# Patient Record
Sex: Female | Born: 1940
Health system: Southern US, Community
[De-identification: ages and names within clinical notes are randomized; demographics above are authoritative.]

## PROBLEM LIST (undated history)

## (undated) DIAGNOSIS — N189 Chronic kidney disease, unspecified: Secondary | ICD-10-CM

## (undated) DIAGNOSIS — L309 Dermatitis, unspecified: Secondary | ICD-10-CM

## (undated) DIAGNOSIS — K5790 Diverticulosis of intestine, part unspecified, without perforation or abscess without bleeding: Secondary | ICD-10-CM

## (undated) DIAGNOSIS — F988 Other specified behavioral and emotional disorders with onset usually occurring in childhood and adolescence: Secondary | ICD-10-CM

## (undated) DIAGNOSIS — E785 Hyperlipidemia, unspecified: Secondary | ICD-10-CM

## (undated) DIAGNOSIS — R251 Tremor, unspecified: Secondary | ICD-10-CM

## (undated) DIAGNOSIS — N183 Chronic kidney disease, stage 3 unspecified: Secondary | ICD-10-CM

## (undated) DIAGNOSIS — U071 COVID-19: Secondary | ICD-10-CM

## (undated) DIAGNOSIS — G43909 Migraine, unspecified, not intractable, without status migrainosus: Secondary | ICD-10-CM

## (undated) DIAGNOSIS — K219 Gastro-esophageal reflux disease without esophagitis: Secondary | ICD-10-CM

## (undated) DIAGNOSIS — M1990 Unspecified osteoarthritis, unspecified site: Secondary | ICD-10-CM

## (undated) DIAGNOSIS — G459 Transient cerebral ischemic attack, unspecified: Secondary | ICD-10-CM

## (undated) DIAGNOSIS — F419 Anxiety disorder, unspecified: Secondary | ICD-10-CM

## (undated) DIAGNOSIS — M797 Fibromyalgia: Secondary | ICD-10-CM

## (undated) DIAGNOSIS — F32A Depression, unspecified: Secondary | ICD-10-CM

## (undated) DIAGNOSIS — J45909 Unspecified asthma, uncomplicated: Secondary | ICD-10-CM

## (undated) DIAGNOSIS — G250 Essential tremor: Secondary | ICD-10-CM

## (undated) DIAGNOSIS — N2889 Other specified disorders of kidney and ureter: Secondary | ICD-10-CM

## (undated) DIAGNOSIS — I10 Essential (primary) hypertension: Secondary | ICD-10-CM

## (undated) DIAGNOSIS — R413 Other amnesia: Principal | ICD-10-CM

## (undated) DIAGNOSIS — F329 Major depressive disorder, single episode, unspecified: Secondary | ICD-10-CM

## (undated) DIAGNOSIS — J189 Pneumonia, unspecified organism: Secondary | ICD-10-CM

## (undated) DIAGNOSIS — K529 Noninfective gastroenteritis and colitis, unspecified: Secondary | ICD-10-CM

## (undated) HISTORY — DX: Unspecified osteoarthritis, unspecified site: M19.90

## (undated) HISTORY — DX: Transient cerebral ischemic attack, unspecified: G45.9

## (undated) HISTORY — DX: Other specified behavioral and emotional disorders with onset usually occurring in childhood and adolescence: F98.8

## (undated) HISTORY — DX: Dermatitis, unspecified: L30.9

## (undated) HISTORY — DX: COVID-19: U07.1

## (undated) HISTORY — DX: Anxiety disorder, unspecified: F41.9

## (undated) HISTORY — PX: TONSILLECTOMY AND ADENOIDECTOMY: SUR1326

## (undated) HISTORY — DX: Tremor, unspecified: R25.1

## (undated) HISTORY — PX: CATARACT EXTRACTION: SUR2

## (undated) HISTORY — DX: Essential tremor: G25.0

## (undated) HISTORY — DX: Other specified disorders of kidney and ureter: N28.89

## (undated) HISTORY — PX: ABDOMINAL HYSTERECTOMY: SHX81

## (undated) HISTORY — DX: Chronic kidney disease, stage 3 unspecified: N18.30

## (undated) HISTORY — DX: Chronic kidney disease, stage 3 (moderate): N18.3

## (undated) HISTORY — DX: Other amnesia: R41.3

## (undated) HISTORY — DX: Pneumonia, unspecified organism: J18.9

## (undated) HISTORY — PX: APPENDECTOMY: SHX54

---

## 1999-09-02 ENCOUNTER — Encounter: Admission: RE | Admit: 1999-09-02 | Discharge: 1999-09-02 | Payer: Self-pay | Admitting: Internal Medicine

## 1999-09-02 ENCOUNTER — Encounter: Payer: Self-pay | Admitting: Internal Medicine

## 1999-09-28 ENCOUNTER — Encounter: Payer: Self-pay | Admitting: Internal Medicine

## 1999-09-28 ENCOUNTER — Encounter: Admission: RE | Admit: 1999-09-28 | Discharge: 1999-09-28 | Payer: Self-pay | Admitting: Internal Medicine

## 2000-05-29 ENCOUNTER — Encounter: Admission: RE | Admit: 2000-05-29 | Discharge: 2000-05-29 | Payer: Self-pay | Admitting: Internal Medicine

## 2000-05-29 ENCOUNTER — Encounter: Payer: Self-pay | Admitting: Internal Medicine

## 2000-05-29 ENCOUNTER — Encounter (INDEPENDENT_AMBULATORY_CARE_PROVIDER_SITE_OTHER): Payer: Self-pay | Admitting: *Deleted

## 2000-08-10 ENCOUNTER — Ambulatory Visit (HOSPITAL_COMMUNITY): Admission: RE | Admit: 2000-08-10 | Discharge: 2000-08-10 | Payer: Self-pay | Admitting: Obstetrics and Gynecology

## 2000-08-10 ENCOUNTER — Encounter: Payer: Self-pay | Admitting: Obstetrics and Gynecology

## 2000-10-02 ENCOUNTER — Encounter: Payer: Self-pay | Admitting: Internal Medicine

## 2000-10-02 ENCOUNTER — Encounter: Admission: RE | Admit: 2000-10-02 | Discharge: 2000-10-02 | Payer: Self-pay | Admitting: Internal Medicine

## 2000-10-05 ENCOUNTER — Emergency Department (HOSPITAL_COMMUNITY): Admission: EM | Admit: 2000-10-05 | Discharge: 2000-10-05 | Payer: Self-pay | Admitting: Emergency Medicine

## 2000-10-05 ENCOUNTER — Encounter: Payer: Self-pay | Admitting: Emergency Medicine

## 2001-05-22 ENCOUNTER — Encounter (INDEPENDENT_AMBULATORY_CARE_PROVIDER_SITE_OTHER): Payer: Self-pay | Admitting: *Deleted

## 2001-05-22 ENCOUNTER — Ambulatory Visit (HOSPITAL_COMMUNITY): Admission: RE | Admit: 2001-05-22 | Discharge: 2001-05-22 | Payer: Self-pay | Admitting: Gastroenterology

## 2001-10-05 ENCOUNTER — Encounter: Admission: RE | Admit: 2001-10-05 | Discharge: 2001-10-05 | Payer: Self-pay | Admitting: Internal Medicine

## 2001-10-05 ENCOUNTER — Encounter: Payer: Self-pay | Admitting: Internal Medicine

## 2002-11-18 ENCOUNTER — Encounter: Payer: Self-pay | Admitting: Obstetrics and Gynecology

## 2002-11-18 ENCOUNTER — Encounter: Admission: RE | Admit: 2002-11-18 | Discharge: 2002-11-18 | Payer: Self-pay | Admitting: Obstetrics and Gynecology

## 2003-07-03 ENCOUNTER — Encounter (INDEPENDENT_AMBULATORY_CARE_PROVIDER_SITE_OTHER): Payer: Self-pay | Admitting: *Deleted

## 2003-07-03 ENCOUNTER — Encounter: Admission: RE | Admit: 2003-07-03 | Discharge: 2003-07-03 | Payer: Self-pay | Admitting: Internal Medicine

## 2003-08-12 ENCOUNTER — Other Ambulatory Visit: Admission: RE | Admit: 2003-08-12 | Discharge: 2003-08-12 | Payer: Self-pay | Admitting: Obstetrics and Gynecology

## 2003-12-22 ENCOUNTER — Ambulatory Visit (HOSPITAL_COMMUNITY): Admission: RE | Admit: 2003-12-22 | Discharge: 2003-12-22 | Payer: Self-pay | Admitting: Internal Medicine

## 2004-08-19 ENCOUNTER — Other Ambulatory Visit: Admission: RE | Admit: 2004-08-19 | Discharge: 2004-08-19 | Payer: Self-pay | Admitting: Obstetrics and Gynecology

## 2004-12-27 ENCOUNTER — Ambulatory Visit (HOSPITAL_COMMUNITY): Admission: RE | Admit: 2004-12-27 | Discharge: 2004-12-27 | Payer: Self-pay | Admitting: Internal Medicine

## 2005-01-14 ENCOUNTER — Encounter (INDEPENDENT_AMBULATORY_CARE_PROVIDER_SITE_OTHER): Payer: Self-pay | Admitting: *Deleted

## 2005-08-22 ENCOUNTER — Other Ambulatory Visit: Admission: RE | Admit: 2005-08-22 | Discharge: 2005-08-22 | Payer: Self-pay | Admitting: Obstetrics and Gynecology

## 2005-10-26 ENCOUNTER — Encounter: Admission: RE | Admit: 2005-10-26 | Discharge: 2005-10-26 | Payer: Self-pay | Admitting: Orthopedic Surgery

## 2005-12-19 ENCOUNTER — Encounter: Admission: RE | Admit: 2005-12-19 | Discharge: 2005-12-19 | Payer: Self-pay | Admitting: Orthopedic Surgery

## 2005-12-29 ENCOUNTER — Ambulatory Visit (HOSPITAL_COMMUNITY): Admission: RE | Admit: 2005-12-29 | Discharge: 2005-12-29 | Payer: Self-pay | Admitting: Internal Medicine

## 2006-01-18 ENCOUNTER — Encounter: Admission: RE | Admit: 2006-01-18 | Discharge: 2006-01-18 | Payer: Self-pay | Admitting: Orthopedic Surgery

## 2006-11-02 ENCOUNTER — Other Ambulatory Visit: Admission: RE | Admit: 2006-11-02 | Discharge: 2006-11-02 | Payer: Self-pay | Admitting: Obstetrics and Gynecology

## 2007-01-03 ENCOUNTER — Ambulatory Visit (HOSPITAL_COMMUNITY): Admission: RE | Admit: 2007-01-03 | Discharge: 2007-01-03 | Payer: Self-pay | Admitting: Obstetrics and Gynecology

## 2007-02-15 HISTORY — PX: TOTAL HIP ARTHROPLASTY: SHX124

## 2007-08-06 ENCOUNTER — Inpatient Hospital Stay (HOSPITAL_COMMUNITY): Admission: RE | Admit: 2007-08-06 | Discharge: 2007-08-09 | Payer: Self-pay | Admitting: Orthopedic Surgery

## 2007-10-30 ENCOUNTER — Ambulatory Visit (HOSPITAL_COMMUNITY): Admission: RE | Admit: 2007-10-30 | Discharge: 2007-10-30 | Payer: Self-pay | Admitting: Orthopedic Surgery

## 2007-12-18 ENCOUNTER — Inpatient Hospital Stay (HOSPITAL_COMMUNITY): Admission: RE | Admit: 2007-12-18 | Discharge: 2007-12-21 | Payer: Self-pay | Admitting: Orthopedic Surgery

## 2008-01-24 ENCOUNTER — Ambulatory Visit (HOSPITAL_COMMUNITY): Admission: RE | Admit: 2008-01-24 | Discharge: 2008-01-24 | Payer: Self-pay | Admitting: Obstetrics and Gynecology

## 2008-01-29 ENCOUNTER — Other Ambulatory Visit: Admission: RE | Admit: 2008-01-29 | Discharge: 2008-01-29 | Payer: Self-pay | Admitting: Obstetrics and Gynecology

## 2008-11-17 ENCOUNTER — Encounter: Admission: RE | Admit: 2008-11-17 | Discharge: 2008-11-17 | Payer: Self-pay | Admitting: Internal Medicine

## 2009-01-19 ENCOUNTER — Encounter: Admission: RE | Admit: 2009-01-19 | Discharge: 2009-01-19 | Payer: Self-pay | Admitting: Neurology

## 2009-01-26 ENCOUNTER — Ambulatory Visit (HOSPITAL_COMMUNITY): Admission: RE | Admit: 2009-01-26 | Discharge: 2009-01-26 | Payer: Self-pay | Admitting: Obstetrics and Gynecology

## 2009-03-05 ENCOUNTER — Ambulatory Visit: Payer: Self-pay | Admitting: Psychology

## 2009-03-10 ENCOUNTER — Other Ambulatory Visit: Admission: RE | Admit: 2009-03-10 | Discharge: 2009-03-10 | Payer: Self-pay | Admitting: Obstetrics and Gynecology

## 2009-09-21 ENCOUNTER — Encounter (INDEPENDENT_AMBULATORY_CARE_PROVIDER_SITE_OTHER): Payer: Self-pay | Admitting: *Deleted

## 2009-11-30 ENCOUNTER — Telehealth (INDEPENDENT_AMBULATORY_CARE_PROVIDER_SITE_OTHER): Payer: Self-pay | Admitting: *Deleted

## 2009-11-30 ENCOUNTER — Encounter (INDEPENDENT_AMBULATORY_CARE_PROVIDER_SITE_OTHER): Payer: Self-pay | Admitting: *Deleted

## 2009-12-08 ENCOUNTER — Ambulatory Visit: Payer: Self-pay | Admitting: Internal Medicine

## 2009-12-08 DIAGNOSIS — Z860100 Personal history of colon polyps, unspecified: Secondary | ICD-10-CM | POA: Insufficient documentation

## 2009-12-08 DIAGNOSIS — F341 Dysthymic disorder: Secondary | ICD-10-CM | POA: Insufficient documentation

## 2009-12-08 DIAGNOSIS — K219 Gastro-esophageal reflux disease without esophagitis: Secondary | ICD-10-CM | POA: Insufficient documentation

## 2010-01-28 ENCOUNTER — Ambulatory Visit (HOSPITAL_COMMUNITY)
Admission: RE | Admit: 2010-01-28 | Discharge: 2010-01-28 | Payer: Self-pay | Source: Home / Self Care | Attending: Obstetrics and Gynecology | Admitting: Obstetrics and Gynecology

## 2010-03-06 ENCOUNTER — Encounter: Payer: Self-pay | Admitting: Orthopedic Surgery

## 2010-03-11 ENCOUNTER — Other Ambulatory Visit: Payer: Self-pay | Admitting: Obstetrics and Gynecology

## 2010-03-11 ENCOUNTER — Other Ambulatory Visit (HOSPITAL_COMMUNITY)
Admission: RE | Admit: 2010-03-11 | Discharge: 2010-03-11 | Disposition: A | Discharge status: Home / Self Care | Payer: Medicare Other | Source: Ambulatory Visit | Attending: Obstetrics and Gynecology | Admitting: Obstetrics and Gynecology

## 2010-03-11 DIAGNOSIS — Z124 Encounter for screening for malignant neoplasm of cervix: Secondary | ICD-10-CM | POA: Insufficient documentation

## 2010-03-16 NOTE — Letter (Signed)
Summary: New Patient letter  [facility]  [address] [place], [place] [address]   Phone: [phone]  Fax: [phone]       09/21/2009 MRN: [id number]  [facility] [address] [place], [place]  [address]  Dear Ms. Urich,  Welcome to the Gastroenterology Division at [facility].    You are scheduled to see Dr. Juanda Chance on 12/08/2009 at 1:30PM on the 3rd floor at [facility], [address]  We ask that you try to arrive at our office 15 minutes prior to your appointment time to allow for check-in.  We would like you to complete the enclosed self-administered evaluation form prior to your visit and bring it with you on the day of your appointment.  We will review it with you.  Also, please bring a complete list of all your medications or, if you prefer, bring the medication bottles and we will list them.  Please bring your insurance card so that we may make a copy of it.  If your insurance requires a referral to see a specialist, please bring your referral form from your primary care physician.  Co-payments are due at the time of your visit and may be paid by cash, check or credit card.     Your office visit will consist of a consult with your physician (includes a physical exam), any laboratory testing he/she may order, scheduling of any necessary diagnostic testing (e.g. x-ray, ultrasound, CT-scan), and scheduling of a procedure (e.g. Endoscopy, Colonoscopy) if required.  Please allow enough time on your schedule to allow for any/all of these possibilities.    If you cannot keep your appointment, please call [phone] to cancel or reschedule prior to your appointment date.  This allows [place] the opportunity to schedule an appointment for another patient in need of care.  If you do not cancel or reschedule by 5 p.m. the business day prior to your appointment date, you will be charged a $50.00 late cancellation/no-show fee.    Thank you for choosing  [facility] for your medical needs.  We appreciate the opportunity to care for you.  Please visit [place] at our website  to learn more about our practice.                     Sincerely,                                                             The Gastroenterology Division

## 2010-03-16 NOTE — Assessment & Plan Note (Signed)
Summary: ESTABLISH NEW GI...AS.   History of Present Illness Visit Type: new patient  Primary GI MD: Lina Sar MD Primary Provider: Georgann Housekeeper, MD  Requesting Provider: na Chief Complaint: Dysphagia and food gets stuck when patient swallows.  History of Present Illness:   This is a 70 year old white female who is here to discuss having a recall colonoscopy. She has a history of adenomatous polyps of the colon in 2003. Her last colonoscopy in December 2006 did not show any polyps. She has diverticulosis which was confirmed on a CT scan of the abdomen in May 2005. It also showed a slightly distended cecum. Since surgery for bilateral hip replacements in 2009, she has been hoarse and her voice has been  raspy. She is unable to sing in church. An abdominal ultrasound in 2002 showed tiny liver cysts. She has occasional dysphagia. Patient has taken pantoprazole 40 mg daily. She has a history of depression since childhood and fibromyalgia for which she takes Pristique, Cymbalta, Adderall and Lyrica.   GI Review of Systems    Reports acid reflux, dysphagia with solids, and  heartburn.      Denies abdominal pain, belching, bloating, chest pain, dysphagia with liquids, loss of appetite, nausea, vomiting, vomiting blood, weight loss, and  weight gain.      Reports constipation, diverticulosis, hemorrhoids, and  irritable bowel syndrome.     Denies anal fissure, black tarry stools, change in bowel habit, diarrhea, fecal incontinence, heme positive stool, jaundice, light color stool, liver problems, rectal bleeding, and  rectal pain.    Current Medications (verified): 1)  Enalapril Maleate 10 Mg Tabs (Enalapril Maleate) .... One Tablet By Mouth Once Daily 2)  Pristiq 50 Mg Xr24h-Tab (Desvenlafaxine Succinate) .... One Tablet By Mouth Once Daily 3)  Hydrocodone-Acetaminophen 7.5-500 Mg Tabs (Hydrocodone-Acetaminophen) .... One Tablet By Mouth Two Times A Day As Needed For Pain 4)  Celebrex 200 Mg  Caps (Celecoxib) .... One Tablet By Mouth Once Daily 5)  Sumatriptan Succinate 50 Mg Tabs (Sumatriptan Succinate) .... One Tablet By Mouth Two Times A Day As Needed 6)  Cymbalta 30 Mg Cpep (Duloxetine Hcl) .... One Capsule By Mouth Three Times A Day 7)  Pantoprazole Sodium 40 Mg Tbec (Pantoprazole Sodium) .... One Tablet By Mouth Once Daily 8)  Lyrica 75 Mg Caps (Pregabalin) .... One Capsule By Mouth Three Times A Day 9)  Lorazepam 1 Mg Tabs (Lorazepam) .... One Tablet By Mouth At Bedtime 10)  Adderall Xr 10 Mg Xr24h-Cap (Amphetamine-Dextroamphetamine) .... One Tablet By Mouth Once Daily 11)  Alga([phone]) .... Orally Once Daily 12)  Melatonin 3 Mg Tabs (Melatonin) .... One By Mouth At Bedtime As Needed 13)  B Complex  Tabs (B Complex Vitamins) .... One Tablet By Mouth Once Daily 14)  Gnp Cinnamon 500 Mg Caps (Cinnamon) .... One Capsule By Mouth Once Daily 15)  Triamcinolone Acetonide 0.025 % Crea (Triamcinolone Acetonide) .... As Directed 16)  Vitamin D 2000 Unit Tabs (Cholecalciferol) .... One Tablet By Mouth Once Daily 17)  Flonase 50 Mcg/act Susp (Fluticasone Propionate) .... As Needed 18)  Fish Oil 1000 Mg Caps (Omega-3 Fatty Acids) .... Four Capsules By Mouth Once Daily  Allergies (verified): 1)  ! Erythromycin  Past History:  Past Medical History: Anal Fissure Anemia-Iron Def Anxiety Disorder Arthritis Asthma Chronic Headaches Adenomatous Colon Polyps Depression Diverticulosis Esophageal Stricture Fibromyalgia GERD Hyperlipidemia No Visual Memory  Hypertension Irritable Bowel Syndrome Pneumonia Urinary Tract Infection ADD   Past Surgical History: Appendectomy Hip Replacement Bilateral  Hysterectomy Tonsillectomy  Family History: No FH of Colon Cancer: Family History of Breast Cancer:Maternal Aunts x 2  Family History of Prostate Cancer: Father and Paternal Uncles x 2  Family History of Clotting disorder: Mother  Family History of Diabetes: Paternal  Aunt Family History of Heart Disease: PGM, PGF, and MGF Stroke: MGM   Social History: Retired Married Childern Patient has never smoked.  Alcohol Use - no Daily Caffeine Use: 2-3 daily  Illicit Drug Use - no Smoking Status:  never Drug Use:  no  Review of Systems       The patient complains of anxiety-new, arthritis/joint pain, change in vision, confusion, depression-new, fatigue, headaches-new, itching, muscle pains/cramps, skin rash, sleeping problems, urine leakage, and voice change.  The patient denies allergy/sinus, anemia, back pain, blood in urine, breast changes/lumps, cough, coughing up blood, fainting, fever, hearing problems, heart murmur, heart rhythm changes, menstrual pain, night sweats, nosebleeds, pregnancy symptoms, shortness of breath, sore throat, swelling of feet/legs, swollen lymph glands, thirst - excessive, urination - excessive, urination changes/pain, and vision changes.         Pertinent positive and negative review of systems were noted in the above HPI. All other ROS was otherwise negative.   Vital Signs:  Patient profile:   70 year old female Height:      64 inches Weight:      161 pounds BMI:     27.74 BSA:     1.79 Pulse rate:   88 / minute Pulse rhythm:   regular BP sitting:   132 / 76  (left arm) Cuff size:   regular  Vitals Entered By: Ok Anis CMA (December 08, 2009 1:39 PM)  Physical Exam  General:  Well developed, well nourished, no acute distress,wearing a wig. Eyes:  PERRLA, no icterus. Mouth:  No deformity or lesions, dentition normal. Neck:  Supple; no masses or thyromegaly. Lungs:  Clear throughout to auscultation. Heart:  Regular rate and rhythm; no murmurs, rubs,  or bruits. Abdomen:  soft abdomen with normoactive bowel sounds. No tenderness. No distention or tympany. Liver edge at costal margin. Rectal:  soft Hemoccult negative stool. Extremities:  No clubbing, cyanosis, edema or deformities noted. Skin:  Intact without  significant lesions or rashes. Psych:  Alert and cooperative. Normal mood and affect.   Impression & Recommendations:  Problem # 1:  GERD (ICD-530.81)  Patient is on Protonix 40 mg daily. She has had hoarseness since the general anesthesia several years ago. We need to rule out gastroesophageal reflux induced LPR. ? injury to the vocal cords?  Orders: Colon/Endo (Colon/Endo)  Problem # 2:  COLONIC POLYPS, ADENOMATOUS, HX OF (ICD-V12.72)  Patient's last colonoscopy was in 2006. She is due for a recall colonoscopy. We will schedule,  Miralax prep..  Orders: Colon/Endo (Colon/Endo)  Problem # 3:  ANXIETY DEPRESSION (ICD-300.4) Patient is on multiple psychotropic medications. All the medications may accenuate gastroesophageal reflux by decreasing esophageal motility and delaying gastric emptyuing.  Patient Instructions: 1)  You have been scheduled for an endoscopy/colonoscopy on 01/19/10.  2)  Please pick up your Miralax, Reglan and Dulcolax at the pharmacy 3)  Continue Protonix 40 mg daily. 4)  Copy sent to : Georgann Housekeeper, MD  5)  The medication list was reviewed and reconciled.  All changed / newly prescribed medications were explained.  A complete medication list was provided to the patient / caregiver. Prescriptions: DULCOLAX 5 MG  TBEC (BISACODYL) Day before procedure take 2 at 3pm and 2 at  8pm.  #4 x 0   Entered by:   Lamona Curl CMA (AAMA)   Authorized by:   Hart Carwin MD   Signed by:   Lamona Curl CMA (AAMA) on 12/08/2009   Method used:   [facility] to        [facility] [phone] (retail)       [address]       [place], [place]  [address]       Ph: [phone] or [phone]       Fax: [phone]   RxID:   [id number] REGLAN 10 MG  TABS (METOCLOPRAMIDE HCL) As per prep instructions.  #2 x 0   Entered by:   Lamona Curl CMA (AAMA)   Authorized by:   Hart Carwin MD   Signed by:   Lamona Curl  CMA (AAMA) on 12/08/2009   Method used:   Electronically to        [facility] [phone][facility] (retail)       [address]       [place], [place]  [address]       Ph: [phone] or [phone]       Fax: [phone]   RxID:   [id number] MIRALAX   POWD (POLYETHYLENE GLYCOL 3350) As per prep  instructions.  #255gm x 0   Entered by:   Lamona Curl CMA (AAMA)   Authorized by:   Hart Carwin MD   Signed by:   Lamona Curl CMA (AAMA) on 12/08/2009   Method used:   Electronically to        [facility] [phone]* (retail)       [address]       [place], [place]  [address]       Ph: [phone] or [phone]       Fax: [phone]   RxID:   [id number]

## 2010-03-16 NOTE — Letter (Signed)
Summary: Dr Donette Larry Office Note  Dr Donette Larry Office Note   Imported By: Lamona Curl CMA (AAMA) 12/04/2009 17:12:55  _____________________________________________________________________  External Attachment:    Type:   Image     Comment:   External Document

## 2010-03-16 NOTE — Letter (Signed)
Summary: COLON (DR [facility])  COLON (DR [facility])   Imported By: Lamona Curl CMA (AAMA) 12/04/2009 17:11:22  _____________________________________________________________________  External Attachment:    Type:   Image     Comment:   External Document

## 2010-03-16 NOTE — Letter (Signed)
Summary: [facility] Instructions  [facility]  [address] [place], [place] [address]   Phone: [phone]  Fax: [phone]       [name]    1940/05/24    MRN: [id number]       Procedure Day /Date: Tuesday 01/19/10     Arrival Time: 12:30 pm     Procedure Time: 1:30 pm     Location of Procedure:                    _x_  [facility] (4th Floor)  PREPARATION FOR COLONOSCOPY WITH MIRALAX  Starting 5 days prior to your procedure 01/14/10 do not eat nuts, seeds, popcorn, corn, beans, peas,  salads, or any raw vegetables.  Do not take any fiber supplements (e.g. Metamucil, Citrucel, and Benefiber). ____________________________________________________________________________________________________   THE DAY BEFORE YOUR PROCEDURE         DATE: 01/18/10 DAY: Monday  1   Drink clear liquids the entire day-NO SOLID FOOD  2   Do not drink anything colored red or purple.  Avoid juices with pulp.  No orange juice.  3   Drink at least 64 oz. (8 glasses) of fluid/clear liquids during the day to prevent dehydration and help the prep work efficiently.  CLEAR LIQUIDS INCLUDE: Water Jello Ice Popsicles Tea (sugar ok, no milk/cream) Powdered fruit flavored drinks Coffee (sugar ok, no milk/cream) Gatorade Juice: apple, white grape, white cranberry  Lemonade Clear bullion, consomm, broth Carbonated beverages (any kind) Strained chicken noodle soup Hard Candy  4   Mix the entire bottle of Miralax with 64 oz. of Gatorade/Powerade in the morning and put in the refrigerator to chill.  5   At 3:00 pm take 2 Dulcolax/Bisacodyl tablets.  6   At 4:30 pm take one Reglan/Metoclopramide tablet.  7  Starting at 5:00 pm drink one 8 oz glass of the Miralax mixture every 15-20 minutes until you have finished drinking the entire 64 oz.  You should finish drinking prep around 7:30 or 8:00 pm.  8   If you are nauseated, you may take the 2nd Reglan/Metoclopramide tablet  at 6:30 pm.        9    At 8:00 pm take 2 more DULCOLAX/Bisacodyl tablets.         THE DAY OF YOUR PROCEDURE      DATE:  01/19/10 DAY: Tuesday  You may drink clear liquids until 11:30 am  (2 HOURS BEFORE PROCEDURE).   MEDICATION INSTRUCTIONS  Unless otherwise instructed, you should take regular prescription medications with a small sip of water as early as possible the morning of your procedure.        OTHER INSTRUCTIONS  You will need a responsible adult at least 68 years of age to accompany you and drive you home.   This person must remain in the waiting room during your procedure.  Wear loose fitting clothing that is easily removed.  Leave jewelry and other valuables at home.  However, you may wish to bring a book to read or an iPod/MP3 player to listen to music as you wait for your procedure to start.  Remove all body piercing jewelry and leave at home.  Total time from sign-in until discharge is approximately 2-3 hours.  You should go home directly after your procedure and rest.  You can resume normal activities the day after your procedure.  The day of your procedure you should not:   Drive   Make  legal decisions   Operate machinery   Drink alcohol   Return to work  You will receive specific instructions about eating, activities and medications before you leave.   The above instructions have been reviewed and explained to me by   Lamona Curl CMA Duncan Dull)  December 08, 2009 2:43 PM     I fully understand and can verbalize these instructions _____________________________ Date 12/08/09

## 2010-03-16 NOTE — Progress Notes (Signed)
  Phone Note Other Incoming   Request: Send information Summary of Call: Request for records received from Dr. Donette Larry with [facility]. Request forwarded to [employer].

## 2010-05-12 ENCOUNTER — Telehealth: Payer: Self-pay | Admitting: Internal Medicine

## 2010-05-12 NOTE — Telephone Encounter (Signed)
Spoke with patient and she states she has been on a low carb diet and lost 30 lbs. She is now constipated. She took Exlax and stool softners without any success. She took a dose of Miralax and started having multiple stools that were almost diarrhea. She stopped taking anything and now it has been a week since last bowel movement. She took 4 Exlax yesterday and had one small bowel movement. Suggested to patient she try taking Miralax daily in AM if no bowel movement that day repeat a dose of Miralax in PM. Suggested she not take Exlax but try this. Explained it is better to have a routine than to "play catch up." Also, encouraged patient to increase her water intake. She states she will do this. Patient then asked about scheduling a colonoscopy. States she cancelled the one in December because the last colonoscopy/EGD she had caused her to have diarrhea for 3 weeks and she did not want this to happen at Christmas and she states she also changed her insurance and had to wait 3 months to reschedule. Is it okay to r/s her colonoscopyEGD? Please,advise

## 2010-05-12 NOTE — Telephone Encounter (Signed)
OK to reschedule direct EGD/colon

## 2010-05-13 NOTE — Telephone Encounter (Signed)
Spoke with patient and she has decided to wait to schedule this later because her husband has health issues she needs to deal with currently.

## 2010-06-29 NOTE — H&P (Signed)
[name], [name]             ACCOUNT NO.:  [id number]   MEDICAL RECORD NO.:  [id number]         PATIENT TYPE:  LINP   LOCATION:                               FACILITY:  [facility]   PHYSICIAN:  Madlyn Frankel. Charlann Boxer, M.D.  DATE OF BIRTH:  22-Jun-1940   DATE OF ADMISSION:  12/18/2007  DATE OF DISCHARGE:                              HISTORY & PHYSICAL   PRIMARY CARE PHYSICIAN:  Georgann Housekeeper, MD.   PROCEDURE:  Right total hip replacement.   CHIEF COMPLAINT:  Right hip pain.   HISTORY OF PRESENT ILLNESS:  A 70 year old female with a history of  right hip pain secondary to osteoarthritis refractory to all  conservative treatment.  She does have a recent history of left total  hip replacement and has done very well.   PAST MEDICAL HISTORY:  1. Osteoarthritis.  2. Fibromyalgia.  3. Depression.  4. Migraines.  5. Hypertension.  6. Reflux disease.  7. Dyslipidemia.   PAST SURGICAL HISTORY:  1. Left total hip replacement.  2. Appendectomy.  3. Tonsillectomy.  4. Partial hysterectomy.   FAMILY MEDICAL HISTORY:  Heart disease, hypertension, prostate cancer,  stroke, arthritis, blood clots.   SOCIAL HISTORY:  Married.  Primary caregiver will be husband in the  home.   DRUG ALLERGIES:  1. SULFA DRUGS.  2. NICKEL ALLERGIES.   MEDICATIONS:  1. Tizanidine 4 mg p.o. q.h.s.  2. Fentanyl 12 mcg patch, change every 3 days.  3. Lyrica 75 mg 1 p.o. t.i.d.  4. Lidoderm 5% patch q.h.s.  5. Pantoprazole 40 mg p.o. q.a.m.  6. Cymbalta 60 mg 1 p.o. q.a.m. and 1 p.o. mid afternoon.  7. Celebrex 200 mg 1 p.o. daily.  8. Alendronate 70 mg 1 q. weekly.  9. Enalapril 10 mg 1 p.o. q.a.m.  10.Ativan 1 mg 1 p.o. q.h.s. p.r.n.  11.Imitrex 50 mg tablet p.r.n. for migraines.  12.Lunesta 2 mg 1 p.o. q.h.s.   REVIEW OF SYSTEMS:  See HPI.   PHYSICAL EXAMINATION:  VITAL SIGNS:  Pulse 72, respirations 16, blood  pressure 128/84.  GENERAL:  Awake, alert and oriented, well-developed, well-nourished.  NECK:  Supple.  No carotid bruits.  CHEST/LUNGS:  Clear to auscultation bilaterally.  BREASTS:  Deferred.  HEART:  Regular rate and rhythm, S1 and S2 distinct.  ABDOMEN:  Soft, nontender and nondistended.  Bowel sounds are present.  GENITOURINARY:  Deferred.  EXTREMITIES:  Right hip has limited range of motion with increased pain.  SKIN:  No cellulitis.  NEUROLOGIC:  Intact to distal sensibilities.   LABORATORY DATA:  Labs, EKG, chest x-ray are all pending pre-surgical  testing.  Chest x-ray and EKG should have been done in the past 6 months  as she has had previous left hip replacement.   IMPRESSION:  Right hip osteoarthritis.   PLAN OF ACTION:  A right total hip replacement on December 18, 2007 at  [facility] with Dr. Lajoyce Corners.  Risks and complications were  discussed.   Postoperative medication including Lovenox, Robaxin, iron, aspirin,  Colace and MiraLax were provided at time of H&P.  Pain medicines will be  prescribed at time of surgery.     ______________________________  Yetta Glassman [address], [place]      Madlyn Frankel. Charlann Boxer, M.D.  Electronically Signed    BLM/MEDQ  D:  12/12/2007  T:  12/12/2007  Job:  [id number]   cc:   Georgann Housekeeper, MD  Fax: [phone]

## 2010-06-29 NOTE — H&P (Signed)
[name], [name]              ACCOUNT NO.:  [id number]   MEDICAL RECORD NO.:  [id number]         PATIENT TYPE:  INP   LOCATION:                               FACILITY:  [facility]   PHYSICIAN:  Madlyn Frankel. Charlann Boxer, M.D.  DATE OF BIRTH:  05-20-1940   DATE OF ADMISSION:  08/06/2007  DATE OF DISCHARGE:                              HISTORY & PHYSICAL   PROCEDURE PERFORMED:  Procedure will be a left total hip arthroplasty.   CHIEF COMPLAINT:  Left hip pain.   HISTORY OF PRESENT ILLNESS:  A 70 year old female with a history of left  hip pain secondary to osteoarthritis that has been refractory to all  conservative treatment.  She has been presurgically assessed prior to  surgery.   PRIMARY CARE PHYSICIAN:  Dr. Georgann Housekeeper.   PAST MEDICAL HISTORY:  Significant for:  1. Osteoarthritis.  2. Fibromyalgia.  3. Anxiety/depression.  4. Migraine headaches.  5. Hypertension.  6. Reflux disease.  7. Dyslipidemia.   PAST SURGICAL HISTORY:  Appendectomy, tonsillectomy, partial  hysterectomy.   FAMILY MEDICAL HISTORY:  Includes heart disease, hypertension, prostate  cancer, stroke, arthritis.  Mother had blood clots.   SOCIAL HISTORY:  She is married.  Primary caregiver will be her husband  at home after surgery.   DRUG ALLERGIES:  Sulfa drugs and nickel allergies.   MEDICATIONS:  1. Hydrocodone APAP 7.5/750 one p.o. q.a.m. and one p.o. q.p.m.  2. Tizanidine 4 mg p.o. q.h.s.  3. Fentanyl 12 mcg patch, change every 3 days.  4. Lyrica 75 mg one p.o. t.i.d.  5. Lidoderm 5% patch one at bedtime.  6. Pantoprazole 40 mg one p.o. q.a.m.  7. Cymbalta 60 mg p.o. q.a.m. and 60 mg mid afternoon.  8. Celebrex 200 mg 1 p.o. daily.  9. Alendronate 70 mg 1 weekly on Sunday.  10.Enalapril 10 mg 1 p.o. q.a.m.  11.Ativan 1 mg 1 p.o. q.h.s. p.r.n.  12.Imitrex 50 mg tablet p.r.n. for migraines.  13.Lunesta 2 mg 1 p.o. q.h.s.   REVIEW OF SYSTEMS:  See HPI.   PHYSICAL EXAM:  VITAL SIGNS:  Pulse 72,  respirations 18, blood pressure  108/76.  GENERAL:  Awake, alert and oriented, anxious.  NECK: Supple.  No carotid bruits.  CHEST:  Lungs are clear to auscultation bilaterally.  BREASTS:  Deferred.  HEART:  Regular rate and rhythm.  S1 and S2 distinct.  ABDOMEN:  Soft, nontender, nondistended.  Bowel sounds present.  GENITOURINARY:  Deferred.  EXTREMITIES:  Left hip has limited range of motion with increased pain.  SKIN:  No cellulitis.  NEUROLOGIC:  Intact distal sensibilities.   Labs, EKG, and chest x-ray all pending, presurgical testing.   IMPRESSION:  Left hip osteoarthritis.   PLAN:  Left total hip arthroplasty 08/06/2007 by surgeon, Dr. Durene Romans at [facility].  Risks and complications were discussed.   No postoperative medications were given at time of history and physical  history as there is slight potential for need for rehab facility  postoperatively.     ______________________________  Yetta Glassman [address], [place]      Madlyn Frankel.  Charlann Boxer, M.D.  Electronically Signed    BLM/MEDQ  D:  08/02/2007  T:  08/02/2007  Job:  [id number]   cc:   Georgann Housekeeper, MD  Fax: [phone]

## 2010-06-29 NOTE — Op Note (Signed)
[name], [name]             ACCOUNT NO.:  [id number]   MEDICAL RECORD NO.:  [id number]          PATIENT TYPE:  INP   LOCATION:  1602                         FACILITY:  [facility]   PHYSICIAN:  Madlyn Frankel. Charlann Boxer, M.D.  DATE OF BIRTH:  09/21/1940   DATE OF PROCEDURE:  12/18/2007  DATE OF DISCHARGE:                               OPERATIVE REPORT   PREOPERATIVE DIAGNOSIS:  Right hip osteoarthritis, history of left total  hip replacement.   POSTOPERATIVE DIAGNOSIS:  Right hip osteoarthritis, history of left  total hip replacement.   PROCEDURE:  Right total hip replacement.   COMPONENTS USED:  DePuy 52 Pinnacle cup, a 3 standard Tri-Lock stem, a  36 +4 AltrX liner, a 36 +4 and 36 +1.5 Delta ceramic ball.   SURGEON:  Madlyn Frankel. Charlann Boxer, M.D.   ASSISTANT:  Yetta Glassman. Marcellus Scott.   ANESTHESIA:  Spinal.   DRAINS:  One medium Hemovac.   BLOOD LOSS:  Approximately 350 mL.   COMPLICATIONS:  None.   SPECIMEN:  None.   INDICATIONS FOR PROCEDURE:  [name] is a 70 year old female known to  me with a history of total hip replacement on the left.  She has right  hip osteoarthritis, and at this point, she wishes to proceed with hip  replacement surgery.  We reviewed the risks and benefits of the  procedure including infection, dislocation, DVT, component failure, need  for revision, as well as bearing surfaces and her allergies.  I  discussed the hospital stay expectations.  Consent was obtained.   PROCEDURE IN DETAIL:  The patient was brought to the operative theater.  Once adequate anesthesia and preoperative antibiotics, Ancef,  administered, the patient was positioned in the left lateral decubitus  position with the right side up.  Once positioned with a peg-border  position, the right hip was pre-scrubbed and prepped and draped in  sterile fashion.  Lateral incision was made to the iliotibial band and  gluteal fascia.  These were incised posteriorly.  The short external  rotators  were identified and taken down to the posterior capsule.  An L  capsulotomy was made preserving the posterior capsule for later anatomic  repair as well as protect the sciatic nerve.  Prior to dislocation, we  identified the extremity lengths, trying to match them up from the  contralateral hip.   The hip was dislocated, and using her anatomic landmarks, neck osteotomy  was made based off the trial neck and head.   Attention was first directed to the femur.  I used a box osteotome to  debride lateral neck, then a starting drill, and hand reamed once.  I  then irrigated the canal to prevent fat emboli, began broaching, setting  anteversion native to her own neck which was at 20-25 degrees.   At this point, I broached up to a size 3 with good canal fit, matching  the contralateral hip.   The broach was removed and sponge placed back off the femur.  Attention  was now directed to the acetabulum.  Acetabular retractor were placed,  the labrum debrided.  I began reaming  with 44 reamer, reamed up to  46/47/49 and then 50 and 51 with good bony bed preparation.  Based on  this, we matched the contralateral hip, impacted a 52 Pinnacle cup,  setting it at 35-40 degrees of abduction, and flexed within the  acetabulum at 15-20 degrees.   I placed two cancellous screws to support this initial fixation.   A trial 36+ liner was placed and a trial reduction carried out.  The 3  standard neck was placed with a 36/1.5 ball.  Hip stability felt very  good with regards to the shuck with a millimeter of shuck in extension.  Stability was very good, tolerated internal rotation to 80 degrees at  neutral abduction and internal rotation.  No evidence impingement  otherwise.   At this point, all trial components were removed.  The hole eliminator  was placed in the acetabular shell.  A 36+ AltrX liner was impacted.   The final 3 Tri-Lock stem was then impacted to the level of the neck  cut, and after  retrial and to confirm the ball size, the 36/1.5 Delta  ceramic ball was impacted into position.   The hip was reduced, irrigated as it had been done throughout the case.  I reapproximated the posterior capsule with superior leaflet using #1  Vicryl.  A medium Hemovac drain was placed deep.  The iliotibial band  was reapproximated with #1 Vicryl.  The gluteal fascia was run using #1  Vicryl.  The remaining wound was closed with 2-0 Vicryl and running 4-0  Monocryl.  The the hip was cleaned, dried and dressed sterilely with a  Mepilex dressing.  She was brought to the recovery room in stable  condition, tolerating the procedure well.      Madlyn Frankel Charlann Boxer, M.D.  Electronically Signed     MDO/MEDQ  D:  12/18/2007  T:  12/18/2007  Job:  [id number]

## 2010-06-29 NOTE — Op Note (Signed)
[name], [name]             ACCOUNT NO.:  [id number]   MEDICAL RECORD NO.:  [id number]          PATIENT TYPE:  INP   LOCATION:  NA                           FACILITY:  [facility]   PHYSICIAN:  Madlyn Frankel. Charlann Boxer, M.D.  DATE OF BIRTH:  [birth year]   DATE OF PROCEDURE:  08/06/2007  DATE OF DISCHARGE:                               OPERATIVE REPORT   PREOPERATIVE DIAGNOSIS:  Left hip osteoarthritis.   POSTOPERATIVE DIAGNOSIS:  Left hip osteoarthritis.   PROCEDURE:  Left total hip replacement utilizing DePuy Pinnacle cup size  52, 36 +4 neutral Altrex liner with a 3 standard trial lock stem with a  36, 1.5 ceramic ball.   SURGEON:  Madlyn Frankel. Charlann Boxer, M.D.   ASSISTANT:  Dwyane Luo, PA-C   ANESTHESIA:  General.   BLOOD LOSS:  600 mL.   DRAINS:  x1.   COMPLICATIONS:  None.   INDICATIONS FOR PROCEDURE:  Ms. [name] is a 70 year old female who  presented to office for evaluation of hip pain.  Radiographs revealed  end-stage bilateral hip osteoarthritis with a dysplastic and mild Crowe  type 1 dysplastic appearance versus and severe arthritis.  Risks and  benefits were discussed, preop nonoperative versus operative  intervention.  She at this point wished to proceed with hip replacement.  We discussed risks of infection, DVT, component failure, dislocation,  consent was obtained.   PROCEDURE IN DETAIL:  The patient was brought to operative theater.  Once adequate anesthesia, preoperative antibiotics and Ancef  administered, the patient was positioned in the right lateral decubitus  position with the left side up.  The left lower extremity pre scrubbed  and prepped and draped in sterile fashion.  The lateral based incision  was made for posterior approach to the hip.  Sharp dissection was  carried down to the iliotibial band, gluteus fascia was incised  posteriorly.  The short external rotators were identified and taken down  separate from the posterior capsule.  An L capsulotomy was  made  preserving the posterior leaflet for an anatomic repair postoperatively  as well as protection of the sciatic nerve from retractors.  Hip was  dislocated and neck osteotomy made based off anatomic landmarks into the  trochanteric fossa.   Attention was first directed to the femur.  Femoral canal was opened and  hand reamed and then irrigated to prevent fat emboli.  I then began  broaching with size 1 broach and carried up to a size 3 broach.  The  size 3 broach was impacted.  A calcar planer was then used to mill down  the proximal bone.  Once I had the trial in position, I packed off the  femur and worked on the acetabulum.  Acetabular exposure was obtained  and I removed the labrum, began reaming with a 44 reamer and reamed up  to a 51 reamer with good bony bed preparation.  The final 52 cup was  impacted.  The acetabulum was noted to be a little bit shallow and I did  ream down to the medial wall and there was a bit of exposed Pinnacle  shell posterior laterally as expected.  I placed trial liner and went  through trial reduction.  This 3 stem was placed back on the standard  neck, 36 +4 liner.  Initially when I trialed it, the hip was very stable  in flexion and rotational but I was concerned about some impingement  posteriorly with extension and external rotation.  Trials were removed  and the acetabular shell was repositioned.  Once the shell was in the  new position, I checked with the hip guide and felt that there was about  35 to 40 degrees of abduction and probably about 15 degrees forward  flexion.  A single cancellous screw was placed.  Trial liner was again  placed.  Trial reduction was then carried out and the hip remained very  stable without evidence of impingement externally with external rotation  and extension.  At this point the final components were opened.  The  hole eliminator placed in the acetabular shell and the cup irrigated.  The final 36 + 4 Altrex  tracts liner was then placed.  The final 3  standard stem was impacted at the level where the neck cut had been  prepared and thus was placed on trials.  A 36, 1.5 ceramic ball was  impacted onto clean and dry trunnion.  Hip was reduced and irrigated  throughout the case.  I reapproximated posterior capsule with superior  leaflet using #1 Ethibond.  The medium Hemovac drain was placed deep  using #2 Quill suture to reapproximate the gluteal fascia and the  iliotibial band, then used 0 Quill in the subcu fat layer.  A couple of  2-0 Vicryls were placed to reapproximate some of the incision markers  and 4-0 Monocryl was then used in the subcu layer.  The hip was cleaned,  dried, and dressed sterilely with Steri-Strips and sterile Mepilex  dressing.  The patient was then brought to recovery room in stable  condition tolerating the procedure well.      Madlyn Frankel Charlann Boxer, M.D.  Electronically Signed     MDO/MEDQ  D:  08/06/2007  T:  08/06/2007  Job:  [id number]

## 2010-07-02 NOTE — Discharge Summary (Signed)
[name], [name]             ACCOUNT NO.:  [id number]   MEDICAL RECORD NO.:  [id number]          PATIENT TYPE:  INP   LOCATION:                               [facility]ACILITY:  [facility]   PHYSICIAN:  Madlyn Frankel. Charlann Boxer, M.D.  DATE OF BIRTH:  October 19, 1940   DATE OF ADMISSION:  12/18/2007  DATE OF DISCHARGE:  12/21/2007                               DISCHARGE SUMMARY   ADMITTING DIAGNOSES:  1. Osteoarthritis.  2. Fibromyalgia.  3. Depression.  4. Migraines.  5. Hypertension.  6. Reflux disease.  7. Dyslipidemia.   DISCHARGE DIAGNOSES:  1. Osteoarthritis.  2. Fibromyalgia.  3. Depression.  4. Migraines.  5. Hypertension.  6. Reflux disease.  7. Dyslipidemia.  8. Acute blood loss anemia.   HISTORY OF PRESENT ILLNESS:  A 70 year old female with a history of  right hip pain secondary to osteoarthritis, refractory to all  conservative treatment.   CONSULTATION:  None.   PROCEDURE:  Was right total hip replacement.   LABORATORY DATA:  CBC final reading hemoglobin 0.3, hematocrit 33.2,  platelets 121,000.  White cell differential within normal limits.  Her  coags were normal.  Metabolic panel final reading sodium 140, potassium  3.6, glucose 107, creatinine 0.82.  Calcium 8.7.  UA negative for  nitrites.   RADIOLOGY:  Portable pelvis showed right total hip placement with no  complicating features.  No chest x-ray found.   HOSPITAL COURSE:  The patient underwent a right total hip replacement  and tolerated procedure well, admitted to orthopedic floor.  She did  have acute blood loss anemia was transfused 2 units and this resolved  before discharge.  She had a little bit of headache from the spinal  anesthesia and otherwise did well with physical therapy.  Her dressing  was changed.  No significant drainage from wound.  She was weightbearing  as tolerated.  Was given some cortisone due to some  eczema breakout.  Otherwise she did well during the course of stay.  By  day three she was  ready to go, afebrile, dressing was changed.  Wound  looked great, neurovascular intact in stable condition.   DISCHARGE DISPOSITION:  Discharged home with home health care PT stable  improved condition.   DISCHARGE DIET:  Regular.   DISCHARGE WOUND CARE:  Keep dry.   DISCHARGE PHYSICAL THERAPY:  Weightbearing as tolerated with use rolling  walker.   DISCHARGE MEDICATIONS:  1. Lovenox 40 mg subcu q.24 times 11 days.  2. Robaxin 500 mg p.o. q.6.  3. Iron 325 mg p.o. t.i.d.  4. Aspirin 325 mg p.o. daily after Lovenox.  5. Colace mg p.o. b.i.d.  6. MiraLax 17 grams p.o. daily.  7. Vicodin 7.5/325 one to two p.o. q.4 to 6.  8. Clonazepam 1 mg p.o. nightly.  9. Fentanyl patch 12 mcg q.72 hours.  10.Lyrica 75 mg t.i.d.  11.Protonix 40 mg daily.  12.Nexium 0.60 mg b.i.d.  13.Celebrex daily.  14.Fosamax 70 mg q. weekly on Sunday.  15.Enalapril 10 mg daily.  16.Imitrex 50 mg p.r.n.  17.Vitamin D 3 daily.  18.Calcium b.i.d.   Discharge followup  with Dr. Charlann Boxer. At phone number [phone] for wound  check.     ______________________________  Yetta Glassman. [address], [place]      Madlyn Frankel. Charlann Boxer, M.D.  Electronically Signed    BLM/MEDQ  D:  02/05/2008  T:  02/05/2008  Job:  [id number]   cc:   Georgann Housekeeper, MD  Fax: [phone]

## 2010-07-02 NOTE — H&P (Signed)
NAMESKYELAR, HALLIDAY             ACCOUNT NO.:  [id number]   MEDICAL RECORD NO.:  [id number]          PATIENT TYPE:  OUT   LOCATION:                               FACILITY:  [facility]   PHYSICIAN:  Madlyn Frankel. Charlann Boxer, M.D.  DATE OF BIRTH:  [birth year]   DATE OF ADMISSION:  11/05/2007  DATE OF DISCHARGE:                              HISTORY & PHYSICAL   PROCEDURE:  Right total hip replacement.   CHIEF COMPLAINT:  Right hip pain.   HISTORY OF PRESENT ILLNESS:  A 70 year old female with a history of  right hip pain secondary to osteoarthritis.  She also has a recent  history of a left total hip replacement.  She is doing very well with  this.  Her right hip has been refractory to all conservative treatment.  She has previously been pre surgically assessed by Dr. Georgann Housekeeper and  Dr. Artist Pais.   PAST MEDICAL HISTORY:  Significant for:  1. Osteoarthritis.  2. Fibromyalgia.  3. Anxiety/depression.  4. Migraine headaches.  5. Hypertension.  6. Reflux disease.  7. Dyslipidemia.   PAST SURGICAL HISTORY:  Appendectomy, tonsillectomy, hysterectomy, and  left total hip replacement in June of 2009.   FAMILY HISTORY:  Cancer, heart disease, hypertension and arthritis.   DRUG ALLERGIES:  SULFA and NICKEL.   FOOD ALLERGIES:  SHELLFISH.   CURRENT MEDICATIONS:  1. Enalapril 10 mg p.o. daily.  2. Cymbalta 120 mg daily.  3. Lyrica 75 mg t.i.d.  4. Celebrex 200 mg p.o. daily.  5. Pantoprazole 40 mg p.o. daily.  6. Hydrocodone 7.5/750 one p.o. q.a.m. and one p.o. q.p.m.  7. Nu-Iron 150 mg 2 times a day.  8. Milk of Magnesia 2 tbs nightly.   REVIEW OF SYSTEMS:  None other than the HPI.   PHYSICAL EXAMINATION:  VITAL SIGNS:  Pulse 72, respirations 16, blood  pressure 128/84.  GENERAL:  Awake, alert, and oriented.  Well developed, well nourished.  NECK:  Supple, no carotid bruits.  CHEST:  Lungs are clear to auscultation bilaterally.  BREASTS:  Deferred.  HEART:  Regular rate and  rhythm, S1, S2 distinct.  ABDOMEN:  Soft, nontender, nondistended, bowel sounds present.  GENITOURINARY:  Deferred.  EXTREMITIES:  Right hip has limited range of motion with increased pain.  Left hip has normal range of motion with no groin pain.  SKIN:  No cellulitis.  Left hip wound well healed.  NEUROLOGIC:  Intact distal sensibilities.   LABORATORY DATA:  Labs are pending.   EKG and chest x-ray previously been done before other total hip  replacement.   IMPRESSION:  Right hip osteoarthritis.   PLAN OF ACTION:  Right total hip arthroplasty at [facility] by  surgeon Dr. Durene Romans on November 05, 2007.  Risks and complications  were discussed as well as rehab process.   Postoperative medications including Lovenox, Robaxin, iron, aspirin,  Colace, MiraLax provided at the time of the history and physical.  Pain  medicine to be provided at the time of surgery.     ______________________________  Yetta Glassman [address], [place]  Madlyn Frankel Charlann Boxer, M.D.  Electronically Signed    BLM/MEDQ  D:  10/31/2007  T:  11/01/2007  Job:  [id number]   cc:   Georgann Housekeeper, MD  Fax: [phone]   Artist Pais, M.D.  Fax: [phone]

## 2010-07-02 NOTE — Discharge Summary (Signed)
[name], [name]             ACCOUNT NO.:  [id number]   MEDICAL RECORD NO.:  [id number]          PATIENT TYPE:  INP   LOCATION:  1608                         FACILITY:  [facility]   PHYSICIAN:  Madlyn Frankel. Charlann Boxer, M.D.  DATE OF BIRTH:  [birth year]   DATE OF ADMISSION:  08/06/2007  DATE OF DISCHARGE:  08/09/2007                               DISCHARGE SUMMARY   ADMITTING DIAGNOSES:  1. Osteoarthritis.  2. Fibromyalgia.  3. Anxiety, depression.  4. Migraines.  5. Hypertension.  6. Reflux.  7. Dyslipidemia.   DISCHARGE DIAGNOSIS:  1. Osteoarthritis.  2. Fibromyalgia.  3. Anxiety, depression.  4. Migraines.  5. Hypertension.  6. Reflux disease.  7. Dyslipidemia.   1. Acute blood loss anemia.   HISTORY OF PRESENT ILLNESS:  A 70 year old female with a history of left  hip pain secondary to osteoarthritis that was refractory to all  conservative treatment.   PROCEDURE:  Left total hip arthroplasty by surgeon Dr. Durene Romans.  Assistant, Dwyane Luo, PA-C.   CONSULTATION:  None.   LABS:  CBC upon admission hemoglobin 0.9, hematocrit 33.9, platelets  195,000.  At time of discharge after replenishment, hemoglobin 9.2,  hematocrit 26.5 and platelets 109,000.  She was transfused 2 units of  packed red blood cells.  White cell differential within normal limits.  Coagulation, her PTT was 39.  The rest were all within normal limits.  Routine chemistry on admission sodium 141, potassium 4.1, glucose 71,  creatinine 0.92.  At time of discharge sodium 140, potassium 4, glucose  116, creatinine 0.93.  Routine chemistry and kidney function showed a  GFR to be greater than 60, calcium 8.9.  UA was negative.   Chest CT with contrast showed no acute process in the chest, no lung  nodule.  Plain film abnormality was likely artificial due to summation  shadow.  She had numerous right shoulder lucent bodies.   Chest two-view showed right suprahilar nodular density which was why the  CT of the  chest was performed.  There were no other acute  cardiopulmonary abnormalities.  She did have some calcific densities  within the biceps tendon sheaths.   CARDIOLOGY:  EKG normal sinus rhythm.   HOSPITAL COURSE:  The patient admitted to the hospital and underwent  left total hip replacement.  She did have acute blood loss anemia and  was transfused 2 units on her first day.  Dressing was dry.  Hemovac was  DC'd.  She was neurovascularly intact to the left lower extremity and  remained so throughout the course of her stay.  She was weightbearing as  tolerated with use of rolling walker.  She did very well with her  physical therapy and was able to ambulate over 20 feet prior to her  discharge.  On day 2, CT was normal.  No further changes.  She made  excellent progress when seen on the third day.  She was afebrile.  All  lab values within normal limits.  Her dressing was changed, wound looked  great.  Neurovascularly intact to the left lower extremity,  weightbearing as tolerated, and was  ready to go home with home care PT.   DISPOSITION:  Discharged home in stable and improved condition with home  health care PT.   DISCHARGE DIET:  Regular.   DISCHARGE WOUND CARE:  Keep dry.   DISCHARGE PHYSICAL THERAPY:  Weightbearing as tolerated.  She will use  rolling walker.   DISCHARGE MEDICATIONS:  1. Lovenox 40 mg subcu 24 times 11 days.  2. Robaxin 5 mg p.o. q.6.  3. Iron 325 mg t.i.d. x2 weeks.  4. Aspirin 325 mg p.o. daily x4 weeks after Lovenox completed.  5. Colace 100 mg p.o. b.i.d.  6. MiraLax 17 grams p.o. daily.  7. Vicodin 5/325 one to two p.o. q. 4 to 6 p.r.n. pain.  8. Tizanidine 4 mg p.o. nightly.  9. Fentanyl patch 12 mcg q.3 days.  10.Lyrica 75 mg t.i.d.  11.Lidoderm 5% patch bedtime daily.  12.Pantoprazole 40 mg p.o. q. a.m.  13.Cymbalta 60 mg p.o. b.i.d.  14.Celebrex p.o. daily.  15.Alendronate 70 mg p.o. q. Sunday.  16.Enalapril 10 mg p.o. q. a.m.  17.Ativan 1  mg p.o. nightly.  18.Imitrex 50 mg p.o. p.r.n.  19.Lunesta 2 mg p.o. nightly.   DISCHARGE FOLLOWUP:  Follow with Dr. Charlann Boxer at phone number [phone] in 2  weeks for wound check.     ______________________________  Yetta Glassman. [address], [place]      Madlyn Frankel. Charlann Boxer, M.D.  Electronically Signed    BLM/MEDQ  D:  09/03/2007  T:  09/03/2007  Job:  [id number]   cc:   Georgann Housekeeper, MD  Fax: [phone]

## 2010-07-13 ENCOUNTER — Other Ambulatory Visit: Payer: Self-pay | Admitting: Gastroenterology

## 2010-07-19 ENCOUNTER — Ambulatory Visit
Admission: RE | Admit: 2010-07-19 | Discharge: 2010-07-19 | Disposition: A | Discharge status: Home / Self Care | Payer: Medicare Other | Source: Ambulatory Visit | Attending: Gastroenterology | Admitting: Gastroenterology

## 2010-11-11 LAB — URINALYSIS, ROUTINE W REFLEX MICROSCOPIC
Bilirubin Urine: NEGATIVE
Glucose, UA: NEGATIVE
Hgb urine dipstick: NEGATIVE
Ketones, ur: NEGATIVE
Nitrite: NEGATIVE
Protein, ur: NEGATIVE
Specific Gravity, Urine: 1.019
Urobilinogen, UA: 0.2
pH: 5.5

## 2010-11-11 LAB — PROTIME-INR
INR: 1
Prothrombin Time: 13.1

## 2010-11-11 LAB — CBC
HCT: 20.3 — ABNORMAL LOW
HCT: 26.5 — ABNORMAL LOW
HCT: 33.9 — ABNORMAL LOW
Hemoglobin: 11.9 — ABNORMAL LOW
Hemoglobin: 7.1 — CL
Hemoglobin: 9.2 — ABNORMAL LOW
MCHC: 34.7
MCHC: 34.8
MCHC: 35.1
MCV: 90.3
MCV: 92.4
MCV: 94.3
Platelets: 109 — ABNORMAL LOW
Platelets: 134 — ABNORMAL LOW
Platelets: 195
RBC: 2.16 — ABNORMAL LOW
RBC: 2.93 — ABNORMAL LOW
RBC: 3.67 — ABNORMAL LOW
RDW: 12.3
RDW: 12.6
RDW: 14.2
WBC: 5.2
WBC: 6.4
WBC: 6.8

## 2010-11-11 LAB — DIFFERENTIAL
Basophils Absolute: 0
Basophils Relative: 1
Eosinophils Absolute: 0.2
Eosinophils Relative: 4
Lymphocytes Relative: 17
Lymphs Abs: 0.9
Monocytes Absolute: 0.3
Monocytes Relative: 5
Neutro Abs: 3.8
Neutrophils Relative %: 73

## 2010-11-11 LAB — BASIC METABOLIC PANEL
BUN: 12
BUN: 15
BUN: 18
CO2: 27
CO2: 28
CO2: 28
Calcium: 8.7
Calcium: 8.9
Calcium: 9.3
Chloride: 106
Chloride: 108
Chloride: 110
Creatinine, Ser: 0.79
Creatinine, Ser: 0.92
Creatinine, Ser: 0.93
GFR calc Af Amer: >60
GFR calc Af Amer: >60
GFR calc Af Amer: >60
GFR calc non Af Amer: >60
GFR calc non Af Amer: >60
GFR calc non Af Amer: >60
Glucose, Bld: 116 — ABNORMAL HIGH
Glucose, Bld: 133 — ABNORMAL HIGH
Glucose, Bld: 71
Potassium: 4
Potassium: 4.1
Potassium: 4.4
Sodium: 140
Sodium: 141
Sodium: 141

## 2010-11-11 LAB — TYPE AND SCREEN
ABO/RH(D): A POS
Antibody Screen: NEGATIVE

## 2010-11-11 LAB — APTT: aPTT: 39 — ABNORMAL HIGH

## 2010-11-11 LAB — ABO/RH: ABO/RH(D): A POS

## 2010-11-15 LAB — DIFFERENTIAL
Basophils Absolute: 0
Basophils Absolute: 0
Basophils Relative: 1
Basophils Relative: 1
Eosinophils Absolute: 0.2
Eosinophils Absolute: 0.2
Eosinophils Relative: 4
Eosinophils Relative: 4
Lymphocytes Relative: 23
Lymphocytes Relative: 18
Lymphs Abs: 1
Lymphs Abs: 0.8
Monocytes Absolute: 0.3
Monocytes Absolute: 0.3
Monocytes Relative: 6
Monocytes Relative: 7
Neutro Abs: 2.9
Neutro Abs: 3.2
Neutrophils Relative %: 66
Neutrophils Relative %: 71

## 2010-11-15 LAB — CBC
HCT: 37
HCT: 37.4
Hemoglobin: 12.6
Hemoglobin: 12.8
MCHC: 34.1
MCHC: 34.3
MCV: 93.7
MCV: 94
Platelets: 178
Platelets: 195
RBC: 3.95
RBC: 3.98
RDW: 13.4
RDW: 13.3
WBC: 4.5
WBC: 4.5

## 2010-11-15 LAB — BASIC METABOLIC PANEL
BUN: 18
BUN: 21
CO2: 29
CO2: 29
Calcium: 9.4
Calcium: 9.4
Chloride: 107
Chloride: 105
Creatinine, Ser: 0.94
Creatinine, Ser: 0.88
GFR calc Af Amer: >60
GFR calc Af Amer: >60
GFR calc non Af Amer: 60 — ABNORMAL LOW
GFR calc non Af Amer: >60
Glucose, Bld: 91
Glucose, Bld: 83
Potassium: 4.1
Potassium: 5.1
Sodium: 141
Sodium: 139

## 2010-11-15 LAB — URINALYSIS, ROUTINE W REFLEX MICROSCOPIC
Bilirubin Urine: NEGATIVE
Bilirubin Urine: NEGATIVE
Glucose, UA: NEGATIVE
Glucose, UA: NEGATIVE
Hgb urine dipstick: NEGATIVE
Hgb urine dipstick: NEGATIVE
Ketones, ur: NEGATIVE
Ketones, ur: NEGATIVE
Nitrite: NEGATIVE
Nitrite: NEGATIVE
Protein, ur: NEGATIVE
Protein, ur: NEGATIVE
Specific Gravity, Urine: 1.017
Specific Gravity, Urine: 1.019
Urobilinogen, UA: 0.2
Urobilinogen, UA: 0.2
pH: 7
pH: 6.5

## 2010-11-15 LAB — APTT
aPTT: 39 — ABNORMAL HIGH
aPTT: 36

## 2010-11-15 LAB — URINE MICROSCOPIC-ADD ON

## 2010-11-15 LAB — TYPE AND SCREEN
ABO/RH(D): A POS
Antibody Screen: NEGATIVE

## 2010-11-15 LAB — PROTIME-INR
INR: 0.9
INR: 1
Prothrombin Time: 12.7
Prothrombin Time: 12.8

## 2010-11-16 LAB — BASIC METABOLIC PANEL
BUN: 12
BUN: 9
CO2: 28
CO2: 32
Calcium: 8.3 — ABNORMAL LOW
Calcium: 8.7
Chloride: 105
Chloride: 106
Creatinine, Ser: 0.74
Creatinine, Ser: 0.82
GFR calc Af Amer: >60
GFR calc Af Amer: >60
GFR calc non Af Amer: >60
GFR calc non Af Amer: >60
Glucose, Bld: 107 — ABNORMAL HIGH
Glucose, Bld: 132 — ABNORMAL HIGH
Potassium: 3.6
Potassium: 4.1
Sodium: 137
Sodium: 140

## 2010-11-16 LAB — CBC
HCT: 25.7 — ABNORMAL LOW
HCT: 33.2 — ABNORMAL LOW
Hemoglobin: 11.3 — ABNORMAL LOW
Hemoglobin: 9.1 — ABNORMAL LOW
MCHC: 34.1
MCHC: 35.3
MCV: 93.2
MCV: 94
Platelets: 121 — ABNORMAL LOW
Platelets: 125 — ABNORMAL LOW
RBC: 2.74 — ABNORMAL LOW
RBC: 3.57 — ABNORMAL LOW
RDW: 12.9
RDW: 14.3
WBC: 5.6
WBC: 6.2

## 2010-11-16 LAB — PREPARE RBC (CROSSMATCH)

## 2010-12-28 ENCOUNTER — Other Ambulatory Visit (HOSPITAL_COMMUNITY): Payer: Self-pay | Admitting: Obstetrics and Gynecology

## 2010-12-28 DIAGNOSIS — Z1231 Encounter for screening mammogram for malignant neoplasm of breast: Secondary | ICD-10-CM

## 2011-01-28 ENCOUNTER — Other Ambulatory Visit: Payer: Self-pay | Admitting: Internal Medicine

## 2011-01-28 ENCOUNTER — Ambulatory Visit
Admission: RE | Admit: 2011-01-28 | Discharge: 2011-01-28 | Disposition: A | Discharge status: Home / Self Care | Payer: Medicare Other | Source: Ambulatory Visit | Attending: Internal Medicine | Admitting: Internal Medicine

## 2011-01-28 DIAGNOSIS — S0990XA Unspecified injury of head, initial encounter: Secondary | ICD-10-CM

## 2011-01-31 ENCOUNTER — Ambulatory Visit (HOSPITAL_COMMUNITY): Payer: Medicare Other

## 2011-02-16 DIAGNOSIS — R413 Other amnesia: Secondary | ICD-10-CM | POA: Diagnosis not present

## 2011-02-24 DIAGNOSIS — R259 Unspecified abnormal involuntary movements: Secondary | ICD-10-CM | POA: Diagnosis not present

## 2011-02-24 DIAGNOSIS — F329 Major depressive disorder, single episode, unspecified: Secondary | ICD-10-CM | POA: Diagnosis not present

## 2011-02-25 DIAGNOSIS — S61209A Unspecified open wound of unspecified finger without damage to nail, initial encounter: Secondary | ICD-10-CM | POA: Diagnosis not present

## 2011-03-04 ENCOUNTER — Ambulatory Visit (HOSPITAL_COMMUNITY): Payer: Medicare Other

## 2011-03-14 DIAGNOSIS — S61209A Unspecified open wound of unspecified finger without damage to nail, initial encounter: Secondary | ICD-10-CM | POA: Diagnosis not present

## 2011-03-15 DIAGNOSIS — M899 Disorder of bone, unspecified: Secondary | ICD-10-CM | POA: Diagnosis not present

## 2011-03-15 DIAGNOSIS — M949 Disorder of cartilage, unspecified: Secondary | ICD-10-CM | POA: Diagnosis not present

## 2011-03-15 DIAGNOSIS — Z01419 Encounter for gynecological examination (general) (routine) without abnormal findings: Secondary | ICD-10-CM | POA: Diagnosis not present

## 2011-03-29 DIAGNOSIS — H43399 Other vitreous opacities, unspecified eye: Secondary | ICD-10-CM | POA: Diagnosis not present

## 2011-04-01 ENCOUNTER — Ambulatory Visit (HOSPITAL_COMMUNITY)
Admission: RE | Admit: 2011-04-01 | Discharge: 2011-04-01 | Disposition: A | Discharge status: Home / Self Care | Payer: Medicare Other | Source: Ambulatory Visit | Attending: Obstetrics and Gynecology | Admitting: Obstetrics and Gynecology

## 2011-04-01 DIAGNOSIS — Z1231 Encounter for screening mammogram for malignant neoplasm of breast: Secondary | ICD-10-CM | POA: Diagnosis not present

## 2011-04-13 DIAGNOSIS — Z79899 Other long term (current) drug therapy: Secondary | ICD-10-CM | POA: Diagnosis not present

## 2011-04-13 DIAGNOSIS — K5901 Slow transit constipation: Secondary | ICD-10-CM | POA: Diagnosis not present

## 2011-04-13 DIAGNOSIS — R82998 Other abnormal findings in urine: Secondary | ICD-10-CM | POA: Diagnosis not present

## 2011-04-13 DIAGNOSIS — K219 Gastro-esophageal reflux disease without esophagitis: Secondary | ICD-10-CM | POA: Diagnosis not present

## 2011-05-02 DIAGNOSIS — M2420 Disorder of ligament, unspecified site: Secondary | ICD-10-CM | POA: Diagnosis not present

## 2011-05-02 DIAGNOSIS — H5340 Unspecified visual field defects: Secondary | ICD-10-CM | POA: Diagnosis not present

## 2011-05-02 DIAGNOSIS — L918 Other hypertrophic disorders of the skin: Secondary | ICD-10-CM | POA: Diagnosis not present

## 2011-05-02 DIAGNOSIS — H02839 Dermatochalasis of unspecified eye, unspecified eyelid: Secondary | ICD-10-CM | POA: Diagnosis not present

## 2011-05-02 DIAGNOSIS — H02429 Myogenic ptosis of unspecified eyelid: Secondary | ICD-10-CM | POA: Diagnosis not present

## 2011-05-02 DIAGNOSIS — H04129 Dry eye syndrome of unspecified lacrimal gland: Secondary | ICD-10-CM | POA: Diagnosis not present

## 2011-05-02 DIAGNOSIS — L908 Other atrophic disorders of skin: Secondary | ICD-10-CM | POA: Diagnosis not present

## 2011-05-10 DIAGNOSIS — I10 Essential (primary) hypertension: Secondary | ICD-10-CM | POA: Diagnosis not present

## 2011-05-10 DIAGNOSIS — F329 Major depressive disorder, single episode, unspecified: Secondary | ICD-10-CM | POA: Diagnosis not present

## 2011-05-10 DIAGNOSIS — K219 Gastro-esophageal reflux disease without esophagitis: Secondary | ICD-10-CM | POA: Diagnosis not present

## 2011-05-10 DIAGNOSIS — Z1331 Encounter for screening for depression: Secondary | ICD-10-CM | POA: Diagnosis not present

## 2011-05-10 DIAGNOSIS — IMO0001 Reserved for inherently not codable concepts without codable children: Secondary | ICD-10-CM | POA: Diagnosis not present

## 2011-05-10 DIAGNOSIS — E782 Mixed hyperlipidemia: Secondary | ICD-10-CM | POA: Diagnosis not present

## 2011-05-23 DIAGNOSIS — Z961 Presence of intraocular lens: Secondary | ICD-10-CM | POA: Diagnosis not present

## 2011-06-27 DIAGNOSIS — E782 Mixed hyperlipidemia: Secondary | ICD-10-CM | POA: Diagnosis not present

## 2011-08-01 DIAGNOSIS — E782 Mixed hyperlipidemia: Secondary | ICD-10-CM | POA: Diagnosis not present

## 2011-09-05 DIAGNOSIS — E782 Mixed hyperlipidemia: Secondary | ICD-10-CM | POA: Diagnosis not present

## 2011-09-12 DIAGNOSIS — M899 Disorder of bone, unspecified: Secondary | ICD-10-CM | POA: Diagnosis not present

## 2011-09-12 DIAGNOSIS — E782 Mixed hyperlipidemia: Secondary | ICD-10-CM | POA: Diagnosis not present

## 2011-09-12 DIAGNOSIS — M949 Disorder of cartilage, unspecified: Secondary | ICD-10-CM | POA: Diagnosis not present

## 2011-09-12 DIAGNOSIS — N182 Chronic kidney disease, stage 2 (mild): Secondary | ICD-10-CM | POA: Diagnosis not present

## 2011-09-12 DIAGNOSIS — K219 Gastro-esophageal reflux disease without esophagitis: Secondary | ICD-10-CM | POA: Diagnosis not present

## 2011-09-12 DIAGNOSIS — Z1331 Encounter for screening for depression: Secondary | ICD-10-CM | POA: Diagnosis not present

## 2011-09-12 DIAGNOSIS — I10 Essential (primary) hypertension: Secondary | ICD-10-CM | POA: Diagnosis not present

## 2011-09-12 DIAGNOSIS — F329 Major depressive disorder, single episode, unspecified: Secondary | ICD-10-CM | POA: Diagnosis not present

## 2011-09-16 DIAGNOSIS — R609 Edema, unspecified: Secondary | ICD-10-CM | POA: Diagnosis not present

## 2011-09-16 DIAGNOSIS — R071 Chest pain on breathing: Secondary | ICD-10-CM | POA: Diagnosis not present

## 2011-09-16 DIAGNOSIS — IMO0001 Reserved for inherently not codable concepts without codable children: Secondary | ICD-10-CM | POA: Diagnosis not present

## 2011-09-16 DIAGNOSIS — T887XXA Unspecified adverse effect of drug or medicament, initial encounter: Secondary | ICD-10-CM | POA: Diagnosis not present

## 2011-09-22 DIAGNOSIS — I10 Essential (primary) hypertension: Secondary | ICD-10-CM | POA: Diagnosis not present

## 2011-09-22 DIAGNOSIS — N183 Chronic kidney disease, stage 3 unspecified: Secondary | ICD-10-CM | POA: Diagnosis not present

## 2011-09-22 DIAGNOSIS — E782 Mixed hyperlipidemia: Secondary | ICD-10-CM | POA: Diagnosis not present

## 2011-10-04 DIAGNOSIS — E782 Mixed hyperlipidemia: Secondary | ICD-10-CM | POA: Diagnosis not present

## 2011-10-28 DIAGNOSIS — N390 Urinary tract infection, site not specified: Secondary | ICD-10-CM | POA: Diagnosis not present

## 2011-10-31 DIAGNOSIS — M5430 Sciatica, unspecified side: Secondary | ICD-10-CM | POA: Diagnosis not present

## 2011-10-31 DIAGNOSIS — M169 Osteoarthritis of hip, unspecified: Secondary | ICD-10-CM | POA: Diagnosis not present

## 2011-10-31 DIAGNOSIS — M899 Disorder of bone, unspecified: Secondary | ICD-10-CM | POA: Diagnosis not present

## 2011-10-31 DIAGNOSIS — M949 Disorder of cartilage, unspecified: Secondary | ICD-10-CM | POA: Diagnosis not present

## 2011-12-12 ENCOUNTER — Encounter (HOSPITAL_COMMUNITY): Payer: Self-pay | Admitting: *Deleted

## 2011-12-12 ENCOUNTER — Emergency Department (HOSPITAL_COMMUNITY)
Admission: EM | Admit: 2011-12-12 | Discharge: 2011-12-12 | Disposition: A | Discharge status: Home / Self Care | Payer: Medicare Other | Attending: Emergency Medicine | Admitting: Emergency Medicine

## 2011-12-12 ENCOUNTER — Emergency Department (HOSPITAL_COMMUNITY): Payer: Medicare Other

## 2011-12-12 DIAGNOSIS — Z043 Encounter for examination and observation following other accident: Secondary | ICD-10-CM | POA: Diagnosis not present

## 2011-12-12 DIAGNOSIS — S0990XA Unspecified injury of head, initial encounter: Secondary | ICD-10-CM | POA: Diagnosis not present

## 2011-12-12 DIAGNOSIS — I10 Essential (primary) hypertension: Secondary | ICD-10-CM | POA: Insufficient documentation

## 2011-12-12 DIAGNOSIS — S060X9A Concussion with loss of consciousness of unspecified duration, initial encounter: Secondary | ICD-10-CM | POA: Insufficient documentation

## 2011-12-12 DIAGNOSIS — R51 Headache: Secondary | ICD-10-CM | POA: Insufficient documentation

## 2011-12-12 DIAGNOSIS — W010XXA Fall on same level from slipping, tripping and stumbling without subsequent striking against object, initial encounter: Secondary | ICD-10-CM | POA: Insufficient documentation

## 2011-12-12 DIAGNOSIS — Y929 Unspecified place or not applicable: Secondary | ICD-10-CM | POA: Insufficient documentation

## 2011-12-12 DIAGNOSIS — Z79899 Other long term (current) drug therapy: Secondary | ICD-10-CM | POA: Diagnosis not present

## 2011-12-12 DIAGNOSIS — Z8669 Personal history of other diseases of the nervous system and sense organs: Secondary | ICD-10-CM | POA: Insufficient documentation

## 2011-12-12 DIAGNOSIS — IMO0001 Reserved for inherently not codable concepts without codable children: Secondary | ICD-10-CM | POA: Diagnosis not present

## 2011-12-12 DIAGNOSIS — Y939 Activity, unspecified: Secondary | ICD-10-CM | POA: Insufficient documentation

## 2011-12-12 HISTORY — DX: Fibromyalgia: M79.7

## 2011-12-12 HISTORY — DX: Migraine, unspecified, not intractable, without status migrainosus: G43.909

## 2011-12-12 HISTORY — DX: Essential (primary) hypertension: I10

## 2011-12-12 MED ORDER — HYDROCODONE-ACETAMINOPHEN 5-500 MG PO TABS: 1.0000 | ORAL_TABLET | Freq: Four times a day (QID) | ORAL | Status: DC | PRN

## 2011-12-12 MED ORDER — OXYCODONE-ACETAMINOPHEN 5-325 MG PO TABS
1.0000 | ORAL_TABLET | Freq: Once | ORAL | Status: AC
  Administered 2011-12-12: 1 via ORAL
  Filled 2011-12-12: qty 1

## 2011-12-12 NOTE — ED Provider Notes (Signed)
History     CSN: [id number]  Arrival date & time 12/12/11  0119   First MD Initiated Contact with Patient 12/12/11 [phone]      Chief Complaint  Patient presents with  . Fall  . Facial Pain    (Consider location/radiation/quality/duration/timing/severity/associated sxs/prior treatment) HPI HX per PT. Got up in the middle of the night and fell striking R temple on corner of the night stand. ? Brief LOC. No neck pain or other injury. No weakness or numbness. No bleeding, pain is sharp and not radiating. MOD in severity  Past Medical History  Diagnosis Date  . Fibromyalgia   . Migraines   . Hypertension     Past Surgical History  Procedure Date  . Total hip arthroplasty     bil  . Appendectomy   . Tonsillectomy   . Abdominal hysterectomy   . Adenoidectomy     No family history on file.  History  Substance Use Topics  . Smoking status: Never Smoker   . Smokeless tobacco: Not on file  . Alcohol Use: No    OB History    Grav Para Term Preterm Abortions TAB SAB Ect Mult Living                  Review of Systems  Constitutional: Negative for fever and chills.  HENT: Negative for neck pain and neck stiffness.   Eyes: Negative for pain.  Respiratory: Negative for shortness of breath.   Cardiovascular: Negative for chest pain.  Gastrointestinal: Negative for abdominal pain.  Genitourinary: Negative for dysuria.  Musculoskeletal: Negative for back pain.  Skin: Positive for wound. Negative for rash.  Neurological: Positive for headaches.  All other systems reviewed and are negative.    Allergies  Ciprofloxacin; Erythromycin; and Sulfa antibiotics  Home Medications   Current Outpatient Rx  Name Route Sig Dispense Refill  . CALTRATE 600+D PO Oral Take 1 tablet by mouth daily.    . COQ10 PO Oral Take 1 tablet by mouth daily.    . DULOXETINE HCL 60 MG PO CPEP Oral Take 60 mg by mouth daily.    . ENALAPRIL MALEATE 20 MG PO TABS Oral Take 20 mg by mouth daily.     Marland Kitchen EZETIMIBE 10 MG PO TABS Oral Take 10 mg by mouth daily.    . FENOFIBRATE 48 MG PO TABS Oral Take 48 mg by mouth daily.    Marland Kitchen FLUOXETINE HCL 40 MG PO CAPS Oral Take 40 mg by mouth daily.    Marland Kitchen HYDROCODONE-ACETAMINOPHEN 7.5-500 MG PO TABS Oral Take 1 tablet by mouth daily.    Marland Kitchen LORAZEPAM 0.5 MG PO TABS Oral Take 0.5 mg by mouth at bedtime.    . ADULT MULTIVITAMIN W/MINERALS CH Oral Take 1 tablet by mouth daily.    . ICAPS PO Oral Take 1 tablet by mouth daily.    Marland Kitchen OMEPRAZOLE 40 MG PO CPDR Oral Take 40 mg by mouth 2 (two) times daily.    Marland Kitchen PREGABALIN 75 MG PO CAPS Oral Take 75 mg by mouth daily.    Marland Kitchen RESVERATROL PO Oral Take 1 tablet by mouth daily.    . SUMATRIPTAN SUCCINATE 50 MG PO TABS Oral Take 50 mg by mouth every 2 (two) hours as needed. For migraine      BP 163/84  Pulse 77  Temp 97.6 F (36.4 C) (Oral)  Resp 18  SpO2 98%  Physical Exam  Constitutional: She is oriented to person, place, and time. She  appears well-developed and well-nourished.  HENT:  Head: Normocephalic.  Nose: Nose normal.       TTP over R temporal area with swelling, no lac or abrasion. No tenderness over TMJ, TMs clear. No mastoid tenderness, swelling or ecchymosis.   Eyes: Conjunctivae normal and EOM are normal. Pupils are equal, round, and reactive to light.  Neck: Full passive range of motion without pain. Neck supple.       No midline tenderness or defomrity  Cardiovascular: Normal rate, regular rhythm, S1 normal, S2 normal and intact distal pulses.   Pulmonary/Chest: Effort normal and breath sounds normal.  Abdominal: Soft. Bowel sounds are normal. There is no tenderness. There is no CVA tenderness.  Musculoskeletal: Normal range of motion.  Neurological: She is alert and oriented to person, place, and time. She has normal strength and normal reflexes. No cranial nerve deficit or sensory deficit. She displays a negative Romberg sign. GCS eye subscore is 4. GCS verbal subscore is 5. GCS motor subscore  is 6.       Normal Gait  Skin: Skin is warm and dry. No rash noted. No cyanosis. Nails show no clubbing.  Psychiatric: She has a normal mood and affect. Her speech is normal and behavior is normal.    ED Course  Procedures (including critical care time)  Labs Reviewed - No data to display Ct Head Wo Contrast  12/12/2011  *RADIOLOGY REPORT*  Clinical Data:  Status post fall with a blow to the head.  CT HEAD WITHOUT CONTRAST CT CERVICAL SPINE WITHOUT CONTRAST  Technique:  Multidetector CT imaging of the head and cervical spine was performed following the standard protocol without intravenous contrast.  Multiplanar CT image reconstructions of the cervical spine were also generated.  Comparison:  Head CT scan 01/28/2011.  CT HEAD  Findings: There is no evidence of acute intracranial abnormality including infarct, hemorrhage, mass lesion, mass effect, midline shift or abnormal extra-axial fluid collection.  No hydrocephalus or pneumocephalus.  The calvarium is intact.  IMPRESSION: Negative exam.  CT CERVICAL SPINE  Findings: Vertebral body height and alignment are unremarkable. There is loss of disc space height at C5-6.  Facet degenerative disease appears worst on the left at C4-5.  Lung apices demonstrate some scarring.  IMPRESSION: No acute finding.  Degenerative disease as described above.   Original Report Authenticated By: Bernadene Bell. Maricela Curet, M.D.    Ct Cervical Spine Wo Contrast  12/12/2011  *RADIOLOGY REPORT*  Clinical Data:  Status post fall with a blow to the head.  CT HEAD WITHOUT CONTRAST CT CERVICAL SPINE WITHOUT CONTRAST  Technique:  Multidetector CT imaging of the head and cervical spine was performed following the standard protocol without intravenous contrast.  Multiplanar CT image reconstructions of the cervical spine were also generated.  Comparison:  Head CT scan 01/28/2011.  CT HEAD  Findings: There is no evidence of acute intracranial abnormality including infarct, hemorrhage, mass  lesion, mass effect, midline shift or abnormal extra-axial fluid collection.  No hydrocephalus or pneumocephalus.  The calvarium is intact.  IMPRESSION: Negative exam.  CT CERVICAL SPINE  Findings: Vertebral body height and alignment are unremarkable. There is loss of disc space height at C5-6.  Facet degenerative disease appears worst on the left at C4-5.  Lung apices demonstrate some scarring.  IMPRESSION: No acute finding.  Degenerative disease as described above.   Original Report Authenticated By: Bernadene Bell. D'ALESSIO, M.D.     PO percocet. Imaging obtained/ reviewed as above  No change normal  neuro exam.   MDM    Head injury with brief LOC and CT scans as above - no acute intracranial trauma. Pain improved. VS and nursing notes reviewed. Plan d/c home pain medications and f/u PCP as needed. Head injury precautions verbalized as understood.         Sunnie Nielsen, MD 12/12/11 [phone]

## 2011-12-12 NOTE — ED Notes (Addendum)
PT to ED c/o falling after tripping over a pair of shoes, hitting her R temple on a rounded night-stand.  Pt feels things "became black temporarily", but she did not lose consciousness.  Presently, pt states pain 5/10 to R temple and increased pain when she opens her mouth and when she swallows.  Pt is NOT on any blood thinners.  AO x 4.

## 2011-12-12 NOTE — ED Notes (Signed)
Pt states understanding of discharge instructions 

## 2011-12-19 DIAGNOSIS — M545 Low back pain, unspecified: Secondary | ICD-10-CM | POA: Diagnosis not present

## 2011-12-19 DIAGNOSIS — N390 Urinary tract infection, site not specified: Secondary | ICD-10-CM | POA: Diagnosis not present

## 2011-12-19 DIAGNOSIS — IMO0001 Reserved for inherently not codable concepts without codable children: Secondary | ICD-10-CM | POA: Diagnosis not present

## 2012-01-12 DIAGNOSIS — Z23 Encounter for immunization: Secondary | ICD-10-CM | POA: Diagnosis not present

## 2012-01-26 DIAGNOSIS — N390 Urinary tract infection, site not specified: Secondary | ICD-10-CM | POA: Diagnosis not present

## 2012-01-26 DIAGNOSIS — I10 Essential (primary) hypertension: Secondary | ICD-10-CM | POA: Diagnosis not present

## 2012-01-26 DIAGNOSIS — N183 Chronic kidney disease, stage 3 unspecified: Secondary | ICD-10-CM | POA: Diagnosis not present

## 2012-01-26 DIAGNOSIS — E782 Mixed hyperlipidemia: Secondary | ICD-10-CM | POA: Diagnosis not present

## 2012-01-26 DIAGNOSIS — F329 Major depressive disorder, single episode, unspecified: Secondary | ICD-10-CM | POA: Diagnosis not present

## 2012-01-26 DIAGNOSIS — IMO0001 Reserved for inherently not codable concepts without codable children: Secondary | ICD-10-CM | POA: Diagnosis not present

## 2012-01-31 DIAGNOSIS — I10 Essential (primary) hypertension: Secondary | ICD-10-CM | POA: Diagnosis not present

## 2012-01-31 DIAGNOSIS — N390 Urinary tract infection, site not specified: Secondary | ICD-10-CM | POA: Diagnosis not present

## 2012-01-31 DIAGNOSIS — K5901 Slow transit constipation: Secondary | ICD-10-CM | POA: Diagnosis not present

## 2012-02-01 DIAGNOSIS — G250 Essential tremor: Secondary | ICD-10-CM | POA: Diagnosis not present

## 2012-02-24 ENCOUNTER — Other Ambulatory Visit (HOSPITAL_COMMUNITY): Payer: Self-pay | Admitting: Obstetrics and Gynecology

## 2012-02-24 DIAGNOSIS — Z1231 Encounter for screening mammogram for malignant neoplasm of breast: Secondary | ICD-10-CM

## 2012-03-03 ENCOUNTER — Observation Stay (HOSPITAL_COMMUNITY)
Admission: EM | Admit: 2012-03-03 | Discharge: 2012-03-05 | Disposition: A | Discharge status: Home / Self Care | Payer: Medicare Other | Attending: Internal Medicine | Admitting: Internal Medicine

## 2012-03-03 ENCOUNTER — Emergency Department (HOSPITAL_COMMUNITY): Payer: Medicare Other

## 2012-03-03 ENCOUNTER — Encounter (HOSPITAL_COMMUNITY): Payer: Self-pay | Admitting: *Deleted

## 2012-03-03 DIAGNOSIS — N183 Chronic kidney disease, stage 3 unspecified: Secondary | ICD-10-CM | POA: Diagnosis not present

## 2012-03-03 DIAGNOSIS — K5790 Diverticulosis of intestine, part unspecified, without perforation or abscess without bleeding: Secondary | ICD-10-CM | POA: Diagnosis present

## 2012-03-03 DIAGNOSIS — R0602 Shortness of breath: Secondary | ICD-10-CM | POA: Insufficient documentation

## 2012-03-03 DIAGNOSIS — F329 Major depressive disorder, single episode, unspecified: Secondary | ICD-10-CM

## 2012-03-03 DIAGNOSIS — K589 Irritable bowel syndrome without diarrhea: Secondary | ICD-10-CM | POA: Diagnosis present

## 2012-03-03 DIAGNOSIS — R079 Chest pain, unspecified: Principal | ICD-10-CM | POA: Insufficient documentation

## 2012-03-03 DIAGNOSIS — IMO0001 Reserved for inherently not codable concepts without codable children: Secondary | ICD-10-CM | POA: Insufficient documentation

## 2012-03-03 DIAGNOSIS — K219 Gastro-esophageal reflux disease without esophagitis: Secondary | ICD-10-CM | POA: Diagnosis present

## 2012-03-03 DIAGNOSIS — I129 Hypertensive chronic kidney disease with stage 1 through stage 4 chronic kidney disease, or unspecified chronic kidney disease: Secondary | ICD-10-CM | POA: Diagnosis not present

## 2012-03-03 DIAGNOSIS — I10 Essential (primary) hypertension: Secondary | ICD-10-CM | POA: Diagnosis not present

## 2012-03-03 DIAGNOSIS — F3289 Other specified depressive episodes: Secondary | ICD-10-CM | POA: Insufficient documentation

## 2012-03-03 DIAGNOSIS — J45909 Unspecified asthma, uncomplicated: Secondary | ICD-10-CM | POA: Diagnosis present

## 2012-03-03 DIAGNOSIS — R091 Pleurisy: Secondary | ICD-10-CM | POA: Diagnosis not present

## 2012-03-03 DIAGNOSIS — G43909 Migraine, unspecified, not intractable, without status migrainosus: Secondary | ICD-10-CM | POA: Diagnosis present

## 2012-03-03 DIAGNOSIS — Z860100 Personal history of colon polyps, unspecified: Secondary | ICD-10-CM

## 2012-03-03 DIAGNOSIS — E785 Hyperlipidemia, unspecified: Secondary | ICD-10-CM | POA: Diagnosis present

## 2012-03-03 DIAGNOSIS — K5730 Diverticulosis of large intestine without perforation or abscess without bleeding: Secondary | ICD-10-CM | POA: Diagnosis not present

## 2012-03-03 DIAGNOSIS — M797 Fibromyalgia: Secondary | ICD-10-CM | POA: Diagnosis present

## 2012-03-03 DIAGNOSIS — F32A Depression, unspecified: Secondary | ICD-10-CM | POA: Diagnosis present

## 2012-03-03 DIAGNOSIS — F341 Dysthymic disorder: Secondary | ICD-10-CM | POA: Diagnosis present

## 2012-03-03 HISTORY — DX: Diverticulosis of intestine, part unspecified, without perforation or abscess without bleeding: K57.90

## 2012-03-03 HISTORY — DX: Chronic kidney disease, unspecified: N18.9

## 2012-03-03 HISTORY — DX: Hyperlipidemia, unspecified: E78.5

## 2012-03-03 HISTORY — DX: Depression, unspecified: F32.A

## 2012-03-03 HISTORY — DX: Unspecified asthma, uncomplicated: J45.909

## 2012-03-03 HISTORY — DX: Gastro-esophageal reflux disease without esophagitis: K21.9

## 2012-03-03 HISTORY — DX: Major depressive disorder, single episode, unspecified: F32.9

## 2012-03-03 HISTORY — DX: Tremor, unspecified: R25.1

## 2012-03-03 LAB — TROPONIN I
Troponin I: <0.3 ng/mL (ref <0.30)
Troponin I: <0.3 ng/mL (ref <0.30)

## 2012-03-03 LAB — BASIC METABOLIC PANEL
BUN: 14 mg/dL (ref 6–23)
CO2: 24 mEq/L (ref 19–32)
Calcium: 9.9 mg/dL (ref 8.4–10.5)
Chloride: 101 mEq/L (ref 96–112)
Creatinine, Ser: 1 mg/dL (ref 0.50–1.10)
GFR calc Af Amer: 65 mL/min — ABNORMAL LOW (ref >90)
GFR calc non Af Amer: 56 mL/min — ABNORMAL LOW (ref >90)
Glucose, Bld: 75 mg/dL (ref 70–99)
Potassium: 3.5 mEq/L (ref 3.5–5.1)
Sodium: 137 mEq/L (ref 135–145)

## 2012-03-03 LAB — CBC
HCT: 40.2 % (ref 36.0–46.0)
Hemoglobin: 14.1 g/dL (ref 12.0–15.0)
MCH: 32.6 pg (ref 26.0–34.0)
MCHC: 35.1 g/dL (ref 30.0–36.0)
MCV: 92.8 fL (ref 78.0–100.0)
Platelets: 255 10*3/uL (ref 150–400)
RBC: 4.33 MIL/uL (ref 3.87–5.11)
RDW: 12.7 % (ref 11.5–15.5)
WBC: 7.2 10*3/uL (ref 4.0–10.5)

## 2012-03-03 LAB — URINALYSIS, ROUTINE W REFLEX MICROSCOPIC
Bilirubin Urine: NEGATIVE
Glucose, UA: NEGATIVE mg/dL
Hgb urine dipstick: NEGATIVE
Ketones, ur: NEGATIVE mg/dL
Nitrite: NEGATIVE
Protein, ur: NEGATIVE mg/dL
Specific Gravity, Urine: 1.013 (ref 1.005–1.030)
Urobilinogen, UA: 1 mg/dL (ref 0.0–1.0)
pH: 8.5 — ABNORMAL HIGH (ref 5.0–8.0)

## 2012-03-03 LAB — URINE MICROSCOPIC-ADD ON

## 2012-03-03 MED ORDER — DULOXETINE HCL 60 MG PO CPEP
60.0000 mg | ORAL_CAPSULE | Freq: Every day | ORAL | Status: DC
  Administered 2012-03-04 – 2012-03-05 (×2): 60 mg via ORAL
  Filled 2012-03-03 (×2): qty 1

## 2012-03-03 MED ORDER — ONDANSETRON HCL 4 MG PO TABS
4.0000 mg | ORAL_TABLET | Freq: Four times a day (QID) | ORAL | Status: DC | PRN
  Administered 2012-03-05: 4 mg via ORAL
  Filled 2012-03-03: qty 1

## 2012-03-03 MED ORDER — LORAZEPAM 0.5 MG PO TABS
0.5000 mg | ORAL_TABLET | Freq: Every day | ORAL | Status: DC
  Administered 2012-03-03 – 2012-03-04 (×2): 0.5 mg via ORAL
  Filled 2012-03-03 (×3): qty 1

## 2012-03-03 MED ORDER — ALBUTEROL SULFATE HFA 108 (90 BASE) MCG/ACT IN AERS
2.0000 | INHALATION_SPRAY | RESPIRATORY_TRACT | Status: DC | PRN
  Filled 2012-03-03: qty 6.7

## 2012-03-03 MED ORDER — METHAZOLAMIDE 25 MG PO TABS
25.0000 mg | ORAL_TABLET | Freq: Every day | ORAL | Status: DC
  Administered 2012-03-04 – 2012-03-05 (×2): 25 mg via ORAL
  Filled 2012-03-03 (×3): qty 1

## 2012-03-03 MED ORDER — PREGABALIN 50 MG PO CAPS
50.0000 mg | ORAL_CAPSULE | Freq: Two times a day (BID) | ORAL | Status: DC
  Administered 2012-03-03 – 2012-03-05 (×4): 50 mg via ORAL
  Filled 2012-03-03 (×4): qty 1

## 2012-03-03 MED ORDER — ACETAMINOPHEN 325 MG PO TABS: 650.0000 mg | ORAL_TABLET | Freq: Four times a day (QID) | ORAL | Status: DC | PRN

## 2012-03-03 MED ORDER — LOSARTAN POTASSIUM 50 MG PO TABS
100.0000 mg | ORAL_TABLET | Freq: Every day | ORAL | Status: DC
  Administered 2012-03-04 – 2012-03-05 (×2): 100 mg via ORAL
  Filled 2012-03-03 (×2): qty 2

## 2012-03-03 MED ORDER — ALUM & MAG HYDROXIDE-SIMETH 200-200-20 MG/5ML PO SUSP: 30.0000 mL | Freq: Four times a day (QID) | ORAL | Status: DC | PRN

## 2012-03-03 MED ORDER — ACETAMINOPHEN 650 MG RE SUPP: 650.0000 mg | Freq: Four times a day (QID) | RECTAL | Status: DC | PRN

## 2012-03-03 MED ORDER — ENOXAPARIN SODIUM 40 MG/0.4ML ~~LOC~~ SOLN
40.0000 mg | Freq: Every day | SUBCUTANEOUS | Status: DC
  Administered 2012-03-03 – 2012-03-04 (×2): 40 mg via SUBCUTANEOUS
  Filled 2012-03-03 (×3): qty 0.4

## 2012-03-03 MED ORDER — ASPIRIN EC 81 MG PO TBEC
81.0000 mg | DELAYED_RELEASE_TABLET | Freq: Every day | ORAL | Status: DC
  Administered 2012-03-04 – 2012-03-05 (×2): 81 mg via ORAL
  Filled 2012-03-03 (×3): qty 1

## 2012-03-03 MED ORDER — HYDROCODONE-ACETAMINOPHEN 7.5-325 MG PO TABS
1.0000 | ORAL_TABLET | Freq: Four times a day (QID) | ORAL | Status: DC | PRN
  Administered 2012-03-03 – 2012-03-05 (×6): 1 via ORAL
  Filled 2012-03-03 (×6): qty 1

## 2012-03-03 MED ORDER — ONDANSETRON HCL 4 MG/2ML IJ SOLN
4.0000 mg | Freq: Four times a day (QID) | INTRAMUSCULAR | Status: DC | PRN
  Administered 2012-03-04 (×2): 4 mg via INTRAVENOUS
  Filled 2012-03-03 (×2): qty 2

## 2012-03-03 MED ORDER — FLUOXETINE HCL 40 MG PO CAPS: 40.0000 mg | ORAL_CAPSULE | Freq: Every day | ORAL | Status: DC

## 2012-03-03 MED ORDER — FLUOXETINE HCL 20 MG PO CAPS
40.0000 mg | ORAL_CAPSULE | Freq: Every day | ORAL | Status: DC
  Administered 2012-03-04 – 2012-03-05 (×2): 40 mg via ORAL
  Filled 2012-03-03 (×2): qty 2

## 2012-03-03 MED ORDER — NITROGLYCERIN 0.4 MG SL SUBL: 0.4000 mg | SUBLINGUAL_TABLET | SUBLINGUAL | Status: DC | PRN

## 2012-03-03 MED ORDER — SODIUM CHLORIDE 0.9 % IV SOLN
INTRAVENOUS | Status: AC
  Administered 2012-03-03: 21:00:00 via INTRAVENOUS

## 2012-03-03 MED ORDER — PANTOPRAZOLE SODIUM 40 MG PO TBEC
40.0000 mg | DELAYED_RELEASE_TABLET | Freq: Every day | ORAL | Status: DC
  Administered 2012-03-04 – 2012-03-05 (×2): 40 mg via ORAL
  Filled 2012-03-03 (×2): qty 1

## 2012-03-03 MED ORDER — SODIUM CHLORIDE 0.9 % IJ SOLN
3.0000 mL | Freq: Two times a day (BID) | INTRAMUSCULAR | Status: DC
  Administered 2012-03-03 – 2012-03-05 (×3): 3 mL via INTRAVENOUS

## 2012-03-03 NOTE — ED Provider Notes (Signed)
History     CSN: [id number]  Arrival date & time 03/03/12  1426   First MD Initiated Contact with Patient 03/03/12 1558      Chief Complaint  Patient presents with  . Chest Pain  . Shortness of Breath    (Consider location/radiation/quality/duration/timing/severity/associated sxs/prior treatment) Patient is a 72 y.o. female presenting with chest pain and shortness of breath. The history is provided by the patient. No language interpreter was used.  Chest Pain Episode onset: Patient has had chest pain on and off for some time, but it has become worse today. She describes cooking breakfast and trying to empty the dishwasher and having very severe chest pain or shortness of breath. She rates the chest pain at a 5.  Duration of episode(s) is 15 minutes. Episode frequency: He had 2 episodes of chest pain this morning. The chest pain is unchanged. At its most intense, the pain is at 5/10. The quality of the pain is described as pressure-like. The pain does not radiate. Exacerbated by: Activity. Primary symptoms include shortness of breath. Pertinent negatives for primary symptoms include no fever.  The shortness of breath began more than 2 days ago (She's had shortness of breath on and off for some time.). The shortness of breath developed gradually. The shortness of breath is severe.  Associated symptoms include weakness. She tried nothing for the symptoms. Risk factors include lack of exercise, being elderly and sedentary lifestyle.  Her past medical history is significant for hypertension. Past medical history comments: Hypertension, fibromyalgia.    Shortness of Breath  Associated symptoms include chest pain and shortness of breath. Pertinent negatives include no fever. Past medical history comments: Hypertension, fibromyalgia..    Past Medical History  Diagnosis Date  . Fibromyalgia   . Migraines   . Hypertension   . Renal disorder     stage 3    Past Surgical History  Procedure  Date  . Total hip arthroplasty     bil  . Appendectomy   . Tonsillectomy   . Abdominal hysterectomy   . Adenoidectomy     No family history on file.  History  Substance Use Topics  . Smoking status: Never Smoker   . Smokeless tobacco: Not on file  . Alcohol Use: No    OB History    Grav Para Term Preterm Abortions TAB SAB Ect Mult Living                  Review of Systems  Constitutional: Negative for fever and chills.  HENT: Negative.   Eyes: Negative.   Respiratory: Positive for shortness of breath.   Cardiovascular: Positive for chest pain.  Gastrointestinal: Negative.   Genitourinary: Negative.   Musculoskeletal: Negative.   Skin: Negative.   Neurological: Positive for weakness.  Psychiatric/Behavioral: Negative.     Allergies  Ciprofloxacin; Erythromycin; Sulfa antibiotics; Latex; and Shellfish allergy  Home Medications   Current Outpatient Rx  Name  Route  Sig  Dispense  Refill  . CALTRATE 600+D PO   Oral   Take 1 tablet by mouth daily.         Marland Kitchen CINNAMON PO   Oral   Take 1 tablet by mouth daily.         . DULOXETINE HCL 60 MG PO CPEP   Oral   Take 60 mg by mouth daily.         Marland Kitchen FLUOXETINE HCL 40 MG PO CAPS   Oral   Take 40 mg  by mouth daily.         Marland Kitchen HYDROCODONE-ACETAMINOPHEN 7.5-325 MG PO TABS   Oral   Take 1 tablet by mouth every 6 (six) hours as needed. For pain         . LORAZEPAM 0.5 MG PO TABS   Oral   Take 0.5 mg by mouth at bedtime.         Marland Kitchen LOSARTAN POTASSIUM 100 MG PO TABS   Oral   Take 100 mg by mouth daily.         Marland Kitchen METHAZOLAMIDE 25 MG PO TABS   Oral   Take 25 mg by mouth daily.         Marland Kitchen OMEPRAZOLE 40 MG PO CPDR   Oral   Take 40 mg by mouth 2 (two) times daily.         Marland Kitchen PREGABALIN 50 MG PO CAPS   Oral   Take 50 mg by mouth 2 (two) times daily.         . SUMATRIPTAN SUCCINATE 50 MG PO TABS   Oral   Take 50 mg by mouth every 2 (two) hours as needed. For migraine         . ICAPS PO    Oral   Take 1 tablet by mouth daily.           BP 131/81  Pulse 89  Temp 97.8 F (36.6 C) (Oral)  Resp 22  SpO2 99%  Physical Exam  Nursing note and vitals reviewed. Constitutional: She is oriented to person, place, and time. She appears well-developed and well-nourished. No distress.  HENT:  Head: Normocephalic and atraumatic.  Right Ear: External ear normal.  Left Ear: External ear normal.  Mouth/Throat: Oropharynx is clear and moist.  Eyes: Conjunctivae normal and EOM are normal. Pupils are equal, round, and reactive to light.  Neck: Normal range of motion. Neck supple.  Cardiovascular: Normal rate, regular rhythm and normal heart sounds.   Pulmonary/Chest: Effort normal and breath sounds normal.  Abdominal: Soft. Bowel sounds are normal.  Musculoskeletal: Normal range of motion.  Neurological: She is alert and oriented to person, place, and time.       No sensory or motor deficit.  Skin: Skin is warm and dry.  Psychiatric: She has a normal mood and affect. Her behavior is normal.    ED Course  Procedures (including critical care time)  4:39 PM Results for orders placed during the hospital encounter of 03/03/12  CBC      Component Value Range   WBC 7.2  4.0 - 10.5 K/uL   RBC 4.33  3.87 - 5.11 MIL/uL   Hemoglobin 14.1  12.0 - 15.0 g/dL   HCT 81.1  91.4 - 78.2 %   MCV 92.8  78.0 - 100.0 fL   MCH 32.6  26.0 - 34.0 pg   MCHC 35.1  30.0 - 36.0 g/dL   RDW 95.6  21.3 - 08.6 %   Platelets 255  150 - 400 K/uL  BASIC METABOLIC PANEL      Component Value Range   Sodium 137  135 - 145 mEq/L   Potassium 3.5  3.5 - 5.1 mEq/L   Chloride 101  96 - 112 mEq/L   CO2 24  19 - 32 mEq/L   Glucose, Bld 75  70 - 99 mg/dL   BUN 14  6 - 23 mg/dL   Creatinine, Ser 5.78  0.50 - 1.10 mg/dL   Calcium 9.9  8.4 -  10.5 mg/dL   GFR calc non Af Amer 56 (*) >90 mL/min   GFR calc Af Amer 65 (*) >90 mL/min  TROPONIN I      Component Value Range   Troponin I <0.30  <0.30 ng/mL   Dg  Chest 2 View  03/03/2012  *RADIOLOGY REPORT*  Clinical Data: Chest pain shortness of breath for 2 months. Hypertension.  CHEST - 2 VIEW  Comparison: 08/01/2007  Findings: Mild hyperinflation. Retrosternal densities on the lateral view are likely due to rib ends and are unchanged.  Intra-articular loose bodies and/or heterotopic ossification about the glenohumeral joints bilaterally. Midline trachea.  Normal heart size and mediastinal contours.  Biapical pleural thickening.  Lower lobe predominant interstitial thickening is mild. No lobar consolidation.  IMPRESSION: No acute cardiopulmonary disease.  Hyperinflation with mild chronic interstitial thickening.  Question chronic bronchitis or asthma; the clinical history describes the patient as a nonsmoker.   Original Report Authenticated By: Jeronimo Greaves, M.D.     vi Dg Chest 2 View  03/03/2012  *RADIOLOGY REPORT*  Clinical Data: Chest pain shortness of breath for 2 months. Hypertension.  CHEST - 2 VIEW  Comparison: 08/01/2007  Findings: Mild hyperinflation. Retrosternal densities on the lateral view are likely due to rib ends and are unchanged.  Intra-articular loose bodies and/or heterotopic ossification about the glenohumeral joints bilaterally. Midline trachea.  Normal heart size and mediastinal contours.  Biapical pleural thickening.  Lower lobe predominant interstitial thickening is mild. No lobar consolidation.  IMPRESSION: No acute cardiopulmonary disease.  Hyperinflation with mild chronic interstitial thickening.  Question chronic bronchitis or asthma; the clinical history describes the patient as a nonsmoker.   Original Report Authenticated By: Jeronimo Greaves, M.D.    4:38 PM  Date: 03/03/2012  Rate:89  Rhythm: normal sinus rhythm  QRS Axis: right  Intervals: normal  ST/T Wave abnormalities: normal  Conduction Disutrbances:none  Narrative Interpretation: Borderline EKG  Old EKG Reviewed: none available  Results for orders placed during the  hospital encounter of 03/03/12  CBC      Component Value Range   WBC 7.2  4.0 - 10.5 K/uL   RBC 4.33  3.87 - 5.11 MIL/uL   Hemoglobin 14.1  12.0 - 15.0 g/dL   HCT 84.6  96.2 - 95.2 %   MCV 92.8  78.0 - 100.0 fL   MCH 32.6  26.0 - 34.0 pg   MCHC 35.1  30.0 - 36.0 g/dL   RDW 84.1  32.4 - 40.1 %   Platelets 255  150 - 400 K/uL  BASIC METABOLIC PANEL      Component Value Range   Sodium 137  135 - 145 mEq/L   Potassium 3.5  3.5 - 5.1 mEq/L   Chloride 101  96 - 112 mEq/L   CO2 24  19 - 32 mEq/L   Glucose, Bld 75  70 - 99 mg/dL   BUN 14  6 - 23 mg/dL   Creatinine, Ser 0.27  0.50 - 1.10 mg/dL   Calcium 9.9  8.4 - 25.3 mg/dL   GFR calc non Af Amer 56 (*) >90 mL/min   GFR calc Af Amer 65 (*) >90 mL/min  TROPONIN I      Component Value Range   Troponin I <0.30  <0.30 ng/mL   Dg Chest 2 View  03/03/2012  *RADIOLOGY REPORT*  Clinical Data: Chest pain shortness of breath for 2 months. Hypertension.  CHEST - 2 VIEW  Comparison: 08/01/2007  Findings: Mild hyperinflation. Retrosternal densities on  the lateral view are likely due to rib ends and are unchanged.  Intra-articular loose bodies and/or heterotopic ossification about the glenohumeral joints bilaterally. Midline trachea.  Normal heart size and mediastinal contours.  Biapical pleural thickening.  Lower lobe predominant interstitial thickening is mild. No lobar consolidation.  IMPRESSION: No acute cardiopulmonary disease.  Hyperinflation with mild chronic interstitial thickening.  Question chronic bronchitis or asthma; the clinical history describes the patient as a nonsmoker.   Original Report Authenticated By: Jeronimo Greaves, M.D.     Lab workup negative so far.  Dr. Earl Gala thought she might have unstable angina when he talked to her, and she has not had a chest pain workup.  Will contact [facility] to admit her for chest pain observation.  1. Chest pain    5:21 PM Case discussed with Dr. Lavera Guise --> admit to Dr. Donette Larry to a telemetry  bed.        Carleene Cooper III, MD 03/03/12 [phone]

## 2012-03-03 NOTE — ED Notes (Signed)
Pt eating dinner at this time

## 2012-03-03 NOTE — ED Notes (Signed)
Pt with acute onset sob while emptying dishwasher.  Pt has had intermittant sternal chest pain for several months.  Pt appears sob, though able to speak in complete sentences and spo2 wnl.

## 2012-03-03 NOTE — H&P (Signed)
[facility] History and Physical  BLAINE GUIFFRE ZOX:[id number] DOB: [birth year] DOA: 03/03/2012  Referring physician: ED PCP: Georgann Housekeeper, MD  Specialists: none  Chief Complaint:  Chief Complaint  Patient presents with  . Chest Pain  . Shortness of Breath     HPI: DEMONI PARMAR is a 72 y.o. female with past medical history of gastroesophageal reflux disease, hypertension, hyperlipidemia (intolerant to statins and fibrates), childhood asthma who came to the emergency room today because she has been noticing shortness of breath and chest pain with exertion. The episode that concern her to days when she got a really tight in her chest after just getting the dishes out of the washer today. By now she feels fine and she is without any chest pain. She has never smoked cigarettes and she does not have a personal history of coronary disease. She says that her asthma never required treatment as an adult. She denies any acute intercurrent illnesses. There is no reported recent travel.   Review of Systems:  Patient reports a sour stomach feel in the morning. The patient reports having more bowel movements than she would like to.  The patient reports that she has very bad depression The patient reports migraine headaches The patient denies anorexia, fever, weight loss,, vision loss, decreased hearing, hoarseness, syncope, peripheral edema, balance deficits, hemoptysis, abdominal pain, melena, hematochezia, hematuria, incontinence, genital sores, muscle weakness, suspicious skin lesions, transient blindness, difficulty walking, unusual weight change, abnormal bleeding, enlarged lymph nodes, angioedema, and breast masses.    Past Medical History  Diagnosis Date  . Fibromyalgia   . Migraines   . Hypertension   . Hyperlipidemia   . GERD (gastroesophageal reflux disease)   . Asthma   . Depression   . Diverticulosis   . Occasional tremors    Past Surgical History  Procedure Date    . Total hip arthroplasty     bil  . Appendectomy   . Tonsillectomy   . Abdominal hysterectomy   . Adenoidectomy    Social History:  reports that she has never smoked. She does not have any smokeless tobacco history on file. She reports that she does not drink alcohol or use illicit drugs. The patient used to work in a [employer] The patient lives at home with her husband  Allergies  Allergen Reactions  . Ciprofloxacin Nausea Only  . Erythromycin Other (See Comments)    Stomach burning  . Sulfa Antibiotics Nausea Only  . Latex Rash  . Shellfish Allergy Rash    Family History  Problem Relation Age of Onset  . Stroke Maternal Grandmother   . Heart failure Maternal Grandfather      Prior to Admission medications   Medication Sig Start Date End Date Taking? Authorizing Provider  Calcium Carbonate-Vitamin D (CALTRATE 600+D PO) Take 1 tablet by mouth daily.   Yes Historical Provider, MD  CINNAMON PO Take 1 tablet by mouth daily.   Yes Historical Provider, MD  DULoxetine (CYMBALTA) 60 MG capsule Take 60 mg by mouth daily.   Yes Historical Provider, MD  FLUoxetine (PROZAC) 40 MG capsule Take 40 mg by mouth daily.   Yes Historical Provider, MD  HYDROcodone-acetaminophen (NORCO) 7.5-325 MG per tablet Take 1 tablet by mouth every 6 (six) hours as needed. For pain   Yes Historical Provider, MD  LORazepam (ATIVAN) 0.5 MG tablet Take 0.5 mg by mouth at bedtime.   Yes Historical Provider, MD  losartan (COZAAR) 100 MG tablet Take 100 mg by mouth  daily.   Yes Historical Provider, MD  methazolamide (NEPTAZANE) 25 MG tablet Take 25 mg by mouth daily.   Yes Historical Provider, MD  omeprazole (PRILOSEC) 40 MG capsule Take 40 mg by mouth 2 (two) times daily.   Yes Historical Provider, MD  pregabalin (LYRICA) 50 MG capsule Take 50 mg by mouth 2 (two) times daily.   Yes Historical Provider, MD  SUMAtriptan (IMITREX) 50 MG tablet Take 50 mg by mouth every 2 (two) hours as needed. For migraine    Yes Historical Provider, MD  Multiple Vitamins-Minerals (ICAPS PO) Take 1 tablet by mouth daily.    Historical Provider, MD   Physical Exam: Filed Vitals:   03/03/12 1434  BP: 131/81  Pulse: 89  Temp: 97.8 F (36.6 C)  TempSrc: Oral  Resp: 22  SpO2: 99%     General:  Alert and oriented x3  Eyes: Pupil equal round react to light accommodation, extraocular movement intact  ENT: Clear pharynx without exudates  Neck: No JVD no thyromegaly  Cardiovascular: Regular rate and rhythm without murmurs rubs or gallops  Respiratory: Clear to auscultation bilaterally without wheeze rhonchi crackles  Abdomen: Soft nontender nondistended bowel sounds present  Skin: Pale dry without rashes  Musculoskeletal: Intact muscle bulk and tone  Psychiatric: Euthymic  Neurologic: Cranial nerves 2-12 intact, strength 5 out of 5 in all 4, sensation intact  Labs on Admission:  Basic Metabolic Panel:  Lab 03/03/12 9604  NA 137  K 3.5  CL 101  CO2 24  GLUCOSE 75  BUN 14  CREATININE 1.00  CALCIUM 9.9  MG --  PHOS --   Liver Function Tests: No results found for this basename: AST:5,ALT:5,ALKPHOS:5,BILITOT:5,PROT:5,ALBUMIN:5 in the last 168 hours No results found for this basename: LIPASE:5,AMYLASE:5 in the last 168 hours No results found for this basename: AMMONIA:5 in the last 168 hours CBC:  Lab 03/03/12 1431  WBC 7.2  NEUTROABS --  HGB 14.1  HCT 40.2  MCV 92.8  PLT 255   Cardiac Enzymes:  Lab 03/03/12 1432  CKTOTAL --  CKMB --  CKMBINDEX --  TROPONINI <0.30    BNP (last 3 results) No results found for this basename: PROBNP:3 in the last 8760 hours CBG: No results found for this basename: GLUCAP:5 in the last 168 hours  Radiological Exams on Admission: Dg Chest 2 View  03/03/2012  *RADIOLOGY REPORT*  Clinical Data: Chest pain shortness of breath for 2 months. Hypertension.  CHEST - 2 VIEW  Comparison: 08/01/2007  Findings: Mild hyperinflation. Retrosternal densities  on the lateral view are likely due to rib ends and are unchanged.  Intra-articular loose bodies and/or heterotopic ossification about the glenohumeral joints bilaterally. Midline trachea.  Normal heart size and mediastinal contours.  Biapical pleural thickening.  Lower lobe predominant interstitial thickening is mild. No lobar consolidation.  IMPRESSION: No acute cardiopulmonary disease.  Hyperinflation with mild chronic interstitial thickening.  Question chronic bronchitis or asthma; the clinical history describes the patient as a nonsmoker.   Original Report Authenticated By: Jeronimo Greaves, M.D.     EKG: Independently reviewed. Normal sinus rhythm, no ST depression, no ST elevation, no negative T waves, minor right bundle branch block  Assessment/Plan Principal Problem:  *Chest pain Active Problems:  ANXIETY DEPRESSION  GERD  COLONIC POLYPS, ADENOMATOUS, HX OF  Fibromyalgia  Hypertension  Hyperlipidemia  GERD (gastroesophageal reflux disease)  Asthma  Depression  Diverticulosis   1. Chest pain and dyspnea on exertion-concerning for possible unstable angina. Alternative the patient could  have exercise-induced asthma. Doubt that her symptoms are only from gastroesophageal reflux disease. Also the symptoms do not  Seem to be characteristic for fibromyalgia induced chest pain. Plan to observe the patient on telemetry, cycle cardiac enzymes and refer her for an outpatient stress test. If the patient continues to have symptoms in the hospital we will consult cardiology. We will prescribe aspirin and nitroglycerin as needed 2. Severe depression-continue Prozac and Cymbalta without changes 3. Gastro esophageal reflux disease-continue proton pump inhibitor without changes 4. Fibromyalgia-continue Lyrica without changes   Code Status: Full code Family Communication: Husband at bedside Disposition Plan: home   Time spent: 35 minutes  Kataleyah Carducci [facility] Pager [phone]  If 7PM-7AM,  please contact night-coverage [email or web address] Password [facility] 03/03/2012, 5:46 PM

## 2012-03-04 ENCOUNTER — Encounter (HOSPITAL_COMMUNITY): Payer: Self-pay | Admitting: Physician Assistant

## 2012-03-04 DIAGNOSIS — F341 Dysthymic disorder: Secondary | ICD-10-CM | POA: Diagnosis not present

## 2012-03-04 DIAGNOSIS — K219 Gastro-esophageal reflux disease without esophagitis: Secondary | ICD-10-CM | POA: Diagnosis not present

## 2012-03-04 DIAGNOSIS — R079 Chest pain, unspecified: Secondary | ICD-10-CM

## 2012-03-04 DIAGNOSIS — G43909 Migraine, unspecified, not intractable, without status migrainosus: Secondary | ICD-10-CM | POA: Diagnosis present

## 2012-03-04 LAB — LIPID PANEL
Cholesterol: 254 mg/dL — ABNORMAL HIGH (ref 0–200)
HDL: 30 mg/dL — ABNORMAL LOW (ref >39)
LDL Cholesterol: 158 mg/dL — ABNORMAL HIGH (ref 0–99)
Total CHOL/HDL Ratio: 8.5 RATIO
Triglycerides: 328 mg/dL — ABNORMAL HIGH (ref <150)
VLDL: 66 mg/dL — ABNORMAL HIGH (ref 0–40)

## 2012-03-04 LAB — TROPONIN I: Troponin I: <0.3 ng/mL (ref <0.30)

## 2012-03-04 MED ORDER — SUMATRIPTAN SUCCINATE 50 MG PO TABS
50.0000 mg | ORAL_TABLET | Freq: Once | ORAL | Status: DC
  Filled 2012-03-04: qty 1

## 2012-03-04 MED ORDER — REGADENOSON 0.4 MG/5ML IV SOLN
0.4000 mg | Freq: Once | INTRAVENOUS | Status: AC
  Administered 2012-03-05: 0.4 mg via INTRAVENOUS
  Filled 2012-03-04: qty 5

## 2012-03-04 MED ORDER — KETOROLAC TROMETHAMINE 15 MG/ML IJ SOLN
15.0000 mg | Freq: Four times a day (QID) | INTRAMUSCULAR | Status: DC | PRN
  Administered 2012-03-04: 15 mg via INTRAVENOUS
  Filled 2012-03-04: qty 1

## 2012-03-04 NOTE — Progress Notes (Signed)
Pt developed left sided chest pressure, radiated to her left upper back. Pt did state pain was worse when she took a deep breath. No SOB, diaphoresis, nausea associated with pain. She was laying in bed when pressure started. EKG showed NSR. BP 121/63, HR 81, O2 97%. Pt was offered Nitroglycerin, but refused due to possible headache.   Pressure subsided on its own ~15 minutes later. Will continue to monitor.

## 2012-03-04 NOTE — Progress Notes (Signed)
Assessment/Plan: Principal Problem:  *Chest pain - this is highly suspicious for unstable angina. No evidence of myocardial damage so far. Discussed with cardiol who will risk stratify. She is on obs status and may need conversion to adm status depending on their recommendations.  Active Problems:  ANXIETY DEPRESSION  GERD  COLONIC POLYPS, ADENOMATOUS, HX OF  Fibromyalgia  Hypertension  Hyperlipidemia  GERD (gastroesophageal reflux disease)  Asthma  Depression  Diverticulosis  Migraine headache - I told her that the could not take triptan during time in which we think she has cardiac ischemia. She hardly believed me. I told her that even if she goes home for further outpatient workup she could not take triptan until dx was made in regard to chest pain.    Subjective: Overnight has continued to have some episodes of chest tightness. Refused NTG due to concerns about headache. Also, c/o migraine and is frustrated that we will not give her sumatriptan which she has taken intermittently for years.   Notes that she is breathing better since admit.   Objective:  Vital Signs: Filed Vitals:   03/03/12 1434 03/03/12 1730 03/03/12 2100 03/04/12 0538  BP: 131/81 129/79 111/70 127/66  Pulse: 89 87 90 86  Temp: 97.8 F (36.6 C)  98.4 F (36.9 C) 97.7 F (36.5 C)  TempSrc: Oral  Oral Oral  Resp: 22 21 20 18   Weight:    73 kg (160 lb 15 oz)  SpO2: 99% 100% 96% 98%     EXAM: Appears comfortable.   No intake or output data in the 24 hours ending 03/04/12 0950  Lab Results:  Basename 03/03/12 1431  NA 137  K 3.5  CL 101  CO2 24  GLUCOSE 75  BUN 14  CREATININE 1.00  CALCIUM 9.9  MG --  PHOS --   No results found for this basename: AST:2,ALT:2,ALKPHOS:2,BILITOT:2,PROT:2,ALBUMIN:2 in the last 72 hours No results found for this basename: LIPASE:2,AMYLASE:2 in the last 72 hours  Basename 03/03/12 1431  WBC 7.2  NEUTROABS --  HGB 14.1  HCT 40.2  MCV 92.8  PLT 255     Basename 03/04/12 0304 03/03/12 2014 03/03/12 1432  CKTOTAL -- -- --  CKMB -- -- --  CKMBINDEX -- -- --  TROPONINI <0.30 <0.30 <0.30   No components found with this basename: POCBNP:3 No results found for this basename: DDIMER:2 in the last 72 hours No results found for this basename: HGBA1C:2 in the last 72 hours No results found for this basename: CHOL:2,HDL:2,LDLCALC:2,TRIG:2,CHOLHDL:2,LDLDIRECT:2 in the last 72 hours No results found for this basename: TSH,T4TOTAL,FREET3,T3FREE,THYROIDAB in the last 72 hours No results found for this basename: VITAMINB12:2,FOLATE:2,FERRITIN:2,TIBC:2,IRON:2,RETICCTPCT:2 in the last 72 hours  Studies/Results: Dg Chest 2 View  03/03/2012  *RADIOLOGY REPORT*  Clinical Data: Chest pain shortness of breath for 2 months. Hypertension.  CHEST - 2 VIEW  Comparison: 08/01/2007  Findings: Mild hyperinflation. Retrosternal densities on the lateral view are likely due to rib ends and are unchanged.  Intra-articular loose bodies and/or heterotopic ossification about the glenohumeral joints bilaterally. Midline trachea.  Normal heart size and mediastinal contours.  Biapical pleural thickening.  Lower lobe predominant interstitial thickening is mild. No lobar consolidation.  IMPRESSION: No acute cardiopulmonary disease.  Hyperinflation with mild chronic interstitial thickening.  Question chronic bronchitis or asthma; the clinical history describes the patient as a nonsmoker.   Original Report Authenticated By: Jeronimo Greaves, M.D.    Medications: Medications administered in the last 24 hours reviewed.  Current Medication List reviewed.  LOS: 1 day   [facility] @ [facility] [phone]) 03/04/2012, 9:50 AM

## 2012-03-04 NOTE — Progress Notes (Signed)
Utilization review completed.  

## 2012-03-04 NOTE — Consult Note (Signed)
CARDIOLOGY CONSULT NOTE   Patient ID: [name] MRN: [id number] DOB/AGE: [birth year] 72 y.o.  Admit date: 03/03/2012  Primary Physician   Georgann Housekeeper, MD Primary Cardiologist   New Reason for Consultation   Chest pain  NWG:NFAOZHY CIA GARRETSON is a 72 y.o. female with no history of CAD. She was admitted with chest pain and cardiology was asked to evaluate her.  [name] has been having episodes of chest pain for over a year. They occur with and without exertion. She feels they occur regularly with her daily exertion. She woke around the dry weight twice and has significant dyspnea by the time she is finished. After she stops and sestamibi, she will get sub-xiphoid pain that she describes as a dull pain. It reaches a 7/10. It is worse with deep inspiration. It lasts for about an hour. Her shortness of breath will improve and resolve as the pain resolves. There is never nausea or diaphoresis associated with the pain. She does not know how many episodes of this pain she has had. She has never been evaluated for it.  Yesterday, she was loading the dishwasher and developed shortness of breath, followed by her usual chest pain. She felt the shortness of breath was worse than usual and she checked her blood pressure which was significantly elevated while she was short of breath. After her shortness of breath resolved, her blood pressure returned to normal. She feels the pain lasted about an hour. She was concerned about her symptoms and called her family physician who requested she come to the emergency room. By the time she arrived to the emergency room, she was pain-free. She had one episode of pain last p.m. that started at rest and radiated towards the left side of her chest which was new. The nursing notes document this pain and stated lasted about 15 minutes with no associated symptoms. The patient refused nitroglycerin and did not require any other medical therapy. Currently she has no  chest pain but complains of a migraine.   Past Medical History  Diagnosis Date  . Fibromyalgia   . Migraines   . Hypertension   . Hyperlipidemia   . GERD (gastroesophageal reflux disease)   . Asthma   . Depression   . Diverticulosis   . Occasional tremors   . Chronic kidney disease     CKD Stage 3     Past Surgical History  Procedure Date  . Total hip arthroplasty     bil  . Appendectomy   . Tonsillectomy   . Abdominal hysterectomy   . Adenoidectomy     Allergies  Allergen Reactions  . Ciprofloxacin Nausea Only  . Erythromycin Other (See Comments)    Stomach burning  . Sulfa Antibiotics Nausea Only  . Latex Rash  . Shellfish Allergy Rash    I have reviewed the patient's current medications    . aspirin EC  81 mg Oral Daily  . DULoxetine  60 mg Oral Daily  . enoxaparin (LOVENOX) injection  40 mg Subcutaneous QHS  . FLUoxetine  40 mg Oral Daily  . LORazepam  0.5 mg Oral QHS  . losartan  100 mg Oral Daily  . methazolamide  25 mg Oral Daily  . pantoprazole  40 mg Oral Daily  . pregabalin  50 mg Oral BID  . sodium chloride  3 mL Intravenous Q12H  . SUMAtriptan  50 mg Oral Once     acetaminophen, acetaminophen, albuterol, alum & mag hydroxide-simeth, HYDROcodone-acetaminophen, ketorolac, nitroGLYCERIN,  ondansetron (ZOFRAN) IV, ondansetron  Medication Sig  Calcium Carbonate-Vitamin D (CALTRATE 600+D PO) Take 1 tablet by mouth daily.  CINNAMON PO Take 1 tablet by mouth daily.  DULoxetine (CYMBALTA) 60 MG capsule Take 60 mg by mouth daily.  FLUoxetine (PROZAC) 40 MG capsule Take 40 mg by mouth daily.  HYDROcodone-acetaminophen (NORCO) 7.5-325 MG per tablet Take 1 tablet by mouth every 6 (six) hours as needed. For pain  LORazepam (ATIVAN) 0.5 MG tablet Take 0.5 mg by mouth at bedtime.  losartan (COZAAR) 100 MG tablet Take 100 mg by mouth daily.  methazolamide (NEPTAZANE) 25 MG tablet Take 25 mg by mouth daily.  omeprazole (PRILOSEC) 40 MG capsule Take 40 mg by  mouth 2 (two) times daily.  pregabalin (LYRICA) 50 MG capsule Take 50 mg by mouth 2 (two) times daily.  SUMAtriptan (IMITREX) 50 MG tablet Take 50 mg by mouth every 2 hours as needed. For migraine  Multiple Vitamins-Minerals (ICAPS PO) Take 1 tablet by mouth daily.     History   Social History  . Marital Status: Married    Spouse Name: [name]    Number of Children: [name]  . Years of Education: [name]   Occupational History  . Retired    Social History Main Topics  . Smoking status: Never Smoker   . Smokeless tobacco: Never Used  . Alcohol Use: No  . Drug Use: No  . Sexually Active: No   Other Topics Concern  . Not on file   Social History Narrative  . Lives with husband, walks daily around her driveway.     Family History  Problem Relation Age of Onset  . Stroke Maternal Grandmother   . Heart failure Maternal Grandfather   . Coronary artery disease Paternal Grandmother   . Coronary artery disease Paternal Grandfather      ROS: No melena, hematuria. No recent illnesses, fevers or chills. Chronic musculoskeletal pain issues, at baseline. She has had a stress test or other ischemic evaluation. Full 14 point review of systems complete and found to be negative unless listed above.  Physical Exam: Blood pressure 127/66, pulse 86, temperature 97.7 F (36.5 C), temperature source Oral, resp. rate 18, weight 160 lb 15 oz (73 kg), SpO2 98.00%.  General: Well developed, well nourished, female in no acute distress Head: Eyes PERRLA, No xanthomas.   Normocephalic and atraumatic, oropharynx without edema or exudate. Dentition: poor Lungs: Clear bilaterally, chest wall not tender Heart: HRRR S1 S2, no rub/gallop, no murmur. pulses are 2+ all 4 extrem.   Neck: No carotid bruits. No lymphadenopathy.  JVD not elevated. Abdomen: Bowel sounds present, abdomen soft and non-tender without masses or hernias noted. Msk:  No spine or cva tenderness. No weakness, no joint deformities or  effusions. Extremities: No clubbing or cyanosis. No edema.  Neuro: Alert and oriented X 3. No focal deficits noted. She has a tremor previously evaluated by neurology that is not present at rest, she was told it was an essential tremor. Psych:  Good affect, responds appropriately but seems a little anxious Skin: No rashes or lesions noted.  Labs:   Lab Results  Component Value Date   WBC 7.2 03/03/2012   HGB 14.1 03/03/2012   HCT 40.2 03/03/2012   MCV 92.8 03/03/2012   PLT 255 03/03/2012   No results found for this basename: INR in the last 72 hours   Lab 03/03/12 1431  NA 137  K 3.5  CL 101  CO2 24  BUN 14  CREATININE 1.00  CALCIUM 9.9  PROT --  BILITOT --  ALKPHOS --  ALT --  AST --  GLUCOSE 75   No results found for this basename: mg    Basename 03/04/12 0304 03/03/12 2014 03/03/12 1432  CKTOTAL -- -- --  CKMB -- -- --  TROPONINI <0.30 <0.30 <0.30    No results found for this basename: CHOL,  HDL,  LDLCALC,  TRIG    ECG:  03-Mar-2012 21:41:49 [facility] ROUTINE RECORD Normal sinus rhythm Normal ECG No significant change since last tracing 36mm/s 80mm/mV 100Hz  8.0.1 12SL 241 HD CID: 1 Referred by: Confirmed By: Olga Millers MD Vent. rate 81 BPM PR interval 134 ms QRS duration 88 ms QT/QTc 408/473 ms P-R-T axes 67 93 75  Radiology:  Dg Chest 2 View 03/03/2012  *RADIOLOGY REPORT*  Clinical Data: Chest pain shortness of breath for 2 months. Hypertension.  CHEST - 2 VIEW  Comparison: 08/01/2007  Findings: Mild hyperinflation. Retrosternal densities on the lateral view are likely due to rib ends and are unchanged.  Intra-articular loose bodies and/or heterotopic ossification about the glenohumeral joints bilaterally. Midline trachea.  Normal heart size and mediastinal contours.  Biapical pleural thickening.  Lower lobe predominant interstitial thickening is mild. No lobar consolidation.  IMPRESSION: No acute cardiopulmonary disease.   Hyperinflation with mild chronic interstitial thickening.  Question chronic bronchitis or asthma; the clinical history describes the patient as a nonsmoker.   Original Report Authenticated By: Jeronimo Greaves, M.D.     ASSESSMENT AND PLAN:   The patient was seen today by Dr Jens Som, the patient evaluated and the data reviewed.  Principal Problem:  *Chest pain - symptoms have occurred at rest and with exertion. Her cardiac enzymes have remained negative and the ECG is not acute. She is on ASA. MD advise on stress testing vs cath, and if stress test is OK as OP or should be done as in-pt. Lipid profile is pending  Otherwise, per primary MD Active Problems:  ANXIETY DEPRESSION  GERD  COLONIC POLYPS, ADENOMATOUS, HX OF  Fibromyalgia  Hypertension  Hyperlipidemia  GERD (gastroesophageal reflux disease)  Asthma  Depression  Diverticulosis  Migraine headache   Signed: Theodore Demark 03/04/2012, 10:37 AM Co-Sign MD As above, patient seen and examined. Briefly she is a 72 year old female with past medical history of hypertension, hyperlipidemia, fibromyalgia, gastroesophageal reflux disease that I am asked to evaluate for chest pain. She has had intermittent chest pain for several years. The pain is substernal in distribution the size of a quarter. It does not radiate. It occurs both with exertion and rest. No associated nausea or diaphoresis but she does have shortness of breath. The pain increases with inspiration and certain movements by her report. She also has dyspnea on exertion. She had an hour-long episode yesterday and was admitted. Enzymes negative. Electrocardiogram without acute ST changes. Symptoms are somewhat atypical. Plan Myoview in the morning for risk stratification. Olga Millers 11:16 AM

## 2012-03-05 ENCOUNTER — Observation Stay (HOSPITAL_COMMUNITY): Payer: Medicare Other

## 2012-03-05 DIAGNOSIS — F341 Dysthymic disorder: Secondary | ICD-10-CM | POA: Diagnosis not present

## 2012-03-05 DIAGNOSIS — K219 Gastro-esophageal reflux disease without esophagitis: Secondary | ICD-10-CM | POA: Diagnosis not present

## 2012-03-05 DIAGNOSIS — R079 Chest pain, unspecified: Secondary | ICD-10-CM

## 2012-03-05 LAB — GLUCOSE, CAPILLARY: Glucose-Capillary: 101 mg/dL — ABNORMAL HIGH (ref 70–99)

## 2012-03-05 MED ORDER — TECHNETIUM TC 99M SESTAMIBI GENERIC - CARDIOLITE
10.0000 | Freq: Once | INTRAVENOUS | Status: AC | PRN
  Administered 2012-03-05: 10 via INTRAVENOUS

## 2012-03-05 MED ORDER — TECHNETIUM TC 99M SESTAMIBI GENERIC - CARDIOLITE
30.0000 | Freq: Once | INTRAVENOUS | Status: AC | PRN
  Administered 2012-03-05: 30 via INTRAVENOUS

## 2012-03-05 NOTE — Progress Notes (Addendum)
As per D Dunn, to give patient her BP meds while she is down here. Called nurse to send her meds.

## 2012-03-05 NOTE — Progress Notes (Signed)
Subjective: Pt c/o HA No CP/ SOB  Objective: Vital signs in last 24 hours: Temp:  [97.9 F (36.6 C)-98.3 F (36.8 C)] 97.9 F (36.6 C) (01/20 0500) Pulse Rate:  [76-78] 76  (01/20 0500) Resp:  [18] 18  (01/20 0500) BP: (107-141)/(63-83) 141/73 mmHg (01/20 0500) SpO2:  [95 %-98 %] 96 % (01/20 0500) Weight:  [73.1 kg (161 lb 2.5 oz)] 73.1 kg (161 lb 2.5 oz) (01/20 0500) Weight change: 0.1 kg (3.5 oz) Last BM Date: 03/02/12  Intake/Output from previous day:   Intake/Output this shift:    General appearance: alert Resp: clear to auscultation bilaterally Cardio: regular rate and rhythm  Lab Results:  Basename 03/03/12 1431  WBC 7.2  HGB 14.1  HCT 40.2  PLT 255   BMET  Basename 03/03/12 1431  NA 137  K 3.5  CL 101  CO2 24  GLUCOSE 75  BUN 14  CREATININE 1.00  CALCIUM 9.9    Studies/Results: Dg Chest 2 View  03/03/2012  *RADIOLOGY REPORT*  Clinical Data: Chest pain shortness of breath for 2 months. Hypertension.  CHEST - 2 VIEW  Comparison: 08/01/2007  Findings: Mild hyperinflation. Retrosternal densities on the lateral view are likely due to rib ends and are unchanged.  Intra-articular loose bodies and/or heterotopic ossification about the glenohumeral joints bilaterally. Midline trachea.  Normal heart size and mediastinal contours.  Biapical pleural thickening.  Lower lobe predominant interstitial thickening is mild. No lobar consolidation.  IMPRESSION: No acute cardiopulmonary disease.  Hyperinflation with mild chronic interstitial thickening.  Question chronic bronchitis or asthma; the clinical history describes the patient as a nonsmoker.   Original Report Authenticated By: Jeronimo Greaves, M.D.     Medications: I have reviewed the patient's current medications.  Assessment/Plan: CP/ r/o for MI- lab/ telemetry ok- Myoview today If negative d/c home BP ok Chronic Asthma/ Bronchitis Chronic HA/ Anxeity/ GERD/ Depression/ fibromylagia: ok   LOS: 2 days    [name] 03/05/2012, 8:04 AM

## 2012-03-05 NOTE — Discharge Summary (Signed)
Physician Discharge Summary  Patient ID: [name] MRN: [id number] DOB/AGE: 1940/04/20 72 y.o.  Admit date: 03/03/2012 Discharge date: 03/05/2012  Admission Diagnoses:  Discharge Diagnoses:  Principal Problem:  *Chest pain-rule out for MI, one is negative chest x-ray negative EKG negative, nuclear stress test negative Active Problems:  ANXIETY DEPRESSION  GERD  COLONIC POLYPS, ADENOMATOUS, HX OF  Fibromyalgia  Hypertension  Hyperlipidemia  GERD (gastroesophageal reflux disease)  Asthma  Depression  Diverticulosis  Migraine headache   Discharged Condition: good  Hospital Course: 72 years old female with multiple medical problems including hypertension dyslipidemia asthma depression and fibromyalgia presented with chest pain and some shortness of breath. Problem #1 chest pain patient having nonspecific chest discomfort on and off: she described that she could not take a deep breath. Seen in the emergency room admitted. She was admitted to telemetry normal sinus rhythm. Her cardiac markers and troponins were negative chest x-ray was negative EKG was unremarkable She was seen by the cardiologist because of the risk factors she underwent nuclear stress test which was negative for any ischemia. Nontypical chest pain. Differential includes GERD anxiety Hypertension mildly elevated blood pressure continue same medication  history of fibromyalgia and chronic pain with major depression continue her medications. GERD continue on PPI Abdominal Urinalysis, no evidence of UTI. Chronic headaches patient had some headaches she did take Imitrex at home because of the chest pain and stress test for Imitrex was on hold she'll continue that at home. She did receive Vicodin in the hospital.    Consults: cardiology  Significant Diagnostic Studies: labs: Blood chemistries, blood counts, cardiac markers negative, radiology: CXR: normal and nuclear medicine: nuclear imaging of the cardiac  stress test negative  Treatments: procedures: cardiac stress test  Discharge Exam: Blood pressure 150/94, pulse 92, temperature 97.9 F (36.6 C), temperature source Oral, resp. rate 18, weight 73.1 kg (161 lb 2.5 oz), SpO2 96.00%. General appearance: alert Resp: clear to auscultation bilaterally Cardio: regular rate and rhythm GI: soft, non-tender; bowel sounds normal; no masses,  no organomegaly  Disposition: 01-Home or Self Care  Discharge Orders    Future Appointments: Provider: Department: Dept Phone: Center:   04/02/2012 1:30 PM [facility] 1 [facility] [place] [phone]     Future Orders Please Complete By Expires   Diet - low sodium heart healthy      Increase activity slowly          Medication List     As of 03/05/2012  5:27 PM    TAKE these medications         CALTRATE 600+D PO   Take 1 tablet by mouth daily.      CINNAMON PO   Take 1 tablet by mouth daily.      DULoxetine 60 MG capsule   Commonly known as: CYMBALTA   Take 60 mg by mouth daily.      FLUoxetine 40 MG capsule   Commonly known as: PROZAC   Take 40 mg by mouth daily.      HYDROcodone-acetaminophen 7.5-325 MG per tablet   Commonly known as: NORCO   Take 1 tablet by mouth every 6 (six) hours as needed. For pain      ICAPS PO   Take 1 tablet by mouth daily.      LORazepam 0.5 MG tablet   Commonly known as: ATIVAN   Take 0.5 mg by mouth at bedtime.      losartan 100 MG tablet   Commonly  known as: COZAAR   Take 100 mg by mouth daily.      methazolamide 25 MG tablet   Commonly known as: NEPTAZANE   Take 25 mg by mouth daily.      omeprazole 40 MG capsule   Commonly known as: PRILOSEC   Take 40 mg by mouth 2 (two) times daily.      pregabalin 50 MG capsule   Commonly known as: LYRICA   Take 50 mg by mouth 2 (two) times daily.      SUMAtriptan 50 MG tablet   Commonly known as: IMITREX   Take 50 mg by mouth every 2 (two) hours as needed. For migraine             Follow-up Information    Follow up with [facility], [facility]. In 2 weeks.   Contact information:   [address] [place] [address] [phone]          Signed: Georgann Housekeeper 03/05/2012, 5:27 PM

## 2012-03-05 NOTE — Progress Notes (Signed)
  Patient: [name] / Admit Date: 03/03/2012 / Date of Encounter: 03/05/2012, 9:53 AM   Subjective  No chest pain this AM; no dyspnea   Objective    Physical Exam: Filed Vitals:   03/05/12 0500  BP: 141/73  Pulse: 76  Temp: 97.9 F (36.6 C)  Resp: 18   General: Well developed, well nourished, in no acute distress. Head: Normal Neck: Supple Lungs: CTA Heart: RRR S1 S2 without murmurs, rubs, or gallops.  Abdomen: Soft, non-tender, non-distended with normoactive bowel sounds.  Extremities: No edema.   Neuro: Alert and oriented X 3. Moves all extremities spontaneously.     Intake/Output Summary (Last 24 hours) at 03/05/12 0953 Last data filed at 03/05/12 0849  Gross per 24 hour  Intake      0 ml  Output      0 ml  Net      0 ml    Inpatient Medications:    . aspirin EC  81 mg Oral Daily  . DULoxetine  60 mg Oral Daily  . enoxaparin (LOVENOX) injection  40 mg Subcutaneous QHS  . FLUoxetine  40 mg Oral Daily  . LORazepam  0.5 mg Oral QHS  . losartan  100 mg Oral Daily  . methazolamide  25 mg Oral Daily  . pantoprazole  40 mg Oral Daily  . pregabalin  50 mg Oral BID  . regadenoson  0.4 mg Intravenous Once  . sodium chloride  3 mL Intravenous Q12H  . SUMAtriptan  50 mg Oral Once    Labs:  [facility] 03/03/12 1431  NA 137  K 3.5  CL 101  CO2 24  GLUCOSE 75  BUN 14  CREATININE 1.00  CALCIUM 9.9  MG --  PHOS --    Basename 03/03/12 1431  WBC 7.2  NEUTROABS --  HGB 14.1  HCT 40.2  MCV 92.8  PLT 255    Basename 03/04/12 0304 03/03/12 2014 03/03/12 1432  CKTOTAL -- -- --  CKMB -- -- --  TROPONINI <0.30 <0.30 <0.30    Basename 03/04/12 0304  CHOL 254*  HDL 30*  LDLCALC 158*  TRIG 328*  CHOLHDL 8.5    Radiology/Studies:  Dg Chest 2 View 03/03/2012  *RADIOLOGY REPORT*  Clinical Data: Chest pain shortness of breath for 2 months. Hypertension.  CHEST - 2 VIEW  Comparison: 08/01/2007  Findings: Mild hyperinflation. Retrosternal densities on  the lateral view are likely due to rib ends and are unchanged.  Intra-articular loose bodies and/or heterotopic ossification about the glenohumeral joints bilaterally. Midline trachea.  Normal heart size and mediastinal contours.  Biapical pleural thickening.  Lower lobe predominant interstitial thickening is mild. No lobar consolidation.  IMPRESSION: No acute cardiopulmonary disease.  Hyperinflation with mild chronic interstitial thickening.  Question chronic bronchitis or asthma; the clinical history describes the patient as a nonsmoker.   Original Report Authenticated By: Jeronimo Greaves, M.D.      Assessment and Plan    1. Chest pain, somewhat atypical - Myoview results pending. Cath if abnormal.  2. Asthma - not wheezing or acutely decompensated. 3. HTN - controlled. 4. Hyperlipidemia - not well controlled. Consider statin initiation/LFTs with f/u with PCP. 5. Abnormal UA - per IM.  Signed, Olga Millers, MD

## 2012-03-05 NOTE — Progress Notes (Signed)
Utilization review completed.  

## 2012-03-05 NOTE — Progress Notes (Signed)
CBG 101. Patient complaint of blurry vision (having headache, history migraines). Is shaky. Seen by D Dunn PA BP 150/94 [id number]

## 2012-03-05 NOTE — Progress Notes (Addendum)
While waiting for her 2nd set of pictures, pt noted "fuzzy" vision and sense of shakiness. She is anxious about getting off schedule with her home medicines including BP med. BP 150/94, HR 92, CBG 101. Neuro exam nonfocal except for resting tremor which has been chronic for patient over last few months. Point-to-point in tact, visual acuity normal bilaterally, EOMI, strength 5/5 throughout and no facial asymmetry. Will ask nursing to obtain her scheduled medicines including Cozaar down to nuclear medicine while she finishes her testing and observe. Instructed pt to let us know if blurry vision worsens or does not improve.  Dayna Dunn PA-C

## 2012-03-05 NOTE — Progress Notes (Signed)
Lexiscan Myoview completed without complications. Await images. Kingson Lohmeyer PA-C

## 2012-03-06 DIAGNOSIS — R079 Chest pain, unspecified: Secondary | ICD-10-CM | POA: Diagnosis not present

## 2012-03-06 DIAGNOSIS — F341 Dysthymic disorder: Secondary | ICD-10-CM | POA: Diagnosis not present

## 2012-03-06 DIAGNOSIS — K219 Gastro-esophageal reflux disease without esophagitis: Secondary | ICD-10-CM | POA: Diagnosis not present

## 2012-03-14 DIAGNOSIS — R0602 Shortness of breath: Secondary | ICD-10-CM | POA: Diagnosis not present

## 2012-03-14 DIAGNOSIS — N949 Unspecified condition associated with female genital organs and menstrual cycle: Secondary | ICD-10-CM | POA: Diagnosis not present

## 2012-03-14 DIAGNOSIS — N644 Mastodynia: Secondary | ICD-10-CM | POA: Diagnosis not present

## 2012-03-14 DIAGNOSIS — I10 Essential (primary) hypertension: Secondary | ICD-10-CM | POA: Diagnosis not present

## 2012-03-16 ENCOUNTER — Institutional Professional Consult (permissible substitution): Payer: Medicare Other | Admitting: Pulmonary Disease

## 2012-03-19 ENCOUNTER — Other Ambulatory Visit: Payer: Self-pay | Admitting: Gastroenterology

## 2012-03-19 DIAGNOSIS — R110 Nausea: Secondary | ICD-10-CM | POA: Diagnosis not present

## 2012-03-19 DIAGNOSIS — K589 Irritable bowel syndrome without diarrhea: Secondary | ICD-10-CM | POA: Diagnosis not present

## 2012-03-19 DIAGNOSIS — K219 Gastro-esophageal reflux disease without esophagitis: Secondary | ICD-10-CM | POA: Diagnosis not present

## 2012-03-19 DIAGNOSIS — K3189 Other diseases of stomach and duodenum: Secondary | ICD-10-CM | POA: Diagnosis not present

## 2012-03-21 ENCOUNTER — Ambulatory Visit
Admission: RE | Admit: 2012-03-21 | Discharge: 2012-03-21 | Disposition: A | Discharge status: Home / Self Care | Payer: Medicare Other | Source: Ambulatory Visit | Attending: Gastroenterology | Admitting: Gastroenterology

## 2012-03-21 DIAGNOSIS — R110 Nausea: Secondary | ICD-10-CM

## 2012-03-21 DIAGNOSIS — K589 Irritable bowel syndrome without diarrhea: Secondary | ICD-10-CM

## 2012-04-02 ENCOUNTER — Ambulatory Visit (HOSPITAL_COMMUNITY)
Admission: RE | Admit: 2012-04-02 | Discharge: 2012-04-02 | Disposition: A | Discharge status: Home / Self Care | Payer: Medicare Other | Source: Ambulatory Visit | Attending: Obstetrics and Gynecology | Admitting: Obstetrics and Gynecology

## 2012-04-02 DIAGNOSIS — Z1231 Encounter for screening mammogram for malignant neoplasm of breast: Secondary | ICD-10-CM

## 2012-04-17 ENCOUNTER — Institutional Professional Consult (permissible substitution): Payer: Medicare Other | Admitting: Pulmonary Disease

## 2012-04-26 DIAGNOSIS — K219 Gastro-esophageal reflux disease without esophagitis: Secondary | ICD-10-CM | POA: Diagnosis not present

## 2012-04-26 DIAGNOSIS — R195 Other fecal abnormalities: Secondary | ICD-10-CM | POA: Diagnosis not present

## 2012-04-26 DIAGNOSIS — R198 Other specified symptoms and signs involving the digestive system and abdomen: Secondary | ICD-10-CM | POA: Diagnosis not present

## 2012-04-26 DIAGNOSIS — R110 Nausea: Secondary | ICD-10-CM | POA: Diagnosis not present

## 2012-04-26 DIAGNOSIS — K589 Irritable bowel syndrome without diarrhea: Secondary | ICD-10-CM | POA: Diagnosis not present

## 2012-05-17 ENCOUNTER — Encounter: Payer: Self-pay | Admitting: Neurology

## 2012-05-17 ENCOUNTER — Ambulatory Visit (INDEPENDENT_AMBULATORY_CARE_PROVIDER_SITE_OTHER): Payer: Medicare Other | Admitting: Neurology

## 2012-05-17 VITALS — BP 121/81 | HR 86 | Temp 97.6°F | Ht 64.0 in | Wt 179.0 lb

## 2012-05-17 DIAGNOSIS — R413 Other amnesia: Secondary | ICD-10-CM | POA: Diagnosis not present

## 2012-05-17 DIAGNOSIS — F329 Major depressive disorder, single episode, unspecified: Secondary | ICD-10-CM

## 2012-05-17 DIAGNOSIS — G250 Essential tremor: Secondary | ICD-10-CM | POA: Diagnosis not present

## 2012-05-17 DIAGNOSIS — F32A Depression, unspecified: Secondary | ICD-10-CM

## 2012-05-17 DIAGNOSIS — M797 Fibromyalgia: Secondary | ICD-10-CM

## 2012-05-17 DIAGNOSIS — F411 Generalized anxiety disorder: Secondary | ICD-10-CM | POA: Diagnosis not present

## 2012-05-17 DIAGNOSIS — IMO0001 Reserved for inherently not codable concepts without codable children: Secondary | ICD-10-CM

## 2012-05-17 HISTORY — DX: Essential tremor: G25.0

## 2012-05-17 HISTORY — DX: Other amnesia: R41.3

## 2012-05-17 NOTE — Progress Notes (Signed)
Subjective:    Patient ID: [name] is a 72 y.o. female.  HPI Interim history:  Ms. Threat is a very pleasant 72 year old right-handed woman who presents for followup consultation of her memory loss and tremors. She is accompanied by today. This is her first visit with me and she is a former patient of Dr. Imagene Gurney, and had seen him for a few years. She has an underlying medical history of high blood pressure, hyperlipidemia, migraine headaches, fibromyalgia, depression and anxiety. She was last seen by Dr. Sandria Manly on 02/01/2012 at which time he tried her on methazolamide for her tremor. He did some blood work and also wanted her to have a DaT scan of the brain.   I reviewed Dr. Imagene Gurney prior notes and the patient's records and below is a summary of that review:  72 year old RH WF with c/o memory loss since L THR in June 2009. She problems with missing appointments and deficiencies at work reported by her coworkers and her superior. She has difficulty driving and difficulty in paying her bills. She has been under stress related to her husband's prescription drug use and has a son who has a Hx of addiction, and may have a bipolar illness. She took care of her mother who lived to be 51 and died in 2022-07-15.  MRI brain 12-12-08 showed a few scattered small T2 signal changes but no other definite abnormalities. MRI brain with contrast on 03/22/2009 showed no significant abnormalities. Neuropsychological evaluation 1/20, 2/28, and 04/28/09 related a different onset of her history of memory difficulties to 2008, when she was having difficulty with work stress and chronic hip pain. Neurocognitive testing was predominantly within normal expectations and it was felt that she was very depressed currently and on a chronic basis. Her ongoing family discord and current  psychological stress were ultimately her difficulties and not cerebral dysfunction. She cannot remember where she has placed objects and has word finding  difficulties. She is being followed by Johnella Moloney, Areta Haber, and Dr. Evelene Croon. She was on a diet and lost 23 pounds, but regained wt. Her cholesterol decreased 50 points. She walks for exercise. She complains of fatigue. A paternal aunt had ALS. Two first cousins have Alzheimer's disease. An aunt and had brain cancer. She had surgery for bilateral ptosis by Dr. Deliah Goody. She has a history of chronic suicidal ideation. She has seen Dr. Marjie Skiff for possible ECT, but was turned down because of her history of memory loss. She went to [facility] meeting with a friend and had difficulty remembering another friend's name. Her husband has left her twice and returned each time. She has tried Seroquel, but discontinued it because she thought affected her memory. On 10/25/2010 = MMSE 30/30, CDT 4/4, falls assessment tool score 10, Geriatric depression scale 15/15. Her mother and maternal grandfather had tremor. She is concerned about twitching of her muscles early in the mornings and better later in the day. She is concerned about Parkinson's disease. She is independent in her ADLs. She fell on 12/12/2011 and was evaluated in the emergency room with negative CT scan of the brain and cervical spine with DJD at C5-6 and C4-5.   Her Past Medical History Is Significant For: Past Medical History  Diagnosis Date  . Fibromyalgia   . Migraines   . Hypertension   . Hyperlipidemia   . GERD (gastroesophageal reflux disease)   . Asthma   . Depression   . Diverticulosis   . Occasional tremors   .  Chronic kidney disease     CKD Stage 3    Her Past Surgical History Is Significant For: Past Surgical History  Procedure Laterality Date  . Total hip arthroplasty      bilateral  . Appendectomy    . Tonsillectomy    . Abdominal hysterectomy    . Adenoidectomy      Her Family History Is Significant For: Family History  Problem Relation Age of Onset  . Stroke Maternal Grandmother   . Heart failure Maternal  Grandfather   . Coronary artery disease Paternal Grandmother   . Coronary artery disease Paternal Grandfather     Her Social History Is Significant For: History   Social History  . Marital Status: Married    Spouse Name: [name]    Number of Children: [name]  . Years of Education: [name]   Occupational History  . Retired    Social History Main Topics  . Smoking status: Never Smoker   . Smokeless tobacco: Never Used  . Alcohol Use: No  . Drug Use: No  . Sexually Active: No   Other Topics Concern  . Not on file   Social History Narrative  . No narrative on file    Her Allergies Are:  Allergies  Allergen Reactions  . Ciprofloxacin Nausea Only  . Erythromycin Other (See Comments)    Stomach burning  . Sulfa Antibiotics Nausea Only  . Latex Rash  . Shellfish Allergy Rash  :   Her Current Medications Are:  Outpatient Encounter Prescriptions as of 05/17/2012  Medication Sig Dispense Refill  . Calcium Carbonate-Vitamin D (CALTRATE 600+D PO) Take 1 tablet by mouth daily.      Marland Kitchen CINNAMON PO Take 1 tablet by mouth daily.      . DULoxetine (CYMBALTA) 60 MG capsule Take 60 mg by mouth daily.      Marland Kitchen FLUoxetine (PROZAC) 40 MG capsule Take 40 mg by mouth daily.      Marland Kitchen HYDROcodone-acetaminophen (NORCO) 7.5-325 MG per tablet Take 1 tablet by mouth every 6 (six) hours as needed. For pain      . LORazepam (ATIVAN) 0.5 MG tablet Take 0.5 mg by mouth at bedtime.      Marland Kitchen losartan (COZAAR) 100 MG tablet Take 100 mg by mouth daily.      . methazolamide (NEPTAZANE) 25 MG tablet Take 25 mg by mouth daily.      Marland Kitchen omeprazole (PRILOSEC) 40 MG capsule Take 40 mg by mouth 2 (two) times daily.      . pregabalin (LYRICA) 50 MG capsule Take 50 mg by mouth 2 (two) times daily.      . SUMAtriptan (IMITREX) 50 MG tablet Take 50 mg by mouth every 2 (two) hours as needed. For migraine      . Multiple Vitamins-Minerals (ICAPS PO) Take 1 tablet by mouth daily.       No facility-administered encounter medications  on file as of 05/17/2012.  :  Review of Systems  Constitutional: Positive for fatigue.  HENT: Positive for tinnitus.   Eyes: Positive for visual disturbance (blrred vision).  Respiratory: Positive for shortness of breath.   Cardiovascular: Positive for leg swelling.  Gastrointestinal: Positive for constipation.  Endocrine: Positive for heat intolerance (flushing) and polydipsia.  Genitourinary: Positive for difficulty urinating.       Incontinence  Musculoskeletal: Positive for myalgias and arthralgias.  Skin: Positive for rash.       Itching, easy bruising, easy bleeding, sensitivity  Neurological: Positive for dizziness and  tremors.       Memory loss  Psychiatric/Behavioral: Positive for suicidal ideas, confusion and dysphoric mood. The patient is nervous/anxious.     Objective:  Neurologic Exam  Physical Exam Physical Examination:   Filed Vitals:   05/17/12 1041  BP: 121/81  Pulse: 86  Temp: 97.6 F (36.4 C)    General Examination: The patient is a very pleasant 72 y.o. female in no acute distress. She is calm and cooperative with the exam. She denies Auditory Hallucinations and Visual Hallucinations.   HEENT: Normocephalic, atraumatic, pupils are equal, round and reactive to light and accommodation. Funduscopic exam is normal with sharp disc margins noted. Extraocular tracking shows mild saccadic breakdown without nystagmus noted. Hearing is intact. Face is symmetric with no facial masking and normal facial sensation. There is no lip, neck or jaw tremor. Neck is not rigid with intact passive ROM. There are no carotid bruits on auscultation. Oropharynx exam reveals mild mouth dryness. No significant airway crowding is noted. Mallampati is class II. Tongue protrudes centrally and palate elevates symmetrically.    Chest: is clear to auscultation without wheezing, rhonchi or crackles noted.  Heart: sounds are regular and normal without murmurs, rubs or gallops noted.    Abdomen: is soft, non-tender and non-distended with normal bowel sounds appreciated on auscultation.  Extremities: There is no pitting edema in the distal lower extremities bilaterally. Pedal pulses are intact.  Skin: is warm and dry with no trophic changes noted.  Musculoskeletal: exam reveals no obvious joint deformities, tenderness or joint swelling or erythema.  Neurologically:  Mental status: The patient is awake and alert, paying good  attention. She is able to to provide a good history. She is oriented to: person, place, time/date, situation, day of week, month of year and year. Her memory, attention, language and knowledge are intact. There is no aphasia, agnosia, apraxia or anomia. There is a no bradyphrenia. Speech is not hypophonic with no dysarthria noted. Mood is mildly depressed and affect is  anxious and dysthymic.   Cranial nerves are as described above under HEENT exam. In addition, shoulder shrug is normal with equal shoulder height noted.  Motor exam: Normal bulk, and strength for age is noted. Tone is not rigid with absence of cogwheeling in the extremities. There is overall no bradykinesia. There is no drift or rebound. There is a mild to moderate bilateral upper extremity postural and action tremor, there is a intermittent milder head tremor as well. There is no resting tremor. Romberg is negative. Reflexes are 1+ in the upper extremities and 1+ in the lower extremities. Fine motor skills: Finger taps, hand movements, and rapid alternating patting are not impaired bilaterally. Foot taps and foot agility are not impaired bilaterally.   Cerebellar testing shows no dysmetria or intention tremor on finger to nose testing. Heel to shin is unremarkable. There is no truncal or gait ataxia.   Sensory exam is intact to light touch.   Gait, station and balance: She stands up from the seated position with no difficulty. No veering to one side is noted. No leaning to one side.  Posture is mildly stooped. Stance is wide-based. She turns in 3 steps. Tandem walk is not possible. Balance is mildly impaired.   She is able to pull herself up on her toes and heels.   Assessment and Plan:    Assessment and Plan:  In summary, LILLYONA POLASEK is a very pleasant 72 y.o.-year old female with a history of depression, anxiety,  memory loss and essential tremor.  She is Is fairly stable is worse her memory and her tremors concerned that she does have residual depression and anxiety and we spent a long time talking about her mood and her stressors and most of my 40 minute visit was spent in counseling and coordination of care. I would like to reassess her cognitive function with formal and neuropsychological evaluation and I made a referral for that. She may indeed be a candidate for ECT down the Road and I would like to see if we can go that route and reevaluate her for that. I know she was turned down in the past for this. She feels that her mood medicines make her lethargic and in fact she did not take her Prozac this morning are Lyrica. She will need to reschedule an appointment with her psychiatrist and I will copy from my note today. I wonder, if her antidepressants may be consolidated such as perhaps increasing the Cymbalta and phasing out the Prozac. I will leave this up to her psychiatrist to discuss with her. I did not make any changes to her medications today. We talked about maintaining a healthy lifestyle in general. I encouraged the patient to eat healthy, exercise daily and keep well hydrated, to keep a scheduled bedtime and wake time routine, to not skip any meals and eat healthy snacks in between meals and to have protein with every meal.   I answered all her questions today and the patient was in agreement with the plan. I would like to see her back in 6 months, sooner if the need arises and encouraged her to call with any interim questions, concerns, problems or updates and  refill requests.

## 2012-05-17 NOTE — Patient Instructions (Addendum)
I would like to send you for formal memory testing. Perhaps you may be a candidate for ECT down the road. I will communicate with your psychiatrist, Dr. Evelene Croon.   I do have some generic suggestions for you today:    Please make sure that you drink plenty of fluids. I would like for you to exercise daily for example in the form of walking 20-30 minutes every day, if you can. Please keep a regular sleep-wake schedule, keep regular meal times, do not skip any meals, eat  healthy snacks in between meals, such as fruit or nuts. Try to eat protein with every meal.   Engage in social activities in your community and with your family and try to keep up with current events by reading the newspaper or watching the news.  I do not think we need to make any changes in your medications at this point. I think you're stable enough that I can see you back in 6 months, sooner if we need to. Please call us if you have any interim questions, concerns, or problems or updates to need to discuss.  Please also call us for any test results so we can go over those with you on the phone. Brett Canales is my clinical assistant and will answer any of your questions and relay your messages to me and will give you my messages.   Our phone number is [phone]. We also have an after hours call service for urgent matters and there is a physician on-call for urgent questions. For any emergencies you know to call 911 or go to the nearest emergency room.

## 2012-05-24 ENCOUNTER — Ambulatory Visit (INDEPENDENT_AMBULATORY_CARE_PROVIDER_SITE_OTHER): Payer: Medicare Other | Admitting: Pulmonary Disease

## 2012-05-24 ENCOUNTER — Encounter: Payer: Self-pay | Admitting: Pulmonary Disease

## 2012-05-24 ENCOUNTER — Other Ambulatory Visit: Payer: Medicare Other

## 2012-05-24 VITALS — BP 110/80 | HR 82 | Temp 97.7°F | Ht 64.0 in | Wt 177.6 lb

## 2012-05-24 DIAGNOSIS — R0609 Other forms of dyspnea: Secondary | ICD-10-CM

## 2012-05-24 DIAGNOSIS — R0600 Dyspnea, unspecified: Secondary | ICD-10-CM

## 2012-05-24 DIAGNOSIS — R0989 Other specified symptoms and signs involving the circulatory and respiratory systems: Secondary | ICD-10-CM | POA: Diagnosis not present

## 2012-05-24 NOTE — Progress Notes (Signed)
  Subjective:    Patient ID: [name], female    DOB: 06/11/1940, 72 y.o.   MRN: [id number]  HPI    Review of Systems  Constitutional: Negative for appetite change and unexpected weight change.  HENT: Negative for ear pain, congestion, sore throat, sneezing, trouble swallowing and dental problem.   Respiratory: Positive for cough and shortness of breath.   Cardiovascular: Positive for chest pain. Negative for palpitations and leg swelling.  Gastrointestinal: Negative for abdominal pain.  Musculoskeletal: Negative for joint swelling.  Skin: Negative for rash.  Neurological: Negative for headaches.  Psychiatric/Behavioral: Positive for dysphoric mood. The patient is nervous/anxious.        Objective:   Physical Exam        Assessment & Plan:

## 2012-05-24 NOTE — Progress Notes (Signed)
Subjective:    Patient ID: [name], female    DOB: 04-Apr-1940, 72 y.o.   MRN: [id number]  HPI 72 years old never smoker with multiple medical problems including movement disorder,  hypertension dyslipidemia , asthma,  depression and fibromyalgia presents for evaluation of dyspnea with and without exertion She reports dyspnea while standing or walking and worse when doing things. This is gradually getting worse for 2 years. She is particularly concerned about her family history of pulmonary embolism in her mother and lung cancer in a maternal aunt. Denies wheezing, orthopnea, paroxysmal nocturnal dyspnea or pedal edema. She had an emergency room visit on 03/03/2012. Her cardiac markers and troponins were negative chest x-ray was negative EKG was unremarkable .She was seen by the cardiologist & underwent nuclear stress test which was negative for any ischemia.  It was felt that her atypical chest pain may have been due to GERD or anxiety CT chest in 07/2007 was performed due to abnormal chest x-ray, which showed No acute process in the chest. No lung nodule. The plain film abnormality was felt to be artifactual due to summation shadow.  Past Medical History  Diagnosis Date  . Fibromyalgia   . Migraines   . Hypertension   . Hyperlipidemia   . GERD (gastroesophageal reflux disease)   . Asthma   . Depression   . Diverticulosis   . Occasional tremors   . Chronic kidney disease     CKD Stage 3  . Essential tremor 05/17/2012  . Memory loss 05/17/2012    Past Surgical History  Procedure Laterality Date  . Total hip arthroplasty      bilateral  . Appendectomy    . Tonsillectomy    . Abdominal hysterectomy    . Adenoidectomy    . Cataract extraction      Allergies  Allergen Reactions  . Ciprofloxacin Nausea Only  . Erythromycin Other (See Comments)    Stomach burning  . Sulfa Antibiotics Nausea Only  . Latex Rash  . Shellfish Allergy Rash    History   Social History  .  Marital Status: Married    Spouse Name: [name]    Number of Children: [name]  . Years of Education: [name]   Occupational History  . Retired    Social History Main Topics  . Smoking status: Never Smoker   . Smokeless tobacco: Never Used  . Alcohol Use: No  . Drug Use: No  . Sexually Active: No   Other Topics Concern  . Not on file   Social History Narrative  . No narrative on file    Family History  Problem Relation Age of Onset  . Stroke Maternal Grandmother   . Heart failure Maternal Grandfather   . Coronary artery disease Paternal Grandmother   . Coronary artery disease Paternal Grandfather   . Liver cancer Mother   . Prostate cancer Father   . Prostate cancer Paternal Uncle   . ALS Paternal Aunt   . Brain cancer Paternal Aunt   . Breast cancer Paternal Aunt   . Lung cancer Maternal Aunt   . Emphysema Maternal Grandfather   . Congestive Heart Failure Maternal Grandfather   . Pulmonary embolism Mother   . Depression Brother   . Diabetes      father side      Review of Systems neg for any significant sore throat, dysphagia, itching, sneezing, nasal congestion or excess/ purulent secretions, fever, chills, sweats, unintended wt loss, pleuritic or exertional cp, hempoptysis,  orthopnea pnd or change in chronic leg swelling. Also denies presyncope, palpitations, heartburn, abdominal pain, nausea, vomiting, diarrhea or change in bowel or urinary habits, dysuria,hematuria, rash, arthralgias, visual complaints, headache, numbness weakness or ataxia.     Objective:   Physical Exam  Gen. Pleasant, well-nourished, in no distress, normal affect ENT - no lesions, no post nasal drip Neck: No JVD, no thyromegaly, no carotid bruits Lungs: no use of accessory muscles, no dullness to percussion, clear without rales or rhonchi  Cardiovascular: Rhythm regular, heart sounds  normal, no murmurs or gallops, no peripheral edema Abdomen: soft and non-tender, no hepatosplenomegaly, BS  normal. Musculoskeletal: No deformities, no cyanosis or clubbing Neuro:  alert, non focal       Assessment & Plan:

## 2012-05-24 NOTE — Assessment & Plan Note (Addendum)
The cause of her dyspnea is unclear to me .she had a negative cardiac workup recently.Her exam and imaging does not suggest evidence of interstitial lung disease.  Since she is concerned about venous thromboembolic, I think it is reasonable to check a d-dimer today. If this is negative we did not have to pursue imaging. She does not desaturate on ambulation and that is reassuring. I am not sure if her dyspnea is related to her neurological issues. Will obtain full pulmonary function testing including measurement of inspiratory and expiratory pressures. If the above workup not liters in any direction and we may have to address her anxiety and depression issues.

## 2012-05-24 NOTE — Patient Instructions (Addendum)
Blood work today for blood clot Ambulatory satn Breathing test We will call you with results - If all tests negative, we can try to treat your anxiety

## 2012-05-25 ENCOUNTER — Telehealth: Payer: Self-pay | Admitting: Pulmonary Disease

## 2012-05-25 DIAGNOSIS — R0600 Dyspnea, unspecified: Secondary | ICD-10-CM

## 2012-05-25 LAB — D-DIMER, QUANTITATIVE (NOT AT ARMC): D-Dimer, Quant: 0.51 ug/mL-FEU — ABNORMAL HIGH (ref 0.00–0.48)

## 2012-05-25 NOTE — Telephone Encounter (Signed)
ATC pt- na wcb 

## 2012-05-25 NOTE — Telephone Encounter (Signed)
D-dimer borderline positive Proceed with CT angiogram to r/o blood clots (low prob) OK to chk BMET prior if needed

## 2012-05-29 ENCOUNTER — Other Ambulatory Visit: Payer: Self-pay | Admitting: Pulmonary Disease

## 2012-05-29 ENCOUNTER — Ambulatory Visit (INDEPENDENT_AMBULATORY_CARE_PROVIDER_SITE_OTHER)
Admission: RE | Admit: 2012-05-29 | Discharge: 2012-05-29 | Disposition: A | Discharge status: Home / Self Care | Payer: Medicare Other | Source: Ambulatory Visit | Attending: Pulmonary Disease | Admitting: Pulmonary Disease

## 2012-05-29 ENCOUNTER — Other Ambulatory Visit (INDEPENDENT_AMBULATORY_CARE_PROVIDER_SITE_OTHER): Payer: Medicare Other

## 2012-05-29 DIAGNOSIS — R0989 Other specified symptoms and signs involving the circulatory and respiratory systems: Secondary | ICD-10-CM

## 2012-05-29 DIAGNOSIS — F329 Major depressive disorder, single episode, unspecified: Secondary | ICD-10-CM | POA: Diagnosis not present

## 2012-05-29 DIAGNOSIS — R0609 Other forms of dyspnea: Secondary | ICD-10-CM

## 2012-05-29 DIAGNOSIS — K219 Gastro-esophageal reflux disease without esophagitis: Secondary | ICD-10-CM | POA: Diagnosis not present

## 2012-05-29 DIAGNOSIS — R0600 Dyspnea, unspecified: Secondary | ICD-10-CM

## 2012-05-29 DIAGNOSIS — IMO0001 Reserved for inherently not codable concepts without codable children: Secondary | ICD-10-CM | POA: Diagnosis not present

## 2012-05-29 DIAGNOSIS — I10 Essential (primary) hypertension: Secondary | ICD-10-CM

## 2012-05-29 DIAGNOSIS — N183 Chronic kidney disease, stage 3 unspecified: Secondary | ICD-10-CM | POA: Diagnosis not present

## 2012-05-29 DIAGNOSIS — J9819 Other pulmonary collapse: Secondary | ICD-10-CM | POA: Diagnosis not present

## 2012-05-29 LAB — BASIC METABOLIC PANEL
BUN: 14 mg/dL (ref 6–23)
CO2: 27 mEq/L (ref 19–32)
Calcium: 9.1 mg/dL (ref 8.4–10.5)
Chloride: 102 mEq/L (ref 96–112)
Creatinine, Ser: 0.8 mg/dL (ref 0.4–1.2)
GFR: 74.34 mL/min (ref >60.00)
Glucose, Bld: 87 mg/dL (ref 70–99)
Potassium: 3.6 mEq/L (ref 3.5–5.1)
Sodium: 135 mEq/L (ref 135–145)

## 2012-05-29 MED ORDER — IOHEXOL 350 MG/ML SOLN
100.0000 mL | Freq: Once | INTRAVENOUS | Status: AC | PRN
  Administered 2012-05-29: 100 mL via INTRAVENOUS

## 2012-05-29 NOTE — Telephone Encounter (Signed)
Please have her test scheduled at [facility] where they can premedicate with solumedrol & benadryl for test

## 2012-05-29 NOTE — Telephone Encounter (Signed)
Pt has documented shellfish allergy with "rash" as the reaction Pt stated that she was told previously by an allergist that she is allergic to shellfish and to avoid this food Pt may need to be premedicated [facility] CT does not pre-medicate; will need to be ordered by RA LB CT can do test this afternoon at 3:30pm Pt will need to come by for STAT bmet prior to her 2:30pm appt with Dr Eula Listen  Dr Vassie Loll, please advise on how you would like to proceed Routed high priority  *bmet changed to STAT in epic *call patient on her cell # at [phone]

## 2012-05-29 NOTE — Telephone Encounter (Signed)
I spoke with [name]. He stated there is no protocol since it is shellfish with the dye they use now. Will forward to RA so he is aware

## 2012-05-29 NOTE — Addendum Note (Signed)
Addended by: Boone Master E on: 05/29/2012 01:19 PM   Modules accepted: Orders

## 2012-05-29 NOTE — Telephone Encounter (Signed)
Called spoke with patient, advised of d-dimer results / recs as stated by RA Pt stated that she did receive a phone call from our lab stating that her bmet was not done Pt has upcoming appt with Dr Eula Listen this afternoon and will come for the bmet while she is out The CT PE protocol was ordered by RA on 4.11.14 but has yet to be scheduled ??? Unsure why  Replaced order for CT Angio Pt is aware she will be contacted about scheduling this and that we will contact her once the results are available Will sign off

## 2012-05-29 NOTE — Telephone Encounter (Signed)
Please advise advise PCC's thanks

## 2012-05-29 NOTE — Telephone Encounter (Signed)
Pl find out if [facility] has a protocol for people with shellfish allergy This is not an emergent test

## 2012-06-26 ENCOUNTER — Encounter: Payer: Self-pay | Admitting: Adult Health

## 2012-06-26 ENCOUNTER — Ambulatory Visit (INDEPENDENT_AMBULATORY_CARE_PROVIDER_SITE_OTHER): Payer: Medicare Other | Admitting: Adult Health

## 2012-06-26 ENCOUNTER — Ambulatory Visit (INDEPENDENT_AMBULATORY_CARE_PROVIDER_SITE_OTHER): Payer: Medicare Other | Admitting: Pulmonary Disease

## 2012-06-26 VITALS — BP 160/82 | HR 88 | Temp 97.9°F | Ht 63.0 in | Wt 176.0 lb

## 2012-06-26 DIAGNOSIS — R0609 Other forms of dyspnea: Secondary | ICD-10-CM

## 2012-06-26 DIAGNOSIS — R0600 Dyspnea, unspecified: Secondary | ICD-10-CM

## 2012-06-26 DIAGNOSIS — R0989 Other specified symptoms and signs involving the circulatory and respiratory systems: Secondary | ICD-10-CM

## 2012-06-26 DIAGNOSIS — J45909 Unspecified asthma, uncomplicated: Secondary | ICD-10-CM

## 2012-06-26 LAB — PULMONARY FUNCTION TEST

## 2012-06-26 MED ORDER — FLUNISOLIDE HFA 80 MCG/ACT IN AERS: 2.0000 | INHALATION_SPRAY | Freq: Two times a day (BID) | RESPIRATORY_TRACT | Status: DC

## 2012-06-26 NOTE — Progress Notes (Signed)
PFT done today. 

## 2012-06-26 NOTE — Patient Instructions (Addendum)
Begin AeroSpan 2 puffs Twice daily  -brush/rinse/gargle after use.  follow up Dr. Vassie Loll  In 4 weeks and As needed

## 2012-06-27 MED ORDER — FLUNISOLIDE HFA 80 MCG/ACT IN AERS: 2.0000 | INHALATION_SPRAY | Freq: Two times a day (BID) | RESPIRATORY_TRACT | Status: DC

## 2012-06-28 NOTE — Progress Notes (Signed)
Subjective:    Patient ID: [name], female    DOB: 05/03/1940, 72 y.o.   MRN: [id number]  HPI  72 years old never smoker with multiple medical problems including movement disorder,  hypertension dyslipidemia , asthma,  depression and fibromyalgia presents for evaluation of dyspnea with and without exertion Initial pulmonary consult 05/24/12   05/24/12 IOV  She reports dyspnea while standing or walking and worse when doing things. This is gradually getting worse for 2 years. She is particularly concerned about her family history of pulmonary embolism in her mother and lung cancer in a maternal aunt. Denies wheezing, orthopnea, paroxysmal nocturnal dyspnea or pedal edema. She had an emergency room visit on 03/03/2012. Her cardiac markers and troponins were negative chest x-ray was negative EKG was unremarkable .She was seen by the cardiologist & underwent nuclear stress test which was negative for any ischemia.  It was felt that her atypical chest pain may have been due to GERD or anxiety CT chest in 07/2007 was performed due to abnormal chest x-ray, which showed No acute process in the chest. No lung nodule. The plain film abnormality was felt to be artifactual due to summation shadow. >CT chest neg for PE, dependent  atx, no ILD changes. Shoulder DJD   06/26/12 Follow up w/ PFT  4 week follow up w/ PFT .  Seen 1 month ago for initial pulmonary consult for Dyspnea.  Has FH of PE. D Dimer was mild elevated -subsequent CT chest showed -neg for PE, dependent  atx, no ILD changes. Shoulder DJD  Since last ov dyspnea is slightly improved since last ov.   Does have intermittent  SOB, chest tightness, dry cough this morning.  No discolored mucus, wt loss, hemoptysis , edema or orthopnea. No exertional chest pain.  PFT today showed Nml FEV1 at 114% , min drop in ratio at 63,  Mild obstruction in mid flows at 58% w/  20% BD response.   Past Medical History  Diagnosis Date  . Fibromyalgia   .  Migraines   . Hypertension   . Hyperlipidemia   . GERD (gastroesophageal reflux disease)   . Asthma   . Depression   . Diverticulosis   . Occasional tremors   . Chronic kidney disease     CKD Stage 3  . Essential tremor 05/17/2012  . Memory loss 05/17/2012    Past Surgical History  Procedure Laterality Date  . Total hip arthroplasty      bilateral  . Appendectomy    . Tonsillectomy    . Abdominal hysterectomy    . Adenoidectomy    . Cataract extraction      Allergies  Allergen Reactions  . Ciprofloxacin Nausea Only  . Erythromycin Other (See Comments)    Stomach burning  . Sulfa Antibiotics Nausea Only  . Latex Rash  . Shellfish Allergy Rash    History   Social History  . Marital Status: Married    Spouse Name: [name]    Number of Children: [name]  . Years of Education: [name]   Occupational History  . Retired    Social History Main Topics  . Smoking status: Never Smoker   . Smokeless tobacco: Never Used  . Alcohol Use: No  . Drug Use: No  . Sexually Active: No   Other Topics Concern  . Not on file   Social History Narrative  . No narrative on file    Family History  Problem Relation Age of Onset  .  Stroke Maternal Grandmother   . Heart failure Maternal Grandfather   . Coronary artery disease Paternal Grandmother   . Coronary artery disease Paternal Grandfather   . Liver cancer Mother   . Prostate cancer Father   . Prostate cancer Paternal Uncle   . ALS Paternal Aunt   . Brain cancer Paternal Aunt   . Breast cancer Paternal Aunt   . Lung cancer Maternal Aunt   . Emphysema Maternal Grandfather   . Congestive Heart Failure Maternal Grandfather   . Pulmonary embolism Mother   . Depression Brother   . Diabetes      father side      Review of Systems  neg for any significant sore throat, dysphagia, itching, sneezing, nasal congestion or excess/ purulent secretions, fever, chills, sweats, unintended wt loss, pleuritic or exertional cp, hempoptysis,  orthopnea pnd or change in chronic leg swelling. Also denies presyncope, palpitations, heartburn, abdominal pain, nausea, vomiting, diarrhea or change in bowel or urinary habits, dysuria,hematuria, rash, arthralgias, visual complaints, headache, numbness weakness or ataxia.     Objective:   Physical Exam   Gen. Pleasant, well-nourished, in no distress, normal affect ENT - no lesions, no post nasal drip Neck: No JVD, no thyromegaly, no carotid bruits Lungs: no use of accessory muscles, no dullness to percussion, clear without rales or rhonchi  Cardiovascular: Rhythm regular, heart sounds  normal, no murmurs or gallops, no peripheral edema Abdomen: soft and non-tender, no hepatosplenomegaly, BS normal. Musculoskeletal: No deformities, no cyanosis or clubbing Neuro:  alert, non focal, bilateral hand tremor        Assessment & Plan:

## 2012-06-28 NOTE — Assessment & Plan Note (Addendum)
?   Asthma component w/ +BD response on PFT (mid flows)  Feel probable minimal asthma Trial of ICS (avoid LABA if possible d/t sig tremor) , chose aerochamber devise due to tremor/coordination issues )  Pt education and device teaching done  Plan  Begin AeroSpan 2 puffs Twice daily  -brush/rinse/gargle after use.  follow up Dr. Vassie Loll  In 4 weeks and As needed      .

## 2012-07-23 ENCOUNTER — Encounter: Payer: Self-pay | Admitting: Pulmonary Disease

## 2012-07-23 ENCOUNTER — Ambulatory Visit (INDEPENDENT_AMBULATORY_CARE_PROVIDER_SITE_OTHER): Payer: Medicare Other | Admitting: Pulmonary Disease

## 2012-07-23 VITALS — BP 122/74 | HR 84 | Temp 98.0°F | Ht 63.0 in | Wt 178.8 lb

## 2012-07-23 DIAGNOSIS — J4530 Mild persistent asthma, uncomplicated: Secondary | ICD-10-CM

## 2012-07-23 DIAGNOSIS — J45909 Unspecified asthma, uncomplicated: Secondary | ICD-10-CM | POA: Diagnosis not present

## 2012-07-23 NOTE — Progress Notes (Signed)
  Subjective:    Patient ID: [name], female    DOB: 1940-03-31, 72 y.o.   MRN: [id number]  HPI 72 years old never smoker with multiple medical problems including movement disorder, hypertension dyslipidemia , asthma, depression and fibromyalgia presents for evaluation of dyspnea with and without exertion  Initial pulmonary consult 05/24/12  She reports dyspnea while standing or walking and worse when doing things. This is gradually getting worse for 2 years. She is particularly concerned about her family history of pulmonary embolism in her mother and lung cancer in a maternal aunt. Denies wheezing, orthopnea, paroxysmal nocturnal dyspnea or pedal edema.  She had an emergency room visit on 03/03/2012. Her cardiac markers and troponins were negative chest x-ray was negative EKG was unremarkable .She was seen by the cardiologist & underwent nuclear stress test which was negative for any ischemia.  It was felt that her atypical chest pain may have been due to GERD or anxiety  CT chest in 07/2007 was performed due to abnormal chest x-ray, which showed No acute process in the chest. No lung nodule. The plain film abnormality was felt to be artifactual due to summation shadow.  >CT chest neg for PE, dependent atx, no ILD changes. Shoulder DJD   07/23/2012 PFT 06/26/12 showed Nml FEV1 at 114% , min drop in ratio at 63, Mild obstruction in mid flows at 58% w/ 20% BD response.>> started on inhaled steroids with spacer Pt states her breathing is a lot better now. Pt does not have any cough but does a lot of throat clearing. Pt states she is able to walk around the driveway twice.  No wheezing , nocturnal symptoms  Review of Systems neg for any significant sore throat, dysphagia, itching, sneezing, nasal congestion or excess/ purulent secretions, fever, chills, sweats, unintended wt loss, pleuritic or exertional cp, hempoptysis, orthopnea pnd or change in chronic leg swelling. Also denies presyncope,  palpitations, heartburn, abdominal pain, nausea, vomiting, diarrhea or change in bowel or urinary habits, dysuria,hematuria, rash, arthralgias, visual complaints, headache, numbness weakness or ataxia.     Objective:   Physical Exam  Gen. Pleasant, well-nourished, in no distress ENT - no lesions, no post nasal drip Neck: No JVD, no thyromegaly, no carotid bruits Lungs: no use of accessory muscles, no dullness to percussion, clear without rales or rhonchi  Cardiovascular: Rhythm regular, heart sounds  normal, no murmurs or gallops, no peripheral edema Musculoskeletal: No deformities, no cyanosis or clubbing         Assessment & Plan:

## 2012-07-23 NOTE — Assessment & Plan Note (Signed)
Much improved with Kellie Moor Call us for Rx for Aerospan once you are done with samples Step-down in 8m if remains controlled

## 2012-07-23 NOTE — Patient Instructions (Signed)
Call us for Rx for Aerospan once you are done with samples

## 2012-07-30 DIAGNOSIS — I129 Hypertensive chronic kidney disease with stage 1 through stage 4 chronic kidney disease, or unspecified chronic kidney disease: Secondary | ICD-10-CM | POA: Diagnosis not present

## 2012-07-30 DIAGNOSIS — R413 Other amnesia: Secondary | ICD-10-CM | POA: Diagnosis not present

## 2012-07-30 DIAGNOSIS — N183 Chronic kidney disease, stage 3 unspecified: Secondary | ICD-10-CM | POA: Diagnosis not present

## 2012-08-14 DIAGNOSIS — N390 Urinary tract infection, site not specified: Secondary | ICD-10-CM | POA: Diagnosis not present

## 2012-08-16 DIAGNOSIS — N390 Urinary tract infection, site not specified: Secondary | ICD-10-CM | POA: Diagnosis not present

## 2012-09-11 ENCOUNTER — Telehealth: Payer: Self-pay | Admitting: Pulmonary Disease

## 2012-09-11 MED ORDER — FLUNISOLIDE HFA 80 MCG/ACT IN AERS: 2.0000 | INHALATION_SPRAY | Freq: Two times a day (BID) | RESPIRATORY_TRACT | Status: DC

## 2012-09-11 NOTE — Telephone Encounter (Signed)
Pt is aware that there are samples up front for pick up.

## 2012-09-17 ENCOUNTER — Ambulatory Visit: Payer: Medicare Other | Admitting: Neurology

## 2012-09-19 ENCOUNTER — Telehealth: Payer: Self-pay | Admitting: Neurology

## 2012-09-19 NOTE — Telephone Encounter (Signed)
Transfer of care Dr. Love needs to be reassigned per Dr. Athar °

## 2012-09-19 NOTE — Telephone Encounter (Signed)
Assigned to Dr. Yan. 

## 2012-09-20 ENCOUNTER — Telehealth: Payer: Self-pay | Admitting: Neurology

## 2012-10-01 DIAGNOSIS — F331 Major depressive disorder, recurrent, moderate: Secondary | ICD-10-CM | POA: Diagnosis not present

## 2012-10-16 DIAGNOSIS — R109 Unspecified abdominal pain: Secondary | ICD-10-CM | POA: Diagnosis not present

## 2012-10-16 DIAGNOSIS — Z733 Stress, not elsewhere classified: Secondary | ICD-10-CM | POA: Diagnosis not present

## 2012-10-19 ENCOUNTER — Other Ambulatory Visit: Payer: Self-pay

## 2012-10-19 MED ORDER — METHAZOLAMIDE 25 MG PO TABS: 25.0000 mg | ORAL_TABLET | Freq: Every day | ORAL | Status: DC

## 2012-10-19 NOTE — Telephone Encounter (Signed)
Former Love patient assigned to Dr Terrace Arabia.  Has an appt in Dec.

## 2012-10-22 ENCOUNTER — Telehealth: Payer: Self-pay | Admitting: Pulmonary Disease

## 2012-10-22 MED ORDER — FLUNISOLIDE HFA 80 MCG/ACT IN AERS: 2.0000 | INHALATION_SPRAY | Freq: Two times a day (BID) | RESPIRATORY_TRACT | Status: DC

## 2012-10-22 NOTE — Telephone Encounter (Signed)
I spoke with pt and is aware 2 samples have been left upfront for pick up. Nothing further needed

## 2012-11-19 ENCOUNTER — Ambulatory Visit: Payer: Medicare Other | Admitting: Neurology

## 2012-11-20 DIAGNOSIS — N390 Urinary tract infection, site not specified: Secondary | ICD-10-CM | POA: Diagnosis not present

## 2012-11-26 DIAGNOSIS — I10 Essential (primary) hypertension: Secondary | ICD-10-CM | POA: Diagnosis not present

## 2012-11-26 DIAGNOSIS — IMO0001 Reserved for inherently not codable concepts without codable children: Secondary | ICD-10-CM | POA: Diagnosis not present

## 2012-12-04 ENCOUNTER — Telehealth: Payer: Self-pay | Admitting: Pulmonary Disease

## 2012-12-04 MED ORDER — FLUNISOLIDE HFA 80 MCG/ACT IN AERS: 2.0000 | INHALATION_SPRAY | Freq: Two times a day (BID) | RESPIRATORY_TRACT | Status: DC

## 2012-12-04 NOTE — Telephone Encounter (Signed)
2 boxes of aerospan left up front for pick up

## 2012-12-04 NOTE — Telephone Encounter (Signed)
Pt would like to know if she will be able to p/u a sample tomorrow.  Pt would like a returned call this evening, please.  [name]

## 2012-12-07 DIAGNOSIS — Z23 Encounter for immunization: Secondary | ICD-10-CM | POA: Diagnosis not present

## 2012-12-26 DIAGNOSIS — Z96649 Presence of unspecified artificial hip joint: Secondary | ICD-10-CM | POA: Diagnosis not present

## 2012-12-26 DIAGNOSIS — M169 Osteoarthritis of hip, unspecified: Secondary | ICD-10-CM | POA: Diagnosis not present

## 2013-01-03 DIAGNOSIS — Z961 Presence of intraocular lens: Secondary | ICD-10-CM | POA: Diagnosis not present

## 2013-01-08 ENCOUNTER — Telehealth: Payer: Self-pay | Admitting: *Deleted

## 2013-01-08 NOTE — Telephone Encounter (Signed)
Left message for patient to call to r/s appt

## 2013-01-14 ENCOUNTER — Ambulatory Visit: Payer: Medicare Other | Admitting: Neurology

## 2013-01-15 ENCOUNTER — Telehealth: Payer: Self-pay | Admitting: Neurology

## 2013-01-15 NOTE — Telephone Encounter (Signed)
Is fine with me, please advise Dr. Anne Hahn as well.

## 2013-01-15 NOTE — Telephone Encounter (Signed)
Okay for transfer to my care.

## 2013-01-15 NOTE — Telephone Encounter (Signed)
Patient is requesting to switch from [name] to [name]( had been recommended by [name]) and she would rather have him as her neurologist.

## 2013-01-24 ENCOUNTER — Ambulatory Visit (INDEPENDENT_AMBULATORY_CARE_PROVIDER_SITE_OTHER): Payer: Medicare Other | Admitting: Neurology

## 2013-01-24 ENCOUNTER — Encounter: Payer: Self-pay | Admitting: Neurology

## 2013-01-24 VITALS — BP 140/73 | HR 89 | Wt 187.0 lb

## 2013-01-24 DIAGNOSIS — R413 Other amnesia: Secondary | ICD-10-CM

## 2013-01-24 DIAGNOSIS — M797 Fibromyalgia: Secondary | ICD-10-CM

## 2013-01-24 DIAGNOSIS — G250 Essential tremor: Secondary | ICD-10-CM

## 2013-01-24 DIAGNOSIS — IMO0001 Reserved for inherently not codable concepts without codable children: Secondary | ICD-10-CM

## 2013-01-24 NOTE — Progress Notes (Signed)
Reason for visit: Tremor  [name] is an 72 y.o. female  History of present illness:  [name] is a 72 year old right-handed white female with a history of a benign essential tremor. The patient has severe depression issues, and she has reported some memory loss. The patient has had MRI evaluations in the past showing minimal white matter changes. The patient has been followed over time for her memory. The patient is followed by Dr. Evelene Croon for her depression. The patient has been on various medications for her depression in the past. The patient has had ongoing issues in this regard. The patient also reports a history of migraine headaches that are occurring on average 2 or 3 times a month. Usually Imitrex will help the headache. The patient has developed a vocal tremor as well, and this affects her ability to sing. The tremor affects her handwriting, not her ability to feed herself. The patient has some mild gait instability, with occasional falls. The patient has not had a large change in memory since last seen. The patient is concerned that her memory may worsen if ECT treatments are initiated for her depression.  Past Medical History  Diagnosis Date  . Fibromyalgia   . Migraines   . Hypertension   . Hyperlipidemia   . GERD (gastroesophageal reflux disease)   . Asthma   . Depression   . Diverticulosis   . Occasional tremors   . Chronic kidney disease     CKD Stage 3  . Essential tremor 05/17/2012  . Memory loss 05/17/2012  . Chronic renal insufficiency, stage III (moderate)     Past Surgical History  Procedure Laterality Date  . Total hip arthroplasty      bilateral  . Appendectomy    . Tonsillectomy    . Abdominal hysterectomy    . Adenoidectomy    . Cataract extraction      Family History  Problem Relation Age of Onset  . Stroke Maternal Grandmother   . Heart failure Maternal Grandfather   . Coronary artery disease Paternal Grandmother   . Coronary artery  disease Paternal Grandfather   . Liver cancer Mother   . Prostate cancer Father   . Prostate cancer Paternal Uncle   . ALS Paternal Aunt   . Brain cancer Paternal Aunt   . Breast cancer Paternal Aunt   . Lung cancer Maternal Aunt   . Emphysema Maternal Grandfather   . Congestive Heart Failure Maternal Grandfather   . Pulmonary embolism Mother   . Depression Brother   . Diabetes      father side    Social history:  reports that she has never smoked. She has never used smokeless tobacco. She reports that she does not drink alcohol or use illicit drugs.    Allergies  Allergen Reactions  . Ciprofloxacin Nausea Only  . Erythromycin Other (See Comments)    Stomach burning  . Sulfa Antibiotics Nausea Only  . Latex Rash  . Shellfish Allergy Rash    Medications:  Current Outpatient Prescriptions on File Prior to Visit  Medication Sig Dispense Refill  . Calcium Carbonate-Vitamin D (CALTRATE 600+D PO) Take 1 tablet by mouth daily.      Marland Kitchen CINNAMON PO Take 1 tablet by mouth daily.      . Flunisolide HFA (AEROSPAN) 80 MCG/ACT AERS Inhale 2 puffs into the lungs 2 (two) times daily.  2 Inhaler  0  . FLUoxetine (PROZAC) 40 MG capsule Take 40 mg by mouth  daily.      Marland Kitchen HYDROcodone-acetaminophen (NORCO) 7.5-325 MG per tablet Take 1 tablet by mouth every 6 (six) hours as needed. For pain      . LORazepam (ATIVAN) 0.5 MG tablet Take by mouth at bedtime.       Marland Kitchen losartan (COZAAR) 100 MG tablet Take 100 mg by mouth daily.      . methazolamide (NEPTAZANE) 25 MG tablet Take 1 tablet (25 mg total) by mouth daily.  30 tablet  3  . Multiple Vitamins-Minerals (ICAPS PO) Take 1 tablet by mouth daily.      Marland Kitchen omeprazole (PRILOSEC) 40 MG capsule Take 40 mg by mouth 2 (two) times daily.      . pregabalin (LYRICA) 50 MG capsule Take 50 mg by mouth 2 (two) times daily.      . SUMAtriptan (IMITREX) 50 MG tablet Take 50 mg by mouth every 2 (two) hours as needed. For migraine       No current  facility-administered medications on file prior to visit.    ROS:  Out of a complete 14 system review of symptoms, the patient complains only of the following symptoms, and all other reviewed systems are negative.  Weight gain Ringing in the ears, difficulty swallowing Light sensitivity Shortness of breath Heat intolerance, flushing Environmental and food allergies Depression Tremor  Blood pressure 140/73, pulse 89, weight 187 lb (84.823 kg).  Physical Exam  General: The patient is alert and cooperative at the time of the examination. The patient is moderately obese.  Skin: No significant peripheral edema is noted.   Neurologic Exam  Mental status: The Mini-Mental status examination done today shows a total score 29/30.  Cranial nerves: Facial symmetry is present. Speech is normal, no aphasia or dysarthria is noted. Extraocular movements are full. Visual fields are full. A mild vocal tremor is noted.  Motor: The patient has good strength in all 4 extremities.  Sensory examination: Soft touch sensation on the face, arms, and legs is symmetric.  Coordination: The patient has good finger-nose-finger and heel-to-shin bilaterally.  Gait and station: The patient has a normal gait, the patient has good arm swing. The patient is able to arise from a seated position with the arms crossed. Tandem gait is slightly unsteady. Romberg is negative. No drift is seen.  Reflexes: Deep tendon reflexes are symmetric.   Assessment/Plan:  1. Memory disturbance  2. Benign essential tremor  3. Depression  4. Migraine headache  The patient will be followed over time for her memory issues. She will followup again in about 8 months. The patient may try discontinuing the methazolamide for her tremor and headache to see if this medication has elicited any beneficial effects for her. The patient will continue to followup with Dr. Evelene Croon. If the depression is felt to be disabling, and it is  refractory to medical therapy, ECT treatments could be done, but this certainly could result in increased memory problems in the future.  Marlan Palau MD 01/26/2013 10:01 AM  [facility] [address] [place], [place] [address]  Phone [phone] Fax [phone]

## 2013-01-24 NOTE — Patient Instructions (Signed)
Tremor  Tremor is a rhythmic, involuntary muscular contraction characterized by oscillations (to-and-fro movements) of a part of the body. The most common of all involuntary movements, tremor can affect various body parts such as the hands, head, facial structures, vocal cords, trunk, and legs; most tremors, however, occur in the hands. Tremor often accompanies neurological disorders associated with aging. Although the disorder is not life-threatening, it can be responsible for functional disability and social embarrassment.  TREATMENT   There are many types of tremor and several ways in which tremor is classified. The most common classification is by behavioral context or position. There are five categories of tremor within this classification: resting, postural, kinetic, task-specific, and psychogenic. Resting or static tremor occurs when the muscle is at rest, for example when the hands are lying on the lap. This type of tremor is often seen in patients with Parkinson's disease. Postural tremor occurs when a patient attempts to maintain posture, such as holding the hands outstretched. Postural tremors include physiological tremor, essential tremor, tremor with basal ganglia disease (also seen in patients with Parkinson's disease), cerebellar postural tremor, tremor with peripheral neuropathy, post-traumatic tremor, and alcoholic tremor. Kinetic or intention (action) tremor occurs during purposeful movement, for example during finger-to-nose testing. Task-specific tremor appears when performing goal-oriented tasks such as handwriting, speaking, or standing. This group consists of primary writing tremor, vocal tremor, and orthostatic tremor. Psychogenic tremor occurs in both older and younger patients. The key feature of this tremor is that it dramatically lessens or disappears when the patient is distracted.  PROGNOSIS  There are some treatment options available for tremor; the appropriate treatment depends on  accurate diagnosis of the cause. Some tremors respond to treatment of the underlying condition, for example in some cases of psychogenic tremor treating the patient's underlying mental problem may cause the tremor to disappear. Also, patients with tremor due to Parkinson's disease may be treated with Levodopa drug therapy. Symptomatic drug therapy is available for several other tremors as well. For those cases of tremor in which there is no effective drug treatment, physical measures such as teaching the patient to brace the affected limb during the tremor are sometimes useful. Surgical intervention such as thalamotomy or deep brain stimulation may be useful in certain cases.  Document Released: 01/21/2002 Document Revised: 04/25/2011 Document Reviewed: 01/31/2005  [employer]® Patient Information ©2014 [employer].

## 2013-02-01 ENCOUNTER — Ambulatory Visit (INDEPENDENT_AMBULATORY_CARE_PROVIDER_SITE_OTHER): Payer: Medicare Other | Admitting: Adult Health

## 2013-02-01 ENCOUNTER — Encounter: Payer: Self-pay | Admitting: Adult Health

## 2013-02-01 VITALS — BP 144/84 | HR 97 | Temp 97.7°F | Ht 63.75 in | Wt 187.4 lb

## 2013-02-01 DIAGNOSIS — J45909 Unspecified asthma, uncomplicated: Secondary | ICD-10-CM | POA: Diagnosis not present

## 2013-02-01 MED ORDER — ALBUTEROL SULFATE HFA 108 (90 BASE) MCG/ACT IN AERS: 2.0000 | INHALATION_SPRAY | RESPIRATORY_TRACT | Status: DC | PRN

## 2013-02-01 NOTE — Assessment & Plan Note (Addendum)
Well-controlled asthma, mild rhinitis, flare. Over the last week  Plan:  Continue on AeroSpan 2 puffs Twice daily  -brush/rinse/gargle after use.  ProAir HFA 2 puffs every 4-6 hours as needed. For wheezing or shortness of breath. This is a rescue inhaler. Followup with Dr.Alva  in 6 months and as needed

## 2013-02-01 NOTE — Patient Instructions (Addendum)
Continue on AeroSpan 2 puffs Twice daily  -brush/rinse/gargle after use.  ProAir HFA 2 puffs every 4-6 hours as needed. For wheezing or shortness of breath. This is a rescue inhaler. Followup with Dr. Vassie Loll in 6 months and as needed

## 2013-02-01 NOTE — Progress Notes (Signed)
Subjective:    Patient ID: [name], female    DOB: 10/01/1940, 72 y.o.   MRN: [id number]  HPI  72 years old never smoker with multiple medical problems including movement disorder,  hypertension dyslipidemia , asthma,  depression and fibromyalgia presents for evaluation of dyspnea with and without exertion Initial pulmonary consult 05/24/12   05/24/12 IOV  She reports dyspnea while standing or walking and worse when doing things. This is gradually getting worse for 2 years. She is particularly concerned about her family history of pulmonary embolism in her mother and lung cancer in a maternal aunt. Denies wheezing, orthopnea, paroxysmal nocturnal dyspnea or pedal edema. She had an emergency room visit on 03/03/2012. Her cardiac markers and troponins were negative chest x-ray was negative EKG was unremarkable .She was seen by the cardiologist & underwent nuclear stress test which was negative for any ischemia.  It was felt that her atypical chest pain may have been due to GERD or anxiety CT chest in 07/2007 was performed due to abnormal chest x-ray, which showed No acute process in the chest. No lung nodule. The plain film abnormality was felt to be artifactual due to summation shadow. >CT chest neg for PE, dependent  atx, no ILD changes. Shoulder DJD   06/26/12 Follow up w/ PFT  4 week follow up w/ PFT .  Seen 1 month ago for initial pulmonary consult for Dyspnea.  Has FH of PE. D Dimer was mild elevated -subsequent CT chest showed -neg for PE, dependent  atx, no ILD changes. Shoulder DJD  Since last ov dyspnea is slightly improved since last ov.   Does have intermittent  SOB, chest tightness, dry cough this morning.  No discolored mucus, wt loss, hemoptysis , edema or orthopnea. No exertional chest pain.  PFT today showed Nml FEV1 at 114% , min drop in ratio at 63,  Mild obstruction in mid flows at 58% w/  20% BD response.  >>cont   02/01/2013 Follow up Asthma  6 month follow up  Asthma.  Reports breathing has been doing well.  Does have some throat clearing and mild DOE for 1 week.  Denies any SABA use.  She denies any discolored mucus or fever, chest pain, orthopnea, PND, or leg swelling. Remains on Aerospan 2 puffs twice daily.     Past Medical History  Diagnosis Date  . Fibromyalgia   . Migraines   . Hypertension   . Hyperlipidemia   . GERD (gastroesophageal reflux disease)   . Asthma   . Depression   . Diverticulosis   . Occasional tremors   . Chronic kidney disease     CKD Stage 3  . Essential tremor 05/17/2012  . Memory loss 05/17/2012  . Chronic renal insufficiency, stage III (moderate)     Past Surgical History  Procedure Laterality Date  . Total hip arthroplasty      bilateral  . Appendectomy    . Tonsillectomy    . Abdominal hysterectomy    . Adenoidectomy    . Cataract extraction      Allergies  Allergen Reactions  . Ciprofloxacin Nausea Only  . Erythromycin Other (See Comments)    Stomach burning  . Sulfa Antibiotics Nausea Only  . Latex Rash  . Shellfish Allergy Rash    History   Social History  . Marital Status: Married    Spouse Name: [name]    Number of Children: 2  . Years of Education: [name]   Occupational History  .  Retired   .     Social History Main Topics  . Smoking status: Never Smoker   . Smokeless tobacco: Never Used  . Alcohol Use: No  . Drug Use: No  . Sexual Activity: No   Other Topics Concern  . Not on file   Social History Narrative  . No narrative on file    Family History  Problem Relation Age of Onset  . Stroke Maternal Grandmother   . Heart failure Maternal Grandfather   . Coronary artery disease Paternal Grandmother   . Coronary artery disease Paternal Grandfather   . Liver cancer Mother   . Prostate cancer Father   . Prostate cancer Paternal Uncle   . ALS Paternal Aunt   . Brain cancer Paternal Aunt   . Breast cancer Paternal Aunt   . Lung cancer Maternal Aunt   . Emphysema  Maternal Grandfather   . Congestive Heart Failure Maternal Grandfather   . Pulmonary embolism Mother   . Depression Brother   . Diabetes      father side      Review of Systems  neg for any significant sore throat, dysphagia, itching, sneezing, nasal congestion or excess/ purulent secretions, fever, chills, sweats, unintended wt loss, pleuritic or exertional cp, hempoptysis, orthopnea pnd or change in chronic leg swelling. Also denies presyncope, palpitations, heartburn, abdominal pain, nausea, vomiting, diarrhea or change in bowel or urinary habits, dysuria,hematuria, rash, arthralgias, visual complaints, headache, numbness weakness or ataxia.     Objective:   Physical Exam   Gen. Pleasant, well-nourished, in no distress, normal affect ENT - no lesions, no post nasal drip Neck: No JVD, no thyromegaly, no carotid bruits Lungs: no use of accessory muscles, no dullness to percussion, clear without rales or rhonchi  Cardiovascular: Rhythm regular, heart sounds  normal, no murmurs or gallops, no peripheral edema Abdomen: soft and non-tender, no hepatosplenomegaly, BS normal. Musculoskeletal: No deformities, no cyanosis or clubbing Neuro:  alert, non focal, bilateral hand tremor        Assessment & Plan:

## 2013-02-10 ENCOUNTER — Other Ambulatory Visit: Payer: Self-pay | Admitting: Neurology

## 2013-02-19 ENCOUNTER — Telehealth: Payer: Self-pay

## 2013-02-19 MED ORDER — METHAZOLAMIDE 25 MG PO TABS: 25.0000 mg | ORAL_TABLET | Freq: Every day | ORAL | Status: DC

## 2013-02-19 NOTE — Telephone Encounter (Signed)
Patient called stating her ins has changed, so she can no longer use [facility].  She can use [facility], but they do not have her meds in stock, so she asked that we send the Rx to [facility] so she can pick it up there.

## 2013-02-24 DIAGNOSIS — M19019 Primary osteoarthritis, unspecified shoulder: Secondary | ICD-10-CM | POA: Diagnosis not present

## 2013-02-24 DIAGNOSIS — T148XXA Other injury of unspecified body region, initial encounter: Secondary | ICD-10-CM | POA: Diagnosis not present

## 2013-02-24 DIAGNOSIS — IMO0002 Reserved for concepts with insufficient information to code with codable children: Secondary | ICD-10-CM | POA: Diagnosis not present

## 2013-02-26 DIAGNOSIS — W19XXXA Unspecified fall, initial encounter: Secondary | ICD-10-CM | POA: Diagnosis not present

## 2013-02-26 DIAGNOSIS — M549 Dorsalgia, unspecified: Secondary | ICD-10-CM | POA: Diagnosis not present

## 2013-02-26 DIAGNOSIS — M79609 Pain in unspecified limb: Secondary | ICD-10-CM | POA: Diagnosis not present

## 2013-03-05 ENCOUNTER — Ambulatory Visit: Payer: Medicare Other | Attending: Nurse Practitioner | Admitting: Physical Therapy

## 2013-03-05 ENCOUNTER — Other Ambulatory Visit (HOSPITAL_COMMUNITY): Payer: Medicare Other | Admitting: Geriatric Medicine

## 2013-03-05 DIAGNOSIS — M25519 Pain in unspecified shoulder: Secondary | ICD-10-CM | POA: Diagnosis not present

## 2013-03-05 DIAGNOSIS — IMO0001 Reserved for inherently not codable concepts without codable children: Secondary | ICD-10-CM | POA: Insufficient documentation

## 2013-03-05 DIAGNOSIS — J45909 Unspecified asthma, uncomplicated: Secondary | ICD-10-CM | POA: Diagnosis not present

## 2013-03-05 DIAGNOSIS — I10 Essential (primary) hypertension: Secondary | ICD-10-CM | POA: Insufficient documentation

## 2013-03-05 DIAGNOSIS — M546 Pain in thoracic spine: Secondary | ICD-10-CM | POA: Insufficient documentation

## 2013-03-05 DIAGNOSIS — R5381 Other malaise: Secondary | ICD-10-CM | POA: Insufficient documentation

## 2013-03-05 DIAGNOSIS — Z1231 Encounter for screening mammogram for malignant neoplasm of breast: Secondary | ICD-10-CM

## 2013-03-07 ENCOUNTER — Ambulatory Visit: Payer: Medicare Other | Admitting: *Deleted

## 2013-03-07 DIAGNOSIS — M546 Pain in thoracic spine: Secondary | ICD-10-CM | POA: Diagnosis not present

## 2013-03-07 DIAGNOSIS — J45909 Unspecified asthma, uncomplicated: Secondary | ICD-10-CM | POA: Diagnosis not present

## 2013-03-07 DIAGNOSIS — I10 Essential (primary) hypertension: Secondary | ICD-10-CM | POA: Diagnosis not present

## 2013-03-07 DIAGNOSIS — R5381 Other malaise: Secondary | ICD-10-CM | POA: Diagnosis not present

## 2013-03-07 DIAGNOSIS — M25519 Pain in unspecified shoulder: Secondary | ICD-10-CM | POA: Diagnosis not present

## 2013-03-07 DIAGNOSIS — IMO0001 Reserved for inherently not codable concepts without codable children: Secondary | ICD-10-CM | POA: Diagnosis not present

## 2013-03-11 ENCOUNTER — Encounter: Payer: Medicare Other | Admitting: Physical Therapy

## 2013-03-12 ENCOUNTER — Ambulatory Visit
Admission: RE | Admit: 2013-03-12 | Discharge: 2013-03-12 | Disposition: A | Discharge status: Home / Self Care | Payer: Medicare Other | Source: Ambulatory Visit | Attending: Nurse Practitioner | Admitting: Nurse Practitioner

## 2013-03-12 ENCOUNTER — Other Ambulatory Visit: Payer: Self-pay | Admitting: Nurse Practitioner

## 2013-03-12 DIAGNOSIS — M79609 Pain in unspecified limb: Secondary | ICD-10-CM | POA: Diagnosis not present

## 2013-03-12 DIAGNOSIS — M549 Dorsalgia, unspecified: Secondary | ICD-10-CM | POA: Diagnosis not present

## 2013-03-12 DIAGNOSIS — S2249XA Multiple fractures of ribs, unspecified side, initial encounter for closed fracture: Secondary | ICD-10-CM | POA: Diagnosis not present

## 2013-03-14 ENCOUNTER — Encounter: Payer: Medicare Other | Admitting: Physical Therapy

## 2013-03-19 DIAGNOSIS — Z01419 Encounter for gynecological examination (general) (routine) without abnormal findings: Secondary | ICD-10-CM | POA: Diagnosis not present

## 2013-03-25 DIAGNOSIS — I10 Essential (primary) hypertension: Secondary | ICD-10-CM | POA: Diagnosis not present

## 2013-03-25 DIAGNOSIS — Z79899 Other long term (current) drug therapy: Secondary | ICD-10-CM | POA: Diagnosis not present

## 2013-03-25 DIAGNOSIS — Z0000 Encounter for general adult medical examination without abnormal findings: Secondary | ICD-10-CM | POA: Diagnosis not present

## 2013-03-25 DIAGNOSIS — F329 Major depressive disorder, single episode, unspecified: Secondary | ICD-10-CM | POA: Diagnosis not present

## 2013-03-25 DIAGNOSIS — E782 Mixed hyperlipidemia: Secondary | ICD-10-CM | POA: Diagnosis not present

## 2013-03-28 DIAGNOSIS — E782 Mixed hyperlipidemia: Secondary | ICD-10-CM | POA: Diagnosis not present

## 2013-03-28 DIAGNOSIS — Z79899 Other long term (current) drug therapy: Secondary | ICD-10-CM | POA: Diagnosis not present

## 2013-04-04 ENCOUNTER — Ambulatory Visit (HOSPITAL_COMMUNITY): Payer: Medicare Other

## 2013-04-04 DIAGNOSIS — N390 Urinary tract infection, site not specified: Secondary | ICD-10-CM | POA: Diagnosis not present

## 2013-04-10 ENCOUNTER — Telehealth: Payer: Self-pay | Admitting: Pulmonary Disease

## 2013-04-10 MED ORDER — FLUNISOLIDE HFA 80 MCG/ACT IN AERS: 2.0000 | INHALATION_SPRAY | Freq: Two times a day (BID) | RESPIRATORY_TRACT | Status: DC

## 2013-04-10 NOTE — Telephone Encounter (Signed)
Spoke with pt. rx has been sent. Nothing further needed

## 2013-04-12 ENCOUNTER — Telehealth: Payer: Self-pay | Admitting: Pulmonary Disease

## 2013-04-12 MED ORDER — FLUNISOLIDE HFA 80 MCG/ACT IN AERS: 2.0000 | INHALATION_SPRAY | Freq: Two times a day (BID) | RESPIRATORY_TRACT | Status: DC

## 2013-04-12 NOTE — Telephone Encounter (Signed)
Called and initiate PA for pt aerospan.  Was advised I will receive a response in 48-72 hrs.  Case ID : [id number] Pt aware samples left for pick up.

## 2013-04-17 NOTE — Telephone Encounter (Signed)
Received approval for aerospan 04/12/13-02/13/14 Case # [id number] lmtcb x1 for pt

## 2013-04-17 NOTE — Telephone Encounter (Signed)
Pt advised. [name], CMA  

## 2013-05-09 ENCOUNTER — Ambulatory Visit (HOSPITAL_COMMUNITY): Payer: Medicare Other

## 2013-05-14 DIAGNOSIS — R109 Unspecified abdominal pain: Secondary | ICD-10-CM | POA: Diagnosis not present

## 2013-05-14 DIAGNOSIS — N183 Chronic kidney disease, stage 3 unspecified: Secondary | ICD-10-CM | POA: Diagnosis not present

## 2013-05-14 DIAGNOSIS — R269 Unspecified abnormalities of gait and mobility: Secondary | ICD-10-CM | POA: Diagnosis not present

## 2013-05-14 DIAGNOSIS — I129 Hypertensive chronic kidney disease with stage 1 through stage 4 chronic kidney disease, or unspecified chronic kidney disease: Secondary | ICD-10-CM | POA: Diagnosis not present

## 2013-05-14 DIAGNOSIS — R51 Headache: Secondary | ICD-10-CM | POA: Diagnosis not present

## 2013-05-14 DIAGNOSIS — R5383 Other fatigue: Secondary | ICD-10-CM | POA: Diagnosis not present

## 2013-05-14 DIAGNOSIS — R5381 Other malaise: Secondary | ICD-10-CM | POA: Diagnosis not present

## 2013-05-14 DIAGNOSIS — R110 Nausea: Secondary | ICD-10-CM | POA: Diagnosis not present

## 2013-05-15 ENCOUNTER — Other Ambulatory Visit: Payer: Self-pay | Admitting: Geriatric Medicine

## 2013-05-15 DIAGNOSIS — R51 Headache: Principal | ICD-10-CM

## 2013-05-15 DIAGNOSIS — R519 Headache, unspecified: Secondary | ICD-10-CM

## 2013-05-15 DIAGNOSIS — R109 Unspecified abdominal pain: Secondary | ICD-10-CM

## 2013-05-17 NOTE — Telephone Encounter (Signed)
Closing encounter

## 2013-05-21 ENCOUNTER — Ambulatory Visit (HOSPITAL_COMMUNITY)
Admission: RE | Admit: 2013-05-21 | Discharge: 2013-05-21 | Disposition: A | Discharge status: Home / Self Care | Payer: Medicare Other | Source: Ambulatory Visit | Attending: Geriatric Medicine | Admitting: Geriatric Medicine

## 2013-05-21 DIAGNOSIS — Z1231 Encounter for screening mammogram for malignant neoplasm of breast: Secondary | ICD-10-CM

## 2013-05-22 ENCOUNTER — Other Ambulatory Visit: Payer: Self-pay | Admitting: Geriatric Medicine

## 2013-05-22 ENCOUNTER — Ambulatory Visit
Admission: RE | Admit: 2013-05-22 | Discharge: 2013-05-22 | Disposition: A | Discharge status: Home / Self Care | Payer: Medicare Other | Source: Ambulatory Visit | Attending: Geriatric Medicine | Admitting: Geriatric Medicine

## 2013-05-22 ENCOUNTER — Other Ambulatory Visit: Payer: Medicare Other

## 2013-05-22 DIAGNOSIS — R519 Headache, unspecified: Secondary | ICD-10-CM

## 2013-05-22 DIAGNOSIS — R109 Unspecified abdominal pain: Secondary | ICD-10-CM

## 2013-05-22 DIAGNOSIS — R51 Headache: Secondary | ICD-10-CM | POA: Diagnosis not present

## 2013-05-22 DIAGNOSIS — S0990XA Unspecified injury of head, initial encounter: Secondary | ICD-10-CM | POA: Diagnosis not present

## 2013-05-22 MED ORDER — IOHEXOL 300 MG/ML  SOLN
75.0000 mL | Freq: Once | INTRAMUSCULAR | Status: AC | PRN
  Administered 2013-05-22: 75 mL via INTRAVENOUS

## 2013-05-27 ENCOUNTER — Other Ambulatory Visit: Payer: Self-pay | Admitting: Pulmonary Disease

## 2013-05-27 DIAGNOSIS — E782 Mixed hyperlipidemia: Secondary | ICD-10-CM | POA: Diagnosis not present

## 2013-06-07 DIAGNOSIS — K130 Diseases of lips: Secondary | ICD-10-CM | POA: Diagnosis not present

## 2013-06-07 DIAGNOSIS — L259 Unspecified contact dermatitis, unspecified cause: Secondary | ICD-10-CM | POA: Diagnosis not present

## 2013-06-07 DIAGNOSIS — L538 Other specified erythematous conditions: Secondary | ICD-10-CM | POA: Diagnosis not present

## 2013-06-09 ENCOUNTER — Other Ambulatory Visit: Payer: Self-pay | Admitting: Neurology

## 2013-06-25 ENCOUNTER — Encounter: Payer: Self-pay | Admitting: Neurology

## 2013-08-12 ENCOUNTER — Encounter: Payer: Self-pay | Admitting: Pulmonary Disease

## 2013-08-12 ENCOUNTER — Ambulatory Visit (INDEPENDENT_AMBULATORY_CARE_PROVIDER_SITE_OTHER): Payer: Medicare Other | Admitting: Pulmonary Disease

## 2013-08-12 VITALS — BP 124/60 | HR 94 | Temp 98.0°F | Ht 64.0 in | Wt 176.2 lb

## 2013-08-12 DIAGNOSIS — J4530 Mild persistent asthma, uncomplicated: Secondary | ICD-10-CM

## 2013-08-12 DIAGNOSIS — J45909 Unspecified asthma, uncomplicated: Secondary | ICD-10-CM

## 2013-08-12 DIAGNOSIS — R300 Dysuria: Secondary | ICD-10-CM | POA: Diagnosis not present

## 2013-08-12 NOTE — Progress Notes (Signed)
   Subjective:    Patient ID: [name], female    DOB: 1940-07-30, 73 y.o.   MRN: [id number]  HPI  73 years old never smoker for FU of asthma She has multiple medical problems including movement disorder, hypertension dyslipidemia , asthma, depression and fibromyalgia Initial pulmonary consult 05/24/12  05/24/12 IOV  She has family history of pulmonary embolism in her mother and lung cancer in a maternal aunt.She was seen by the cardiologist & underwent nuclear stress test which was negative for any ischemia.  It was felt that her atypical chest pain may have been due to GERD or anxiety  CT chest in 07/2007, 05/2012 neg    06/26/12 PFT today showed Nml FEV1 at 114% , min drop in ratio at 63, Mild obstruction in mid flows at 58% w/ 20% BD response.    08/12/2013  Chief Complaint  Patient presents with  . Follow-up    reports breathing has been 'fine". Notices some SOB w/ long walks. She has not had to use her albuterol inhaler. No wheezing, no chest tx, no ocugh.    74m FU  Denies any SABA use.  She denies any discolored mucus or fever, chest pain, orthopnea, PND, or leg swelling.  Remains on Aerospan 2 puffs twice daily.    Review of Systems neg for any significant sore throat, dysphagia, itching, sneezing, nasal congestion or excess/ purulent secretions, fever, chills, sweats, unintended wt loss, pleuritic or exertional cp, hempoptysis, orthopnea pnd or change in chronic leg swelling. Also denies presyncope, palpitations, heartburn, abdominal pain, nausea, vomiting, diarrhea or change in bowel or urinary habits, dysuria,hematuria, rash, arthralgias, visual complaints, headache, numbness weakness or ataxia.     Objective:   Physical Exam  Gen. Pleasant, well-nourished, in no distress ENT - no lesions, no post nasal drip Neck: No JVD, no thyromegaly, no carotid bruits Lungs: no use of accessory muscles, no dullness to percussion, clear without rales or rhonchi    Cardiovascular: Rhythm regular, heart sounds  normal, no murmurs or gallops, no peripheral edema Musculoskeletal: No deformities, no cyanosis or clubbing        Assessment & Plan:

## 2013-08-12 NOTE — Assessment & Plan Note (Signed)
Attempt to Step down asthma  therapy Decrease aerospan to once daily x 3 months, then stop if feeling ok Use albuterol 2 puffs as needed for rescue , if wheezing

## 2013-08-12 NOTE — Patient Instructions (Signed)
Decrease aerospan to once daily x 3 months, then stop if feeling ok Use albuterol 2 puffs as needed for rescue , if wheezing

## 2013-08-20 ENCOUNTER — Encounter: Payer: Self-pay | Admitting: Neurology

## 2013-08-20 ENCOUNTER — Ambulatory Visit (INDEPENDENT_AMBULATORY_CARE_PROVIDER_SITE_OTHER): Payer: Medicare Other | Admitting: Neurology

## 2013-08-20 VITALS — BP 160/90 | HR 93 | Wt 174.0 lb

## 2013-08-20 DIAGNOSIS — G250 Essential tremor: Secondary | ICD-10-CM

## 2013-08-20 DIAGNOSIS — G43019 Migraine without aura, intractable, without status migrainosus: Secondary | ICD-10-CM | POA: Diagnosis not present

## 2013-08-20 DIAGNOSIS — R413 Other amnesia: Secondary | ICD-10-CM | POA: Diagnosis not present

## 2013-08-20 DIAGNOSIS — G252 Other specified forms of tremor: Secondary | ICD-10-CM

## 2013-08-20 NOTE — Patient Instructions (Signed)
Tremor Tremor is a rhythmic, involuntary muscular contraction characterized by oscillations (to-and-fro movements) of a part of the body. The most common of all involuntary movements, tremor can affect various body parts such as the hands, head, facial structures, vocal cords, trunk, and legs; most tremors, however, occur in the hands. Tremor often accompanies neurological disorders associated with aging. Although the disorder is not life-threatening, it can be responsible for functional disability and social embarrassment. TREATMENT  There are many types of tremor and several ways in which tremor is classified. The most common classification is by behavioral context or position. There are five categories of tremor within this classification: resting, postural, kinetic, task-specific, and psychogenic. Resting or static tremor occurs when the muscle is at rest, for example when the hands are lying on the lap. This type of tremor is often seen in patients with Parkinson's disease. Postural tremor occurs when a patient attempts to maintain posture, such as holding the hands outstretched. Postural tremors include physiological tremor, essential tremor, tremor with basal ganglia disease (also seen in patients with Parkinson's disease), cerebellar postural tremor, tremor with peripheral neuropathy, post-traumatic tremor, and alcoholic tremor. Kinetic or intention (action) tremor occurs during purposeful movement, for example during finger-to-nose testing. Task-specific tremor appears when performing goal-oriented tasks such as handwriting, speaking, or standing. This group consists of primary writing tremor, vocal tremor, and orthostatic tremor. Psychogenic tremor occurs in both older and younger patients. The key feature of this tremor is that it dramatically lessens or disappears when the patient is distracted. PROGNOSIS There are some treatment options available for tremor; the appropriate treatment depends on  accurate diagnosis of the cause. Some tremors respond to treatment of the underlying condition, for example in some cases of psychogenic tremor treating the patient's underlying mental problem may cause the tremor to disappear. Also, patients with tremor due to Parkinson's disease may be treated with Levodopa drug therapy. Symptomatic drug therapy is available for several other tremors as well. For those cases of tremor in which there is no effective drug treatment, physical measures such as teaching the patient to brace the affected limb during the tremor are sometimes useful. Surgical intervention such as thalamotomy or deep brain stimulation may be useful in certain cases. Document Released: 01/21/2002 Document Revised: 04/25/2011 Document Reviewed: 01/31/2005 [facility] Patient Information 2015 [place], [place]. This information is not intended to replace advice given to you by your health care provider. Make sure you discuss any questions you have with your health care provider. Tremor Tremor is a rhythmic, involuntary muscular contraction characterized by oscillations (to-and-fro movements) of a part of the body. The most common of all involuntary movements, tremor can affect various body parts such as the hands, head, facial structures, vocal cords, trunk, and legs; most tremors, however, occur in the hands. Tremor often accompanies neurological disorders associated with aging. Although the disorder is not life-threatening, it can be responsible for functional disability and social embarrassment. TREATMENT  There are many types of tremor and several ways in which tremor is classified. The most common classification is by behavioral context or position. There are five categories of tremor within this classification: resting, postural, kinetic, task-specific, and psychogenic. Resting or static tremor occurs when the muscle is at rest, for example when the hands are lying on the lap. This type of tremor is  often seen in patients with Parkinson's disease. Postural tremor occurs when a patient attempts to maintain posture, such as holding the hands outstretched. Postural tremors include physiological tremor, essential tremor,  tremor with basal ganglia disease (also seen in patients with Parkinson's disease), cerebellar postural tremor, tremor with peripheral neuropathy, post-traumatic tremor, and alcoholic tremor. Kinetic or intention (action) tremor occurs during purposeful movement, for example during finger-to-nose testing. Task-specific tremor appears when performing goal-oriented tasks such as handwriting, speaking, or standing. This group consists of primary writing tremor, vocal tremor, and orthostatic tremor. Psychogenic tremor occurs in both older and younger patients. The key feature of this tremor is that it dramatically lessens or disappears when the patient is distracted. PROGNOSIS There are some treatment options available for tremor; the appropriate treatment depends on accurate diagnosis of the cause. Some tremors respond to treatment of the underlying condition, for example in some cases of psychogenic tremor treating the patient's underlying mental problem may cause the tremor to disappear. Also, patients with tremor due to Parkinson's disease may be treated with Levodopa drug therapy. Symptomatic drug therapy is available for several other tremors as well. For those cases of tremor in which there is no effective drug treatment, physical measures such as teaching the patient to brace the affected limb during the tremor are sometimes useful. Surgical intervention such as thalamotomy or deep brain stimulation may be useful in certain cases. Document Released: 01/21/2002 Document Revised: 04/25/2011 Document Reviewed: 01/31/2005 [facility] Patient Information 2015 [place], [place]. This information is not intended to replace advice given to you by your health care provider. Make sure you discuss any  questions you have with your health care provider.

## 2013-08-20 NOTE — Progress Notes (Signed)
Reason for visit: Tremors  [name] is an 73 y.o. female  History of present illness:  [name] is a 73 year old right-handed white female with a history of an essential tremor affecting the voice, and upper extremities. She is on methazolamide 25 mg daily. She has a history of significant depression, and electroconvulsive therapy was contemplated previously. The patient said was placed on low-dose Synthroid, and this has significantly improved her energy level, depression, and her memory. The patient feels much better on this medication. The patient has had some headaches off and on, occurring 3 or 4 times a month. The patient indicates that 50 mg of Imitrex will help the headache. The patient reports no other new medical issues that have come up since last seen. The tremor does impact her when she is trying to write. She is able to feed herself fairly well.  Past Medical History  Diagnosis Date  . Fibromyalgia   . Migraines   . Hypertension   . Hyperlipidemia   . GERD (gastroesophageal reflux disease)   . Asthma   . Depression   . Diverticulosis   . Occasional tremors   . Chronic kidney disease     CKD Stage 3  . Essential tremor 05/17/2012  . Memory loss 05/17/2012  . Chronic renal insufficiency, stage III (moderate)     Past Surgical History  Procedure Laterality Date  . Total hip arthroplasty      bilateral  . Appendectomy    . Tonsillectomy    . Abdominal hysterectomy    . Adenoidectomy    . Cataract extraction      Family History  Problem Relation Age of Onset  . Stroke Maternal Grandmother   . Heart failure Maternal Grandfather   . Coronary artery disease Paternal Grandmother   . Coronary artery disease Paternal Grandfather   . Liver cancer Mother   . Prostate cancer Father   . Prostate cancer Paternal Uncle   . ALS Paternal Aunt   . Brain cancer Paternal Aunt   . Breast cancer Paternal Aunt   . Lung cancer Maternal Aunt   . Emphysema Maternal  Grandfather   . Congestive Heart Failure Maternal Grandfather   . Pulmonary embolism Mother   . Depression Brother   . Diabetes      father side    Social history:  reports that she has never smoked. She has never used smokeless tobacco. She reports that she does not drink alcohol or use illicit drugs.    Allergies  Allergen Reactions  . Ciprofloxacin Nausea Only  . Erythromycin Other (See Comments)    Stomach burning  . Sulfa Antibiotics Nausea Only  . Latex Rash  . Shellfish Allergy Rash    Medications:  Current Outpatient Prescriptions on File Prior to Visit  Medication Sig Dispense Refill  . albuterol (PROAIR HFA) 108 (90 BASE) MCG/ACT inhaler Inhale 2 puffs into the lungs every 4 (four) hours as needed for wheezing or shortness of breath.  1 Inhaler  3  . Calcium Carbonate-Vitamin D (CALTRATE 600+D PO) Take 1 tablet by mouth daily.      Marland Kitchen CINNAMON PO Take 1 tablet by mouth daily.      . DULoxetine (CYMBALTA) 30 MG capsule Take 30 mg by mouth daily.      Marland Kitchen HYDROcodone-acetaminophen (NORCO) 7.5-325 MG per tablet Take 1 tablet by mouth 2 (two) times daily. For pain      . levothyroxine (SYNTHROID, LEVOTHROID) 50 MCG tablet Once  daily      . LORazepam (ATIVAN) 0.5 MG tablet Take by mouth at bedtime.       Marland Kitchen losartan (COZAAR) 100 MG tablet Take 100 mg by mouth daily.      . methazolamide (NEPTAZANE) 25 MG tablet TAKE 1 TABLET BY MOUTH EVERY DAY  30 tablet  3  . Multiple Vitamins-Minerals (ICAPS PO) Take 1 tablet by mouth daily.      Marland Kitchen omeprazole (PRILOSEC) 40 MG capsule Take 40 mg by mouth 2 (two) times daily.      . pregabalin (LYRICA) 50 MG capsule Take 50 mg by mouth 2 (two) times daily.      . SUMAtriptan (IMITREX) 50 MG tablet Take 50 mg by mouth every 2 (two) hours as needed. For migraine       No current facility-administered medications on file prior to visit.    ROS:  Out of a complete 14 system review of symptoms, the patient complains only of the following  symptoms, and all other reviewed systems are negative.  Excessive sweating Hearing loss Eye redness, light sensitivity, blurred vision Shortness of breath Chest pain Heat intolerance Swelling in the abdomen, abdominal pain, constipation Environmental allergies, food allergies Incontinence of bladder, frequency of urination, urinary urgency Joint pain, back pain, achy muscles, walking difficulties, neck stiffness Skin rash, moles, itching Bruising easily Memory loss, headache, weakness, tremors Confusion, depression, anxiety, ADHD  Blood pressure 160/90, pulse 93, weight 174 lb (78.926 kg).  Physical Exam  General: The patient is alert and cooperative at the time of the examination.  Skin: No significant peripheral edema is noted.   Neurologic Exam  Mental status: The patient is oriented x 3. The Mini-Mental status examination done today shows a total score 29/30.  Cranial nerves: Facial symmetry is present. Speech is normal, no aphasia or dysarthria is noted. Extraocular movements are full. Visual fields are full. A very slight vocal tremor was noted.  Motor: The patient has good strength in all 4 extremities.  Sensory examination: Soft touch sensation is symmetric on the face, arms, and legs.  Coordination: The patient has good finger-nose-finger and heel-to-shin bilaterally. The patient has tremor with finger-nose-finger bilaterally.  Gait and station: The patient has a normal gait. Tandem gait is normal. Romberg is negative. No drift is seen.  Reflexes: Deep tendon reflexes are symmetric.   Assessment/Plan:  1. History of migraine  2. Benign essential tremor  3. Depression  4. History of memory problems  The patient may be getting some benefit with the tremor on the methazolamide. Treatment of the tremor will be to follow secondary to the underlying depression, as most medications used for the essential tremor may exacerbate depression. She is stable with her  memory issue. She will followup in about 6 months.  Jill Alexanders MD 08/20/2013 7:41 PM  [facility] [address] [place], [place] [address]  Phone [phone] Fax [phone]

## 2013-09-09 ENCOUNTER — Other Ambulatory Visit: Payer: Self-pay | Admitting: Geriatric Medicine

## 2013-09-09 DIAGNOSIS — M545 Low back pain, unspecified: Secondary | ICD-10-CM

## 2013-09-09 DIAGNOSIS — IMO0001 Reserved for inherently not codable concepts without codable children: Secondary | ICD-10-CM | POA: Diagnosis not present

## 2013-09-09 DIAGNOSIS — M549 Dorsalgia, unspecified: Secondary | ICD-10-CM | POA: Diagnosis not present

## 2013-09-15 ENCOUNTER — Other Ambulatory Visit: Payer: Medicare Other

## 2013-09-17 ENCOUNTER — Ambulatory Visit
Admission: RE | Admit: 2013-09-17 | Discharge: 2013-09-17 | Disposition: A | Discharge status: Home / Self Care | Payer: Medicare Other | Source: Ambulatory Visit | Attending: Geriatric Medicine | Admitting: Geriatric Medicine

## 2013-09-17 DIAGNOSIS — M545 Low back pain, unspecified: Secondary | ICD-10-CM

## 2013-09-17 DIAGNOSIS — M5126 Other intervertebral disc displacement, lumbar region: Secondary | ICD-10-CM | POA: Diagnosis not present

## 2013-09-23 DIAGNOSIS — N183 Chronic kidney disease, stage 3 unspecified: Secondary | ICD-10-CM | POA: Diagnosis not present

## 2013-09-23 DIAGNOSIS — E669 Obesity, unspecified: Secondary | ICD-10-CM | POA: Diagnosis not present

## 2013-09-23 DIAGNOSIS — I129 Hypertensive chronic kidney disease with stage 1 through stage 4 chronic kidney disease, or unspecified chronic kidney disease: Secondary | ICD-10-CM | POA: Diagnosis not present

## 2013-09-23 DIAGNOSIS — K5900 Constipation, unspecified: Secondary | ICD-10-CM | POA: Diagnosis not present

## 2013-09-23 DIAGNOSIS — M549 Dorsalgia, unspecified: Secondary | ICD-10-CM | POA: Diagnosis not present

## 2013-09-23 DIAGNOSIS — Z6830 Body mass index (BMI) 30.0-30.9, adult: Secondary | ICD-10-CM | POA: Diagnosis not present

## 2013-09-24 ENCOUNTER — Ambulatory Visit: Payer: Medicare Other | Admitting: Neurology

## 2013-09-26 ENCOUNTER — Telehealth: Payer: Self-pay | Admitting: Pulmonary Disease

## 2013-09-26 NOTE — Telephone Encounter (Signed)
Spoke with the pt  She is asking when her last pneumovax was given  I advised that according to our records, it was in 2007  She states that Dr Felipa Eth has mentioned Prevnar to her and she wants to know if RA feels that this vaccine would be appropriate for her to receive  She is due back to see [place] again in Dec 2015 and may see Dr Felipa Eth before then, but she is unsure  Please advise thanks!

## 2013-09-26 NOTE — Telephone Encounter (Signed)
Spoke with the pt and notified of recs per RA  She verbalized understanding  Nothing further needed 

## 2013-09-26 NOTE — Telephone Encounter (Signed)
Advised to take per new recommendations

## 2013-10-09 DIAGNOSIS — H02059 Trichiasis without entropian unspecified eye, unspecified eyelid: Secondary | ICD-10-CM | POA: Diagnosis not present

## 2013-10-09 DIAGNOSIS — H1030 Unspecified acute conjunctivitis, unspecified eye: Secondary | ICD-10-CM | POA: Diagnosis not present

## 2013-10-13 ENCOUNTER — Other Ambulatory Visit: Payer: Self-pay | Admitting: Neurology

## 2013-10-22 DIAGNOSIS — M47817 Spondylosis without myelopathy or radiculopathy, lumbosacral region: Secondary | ICD-10-CM | POA: Diagnosis not present

## 2013-10-22 DIAGNOSIS — M549 Dorsalgia, unspecified: Secondary | ICD-10-CM | POA: Diagnosis not present

## 2013-11-01 DIAGNOSIS — F331 Major depressive disorder, recurrent, moderate: Secondary | ICD-10-CM | POA: Diagnosis not present

## 2013-11-04 DIAGNOSIS — N390 Urinary tract infection, site not specified: Secondary | ICD-10-CM | POA: Diagnosis not present

## 2013-11-18 ENCOUNTER — Ambulatory Visit: Payer: Medicare Other | Admitting: Physical Therapy

## 2013-11-18 DIAGNOSIS — N390 Urinary tract infection, site not specified: Secondary | ICD-10-CM | POA: Diagnosis not present

## 2013-11-26 DIAGNOSIS — Z23 Encounter for immunization: Secondary | ICD-10-CM | POA: Diagnosis not present

## 2013-12-02 DIAGNOSIS — N390 Urinary tract infection, site not specified: Secondary | ICD-10-CM | POA: Diagnosis not present

## 2013-12-09 ENCOUNTER — Telehealth: Payer: Self-pay | Admitting: Pulmonary Disease

## 2013-12-09 MED ORDER — FLUNISOLIDE HFA 80 MCG/ACT IN AERS: 80.0000 ug | INHALATION_SPRAY | Freq: Every day | RESPIRATORY_TRACT | Status: DC

## 2013-12-09 NOTE — Telephone Encounter (Signed)
Pt states that she was given some samples of AeroSpan at last OV 08/2013 States that she was given samples that are now expired. Pt wanting to know if there are any samples that she can have--pt is in donut hole.    Pt given 2 samples of Aerospan--placed up front.  Last Ov she was advised that she could stop taking around Oct 2015 if feeling okay--Pt states that she does not feel as though she can come off of this inhaler at this time.

## 2013-12-11 DIAGNOSIS — L821 Other seborrheic keratosis: Secondary | ICD-10-CM | POA: Diagnosis not present

## 2013-12-11 DIAGNOSIS — L309 Dermatitis, unspecified: Secondary | ICD-10-CM | POA: Diagnosis not present

## 2013-12-11 DIAGNOSIS — L720 Epidermal cyst: Secondary | ICD-10-CM | POA: Diagnosis not present

## 2013-12-11 DIAGNOSIS — D225 Melanocytic nevi of trunk: Secondary | ICD-10-CM | POA: Diagnosis not present

## 2013-12-17 DIAGNOSIS — Z961 Presence of intraocular lens: Secondary | ICD-10-CM | POA: Diagnosis not present

## 2014-01-01 ENCOUNTER — Encounter: Payer: Self-pay | Admitting: Neurology

## 2014-01-07 ENCOUNTER — Encounter: Payer: Self-pay | Admitting: Neurology

## 2014-01-08 DIAGNOSIS — L308 Other specified dermatitis: Secondary | ICD-10-CM | POA: Diagnosis not present

## 2014-01-11 DIAGNOSIS — M7020 Olecranon bursitis, unspecified elbow: Secondary | ICD-10-CM | POA: Diagnosis not present

## 2014-02-04 DIAGNOSIS — N390 Urinary tract infection, site not specified: Secondary | ICD-10-CM | POA: Diagnosis not present

## 2014-02-11 ENCOUNTER — Other Ambulatory Visit: Payer: Self-pay

## 2014-02-11 MED ORDER — METHAZOLAMIDE 25 MG PO TABS: 25.0000 mg | ORAL_TABLET | Freq: Every day | ORAL | Status: DC

## 2014-02-24 ENCOUNTER — Ambulatory Visit: Payer: Medicare Other | Admitting: Adult Health

## 2014-03-17 DIAGNOSIS — N3020 Other chronic cystitis without hematuria: Secondary | ICD-10-CM | POA: Diagnosis not present

## 2014-03-17 DIAGNOSIS — N289 Disorder of kidney and ureter, unspecified: Secondary | ICD-10-CM | POA: Diagnosis not present

## 2014-03-17 DIAGNOSIS — N3941 Urge incontinence: Secondary | ICD-10-CM | POA: Diagnosis not present

## 2014-03-17 DIAGNOSIS — N952 Postmenopausal atrophic vaginitis: Secondary | ICD-10-CM | POA: Diagnosis not present

## 2014-03-24 DIAGNOSIS — E039 Hypothyroidism, unspecified: Secondary | ICD-10-CM | POA: Diagnosis not present

## 2014-03-24 DIAGNOSIS — Z79899 Other long term (current) drug therapy: Secondary | ICD-10-CM | POA: Diagnosis not present

## 2014-03-24 DIAGNOSIS — N183 Chronic kidney disease, stage 3 (moderate): Secondary | ICD-10-CM | POA: Diagnosis not present

## 2014-03-24 DIAGNOSIS — I129 Hypertensive chronic kidney disease with stage 1 through stage 4 chronic kidney disease, or unspecified chronic kidney disease: Secondary | ICD-10-CM | POA: Diagnosis not present

## 2014-03-24 DIAGNOSIS — M791 Myalgia: Secondary | ICD-10-CM | POA: Diagnosis not present

## 2014-03-24 DIAGNOSIS — M797 Fibromyalgia: Secondary | ICD-10-CM | POA: Diagnosis not present

## 2014-03-25 ENCOUNTER — Other Ambulatory Visit (HOSPITAL_COMMUNITY): Payer: Self-pay | Admitting: Geriatric Medicine

## 2014-03-25 DIAGNOSIS — Z1231 Encounter for screening mammogram for malignant neoplasm of breast: Secondary | ICD-10-CM

## 2014-03-26 ENCOUNTER — Telehealth: Payer: Self-pay | Admitting: Pulmonary Disease

## 2014-03-26 MED ORDER — FLUNISOLIDE HFA 80 MCG/ACT IN AERS: 80.0000 ug | INHALATION_SPRAY | Freq: Every day | RESPIRATORY_TRACT | Status: DC

## 2014-03-26 NOTE — Telephone Encounter (Signed)
Spoke with pt and advised that sample of Aerospan was left at front desk and to keep upcoming appt with Tammy Parrett.

## 2014-04-03 ENCOUNTER — Ambulatory Visit (INDEPENDENT_AMBULATORY_CARE_PROVIDER_SITE_OTHER): Payer: Medicare Other | Admitting: Adult Health

## 2014-04-03 ENCOUNTER — Encounter: Payer: Self-pay | Admitting: Adult Health

## 2014-04-03 VITALS — BP 114/66 | HR 78 | Temp 98.0°F | Ht 64.0 in | Wt 175.4 lb

## 2014-04-03 DIAGNOSIS — J4520 Mild intermittent asthma, uncomplicated: Secondary | ICD-10-CM

## 2014-04-03 NOTE — Progress Notes (Signed)
   Subjective:    Patient ID: OCEANIA NOORI, female    DOB: [birth year], 74 y.o.   MRN: [id number]  HPI  74 years old never smoker for FU of asthma She has multiple medical problems including movement disorder, hypertension dyslipidemia , asthma, depression and fibromyalgia Initial pulmonary consult 05/24/12  05/24/12 IOV  She has family history of pulmonary embolism in her mother and lung cancer in a maternal aunt.She was seen by the cardiologist & underwent nuclear stress test which was negative for any ischemia.  It was felt that her atypical chest pain may have been due to GERD or anxiety  CT chest in 07/2007, 05/2012 neg    06/26/12 PFT today showed Nml FEV1 at 114% , min drop in ratio at 63, Mild obstruction in mid flows at 58% w/ 20% BD response.    38m FU  Denies any SABA use.  She denies any discolored mucus or fever, chest pain, orthopnea, PND, or leg swelling.  Remains on Aerospan 2 puffs twice daily.   04/03/2014 Follow up Asthma  Patient returns for six-month follow-up for asthma Overall she says she has been doing well She remains on Aerospan, using 1 puff daily She denies any flare of cough or wheezing Patient denies any chest pain, orthopnea, PND, leg swelling. No hospitalizations or emergency room visits.    Review of Systems neg for any significant sore throat, dysphagia, itching, sneezing, nasal congestion or excess/ purulent secretions, fever, chills, sweats, unintended wt loss, pleuritic or exertional cp, hempoptysis, orthopnea pnd or change in chronic leg swelling. Also denies presyncope, palpitations, heartburn, abdominal pain, nausea, vomiting, diarrhea or change in bowel or urinary habits, dysuria,hematuria, rash, arthralgias, visual complaints, headache, numbness weakness or ataxia.     Objective:   Physical Exam  Gen. Pleasant, well-nourished, in no distress ENT - no lesions, no post nasal drip Neck: No JVD, no thyromegaly, no carotid bruits Lungs: no  use of accessory muscles, no dullness to percussion, clear without rales or rhonchi  Cardiovascular: Rhythm regular, heart sounds  normal, no murmurs or gallops, no peripheral edema Musculoskeletal: No deformities, no cyanosis or clubbing        Assessment & Plan:

## 2014-04-03 NOTE — Assessment & Plan Note (Signed)
Continue on AeroSpan 1 puffs daily  -brush/rinse/gargle after use.  ProAir HFA 2 puffs every 4-6 hours as needed. For wheezing or shortness of breath. This is a rescue inhaler. Followup with Dr. Elsworth Soho in 1 year.

## 2014-04-03 NOTE — Patient Instructions (Addendum)
Continue on AeroSpan 1 puffs daily  -brush/rinse/gargle after use.  ProAir HFA 2 puffs every 4-6 hours as needed. For wheezing or shortness of breath. This is a rescue inhaler. Followup with Dr. Elsworth Soho in 1 year.

## 2014-04-07 ENCOUNTER — Other Ambulatory Visit: Payer: Self-pay | Admitting: Neurology

## 2014-04-07 DIAGNOSIS — M79644 Pain in right finger(s): Secondary | ICD-10-CM | POA: Diagnosis not present

## 2014-04-07 DIAGNOSIS — S0512XA Contusion of eyeball and orbital tissues, left eye, initial encounter: Secondary | ICD-10-CM | POA: Diagnosis not present

## 2014-04-07 NOTE — Progress Notes (Signed)
Reviewed & agree with plan  

## 2014-04-15 ENCOUNTER — Ambulatory Visit (INDEPENDENT_AMBULATORY_CARE_PROVIDER_SITE_OTHER): Payer: Medicare Other | Admitting: Adult Health

## 2014-04-15 ENCOUNTER — Encounter: Payer: Self-pay | Admitting: Adult Health

## 2014-04-15 VITALS — BP 141/78 | HR 91 | Ht 64.0 in | Wt 170.0 lb

## 2014-04-15 DIAGNOSIS — G43809 Other migraine, not intractable, without status migrainosus: Secondary | ICD-10-CM

## 2014-04-15 DIAGNOSIS — R413 Other amnesia: Secondary | ICD-10-CM | POA: Diagnosis not present

## 2014-04-15 DIAGNOSIS — G250 Essential tremor: Secondary | ICD-10-CM | POA: Diagnosis not present

## 2014-04-15 NOTE — Progress Notes (Signed)
Choi: [name] DOB: [birth year]  REASON FOR VISIT: follow up- essential tremor, migraine, depression HISTORY FROM: Choi  HISTORY OF PRESENT ILLNESS: [name] is a 74 year old female with a history of essential tremor, migraine and depression. She returns today for follow-up. She is currently taking Methazolamide and reports that is working well for her tremor. Her tremor is primarily located in Rachel hands bilaterally. [name] states that her headaches have been controlled. She has approximately 8-9 takes per month. She states that she continues 15 mg of Imitrex and that is beneficial. She states that her headaches are not that severe. She states that her pain on average is 4/10. Her headaches are noramlly located on Rachel top of her head. [name] reports that her memory has stayed Rachel same. She states that she is under a lot of stress. She states that her husband will buy medication that is not prescribed to him such as xanax. She is able to complete all ADLs indepentdently.   [name] also has a history of depression and she reports that that it has not been controlled. She does see Dr. Robina Ade.  [name] states that she did have a fall on her patio. She did suffer bruising to Rachel left eye.  HISTORY 08/20/13 [name]): [name] is a 74 year old right-handed white female with a history of an essential tremor affecting Rachel voice, and upper extremities. She is on methazolamide 25 mg daily. She has a history of significant depression, and electroconvulsive therapy was contemplated previously. [name] said was placed on low-dose Synthroid, and this has significantly improved her energy level, depression, and her memory. [name] feels much better on this medication. [name] has had some headaches off and on, occurring 3 or 4 times a month. [name] indicates that 50 mg of Imitrex will help Rachel headache. [name] reports no other new medical issues that have come up  since last seen. Rachel tremor does impact her when she is trying to write. She is able to feed herself fairly well.  REVIEW OF SYSTEMS: Out of a complete 14 system review of symptoms, [name] complains only of Rachel following symptoms, and all other reviewed systems are negative.  Activity change, appetite change, fatigue, excessive sweating, hearing loss, ringing in ears, light sensitivity, blurred vision, shortness of breath, heat intolerance, flushing, swollen abdomen, abdominal pain, constipation, nausea, incontinence of bladder, frequency of urination, urgency, joint pain, back pain, joint swelling, aching muscles, walking difficulty, neck pain, neck stiffness, wounds, rash, moles, itching, incontinence of bladder, frequency of urination, urgency, environmental allergies, food allergies, memory loss, headache, weakness, tremors, agitation, depression, nervous/anxious    ALLERGIES: Allergies  Allergen Reactions  . Ciprofloxacin Nausea Only  . Erythromycin Other (See Comments)    Stomach burning  . Sulfa Antibiotics Nausea Only  . Latex Rash  . Shellfish Allergy Rash    HOME MEDICATIONS: Outpatient Prescriptions Prior to Visit  Medication Sig Dispense Refill  . albuterol (PROAIR HFA) 108 (90 BASE) MCG/ACT inhaler Inhale 2 puffs into Rachel lungs every 4 (four) hours as needed for wheezing or shortness of breath. 1 Inhaler 3  . Calcium Carbonate-Vitamin D (CALTRATE 600+D PO) Take 1 tablet by mouth daily.    Marland Kitchen CINNAMON PO Take 1 tablet by mouth daily.    . DULoxetine (CYMBALTA) 30 MG capsule Take 30 mg by mouth daily.    . Flunisolide HFA (AEROSPAN) 80 MCG/ACT AERS Inhale 80 mcg into Rachel lungs daily. 1 Inhaler  0  . HYDROcodone-acetaminophen (NORCO) 7.5-325 MG per tablet Take 1 tablet by mouth 2 (two) times daily. For pain    . levothyroxine (SYNTHROID, LEVOTHROID) 50 MCG tablet Once daily    . LORazepam (ATIVAN) 0.5 MG tablet Take by mouth at bedtime.     Marland Kitchen losartan (COZAAR) 100 MG tablet  Take 100 mg by mouth daily.    . methazolamide (NEPTAZANE) 25 MG tablet TAKE 1 TABLET BY MOUTH EVERY DAY 30 tablet 0  . Multiple Vitamins-Minerals (ICAPS PO) Take 1 tablet by mouth daily.    Marland Kitchen omeprazole (PRILOSEC) 20 MG capsule Take 1 capsule by mouth 2 (two) times daily before a meal.    . pregabalin (LYRICA) 50 MG capsule Take 50 mg by mouth 2 (two) times daily.    . SUMAtriptan (IMITREX) 50 MG tablet Take 50 mg by mouth every 2 (two) hours as needed. For migraine     No facility-administered medications prior to visit.    PAST MEDICAL HISTORY: Past Medical History  Diagnosis Date  . Fibromyalgia   . Migraines   . Hypertension   . Hyperlipidemia   . GERD (gastroesophageal reflux disease)   . Asthma   . Depression   . Diverticulosis   . Occasional tremors   . Chronic kidney disease     CKD Stage 3  . Essential tremor 05/17/2012  . Memory loss 05/17/2012  . Chronic renal insufficiency, stage III (moderate)     PAST SURGICAL HISTORY: Past Surgical History  Procedure Laterality Date  . Total hip arthroplasty      bilateral  . Appendectomy    . Tonsillectomy    . Abdominal hysterectomy    . Adenoidectomy    . Cataract extraction      FAMILY HISTORY: Family History  Problem Relation Age of Onset  . Stroke Maternal Grandmother   . Heart failure Maternal Grandfather   . Emphysema Maternal Grandfather   . Congestive Heart Failure Maternal Grandfather   . Coronary artery disease Paternal Grandmother   . Coronary artery disease Paternal Grandfather   . Liver cancer Mother   . Pulmonary embolism Mother   . Prostate cancer Father   . Prostate cancer Paternal Uncle   . ALS Paternal Aunt   . Brain cancer Paternal Aunt   . Breast cancer Paternal Aunt   . Lung cancer Maternal Aunt   . Depression Brother   . Diabetes      father side     PHYSICAL EXAM  Filed Vitals:   04/15/14 1420  BP: 141/78  Pulse: 91  Height: 5\' 4"  (1.626 m)  Weight: 170 lb (77.111 kg)   Body  mass index is 29.17 kg/(m^2).  Generalized: Well developed, in no acute distress   Neurological examination  Mentation: Alert oriented to time, place, history taking. Follows all commands speech and language fluent Cranial nerve II-XII: Pupils were equal round reactive to light. Extraocular movements were full, visual field were full on confrontational test. Facial sensation and strength were normal. Uvula tongue midline. Head turning and shoulder shrug  were normal and symmetric. Motor: Rachel motor testing reveals 5 over 5 strength of all 4 extremities. Good symmetric motor tone is noted throughout.  Sensory: Sensory testing is intact to soft touch on all 4 extremities. No evidence of extinction is noted.  Coordination: Cerebellar testing reveals good finger-nose-finger and heel-to-shin bilaterally.  Gait and station: Gait is normal. Tandem gait is slightly unsteady. Romberg is negative. No drift is seen.  Reflexes:  Deep tendon reflexes are symmetric and normal bilaterally.   DIAGNOSTIC DATA (LABS, IMAGING, TESTING) - I reviewed Choi records, labs, notes, testing and imaging myself where available.      ASSESSMENT AND PLAN 74 y.o. year old female  has a past medical history of Fibromyalgia; Migraines; Hypertension; Hyperlipidemia; GERD (gastroesophageal reflux disease); Asthma; Depression; Diverticulosis; Occasional tremors; Chronic kidney disease; Essential tremor (05/17/2012); Memory loss (05/17/2012); and Chronic renal insufficiency, stage III (moderate). here with:  1. Essential tremor 2. Migraines 3. Memory loss  [name]'s tremor has been controlled with methazolamide. [name] does have 8-9 headaches a month. She states they're very mild. She can use Imitrex and that usually resolves her headaches. Choi's memory has remained stable. Today her MMSE is 30 out of 30. We will continue to monitor her memory. Choi continues to have issues controlling her depression. She is  currently seeing Dr. Robina Ade and she is managing her depression. If [name]'s symptoms worsen or she develops new symptoms she'll let us know. Otherwise she will follow-up in 6 months or sooner if needed.     Ward Givens, MSN, NP-C 04/15/2014, 2:35 PM [facility] [address] [place], [place] [address] [phone]  Note: This document was prepared with digital dictation and possible smart phrase technology. Any transcriptional errors that result from this process are unintentional.

## 2014-04-15 NOTE — Patient Instructions (Signed)
Memory score is stable. Continue methazolamide for tremor.  If your symptoms worsen or you develop new symptoms let us know.

## 2014-04-15 NOTE — Progress Notes (Signed)
I have read the note, and I agree with the clinical assessment and plan.  WILLIS,CHARLES KEITH   

## 2014-04-22 DIAGNOSIS — M4696 Unspecified inflammatory spondylopathy, lumbar region: Secondary | ICD-10-CM | POA: Diagnosis not present

## 2014-04-22 DIAGNOSIS — Z6830 Body mass index (BMI) 30.0-30.9, adult: Secondary | ICD-10-CM | POA: Diagnosis not present

## 2014-04-22 DIAGNOSIS — M4317 Spondylolisthesis, lumbosacral region: Secondary | ICD-10-CM | POA: Diagnosis not present

## 2014-05-05 DIAGNOSIS — R102 Pelvic and perineal pain: Secondary | ICD-10-CM | POA: Diagnosis not present

## 2014-05-05 DIAGNOSIS — R140 Abdominal distension (gaseous): Secondary | ICD-10-CM | POA: Diagnosis not present

## 2014-05-11 ENCOUNTER — Other Ambulatory Visit: Payer: Self-pay | Admitting: Neurology

## 2014-05-19 ENCOUNTER — Other Ambulatory Visit: Payer: Self-pay | Admitting: Pulmonary Disease

## 2014-05-19 ENCOUNTER — Encounter: Payer: Self-pay | Admitting: Nurse Practitioner

## 2014-05-23 ENCOUNTER — Ambulatory Visit (HOSPITAL_COMMUNITY)
Admission: RE | Admit: 2014-05-23 | Discharge: 2014-05-23 | Disposition: A | Discharge status: Home / Self Care | Payer: Medicare Other | Source: Ambulatory Visit | Attending: Geriatric Medicine | Admitting: Geriatric Medicine

## 2014-05-23 DIAGNOSIS — Z1231 Encounter for screening mammogram for malignant neoplasm of breast: Secondary | ICD-10-CM | POA: Diagnosis not present

## 2014-06-05 ENCOUNTER — Encounter: Payer: Self-pay | Admitting: Nurse Practitioner

## 2014-06-05 ENCOUNTER — Ambulatory Visit (INDEPENDENT_AMBULATORY_CARE_PROVIDER_SITE_OTHER): Payer: Medicare Other | Admitting: Nurse Practitioner

## 2014-06-05 VITALS — BP 112/78 | HR 88 | Ht 63.75 in | Wt 174.1 lb

## 2014-06-05 DIAGNOSIS — K5900 Constipation, unspecified: Secondary | ICD-10-CM | POA: Insufficient documentation

## 2014-06-05 DIAGNOSIS — R110 Nausea: Secondary | ICD-10-CM

## 2014-06-05 DIAGNOSIS — K5901 Slow transit constipation: Secondary | ICD-10-CM

## 2014-06-05 DIAGNOSIS — R1084 Generalized abdominal pain: Secondary | ICD-10-CM | POA: Diagnosis not present

## 2014-06-05 MED ORDER — LINACLOTIDE 290 MCG PO CAPS: 290.0000 ug | ORAL_CAPSULE | Freq: Every day | ORAL | Status: DC

## 2014-06-05 MED ORDER — HYOSCYAMINE SULFATE 0.125 MG SL SUBL: 0.1250 mg | SUBLINGUAL_TABLET | Freq: Four times a day (QID) | SUBLINGUAL | Status: DC | PRN

## 2014-06-05 NOTE — Patient Instructions (Addendum)
We scheduled a Gastric Emptying Scan for Friday 06-20-2014 at [facility]. Call [phone] is you ned to reschedule. Have no medications 24 hours prior to the test. Have nothing by mouth 6 hours prior to the test. Arrive at 9:00 am .    We sent prescriptions to [facility] [place], [place]. 1. Linzess 290 mcg. For constipation. 2. Levsin Sublingual for abdominal pain, cramps, spasms.  Call our office if the Linzess is too expensive. When you cal,l say you saw Tye Savoy NP in the office.  _____________________________________________________________________ A gastric-emptying study measures how long it takes for food to move through your stomach. There are several ways to measure stomach emptying. In the most common test, you eat food that contains a small amount of radioactive material. A scanner that detects the movement of the radioactive material is placed over your abdomen to monitor the rate at which food leaves your stomach. This test normally takes about 2 hours to complete. _____________________________________________________________________

## 2014-06-06 ENCOUNTER — Encounter: Payer: Self-pay | Admitting: Gastroenterology

## 2014-06-08 ENCOUNTER — Encounter: Payer: Self-pay | Admitting: Nurse Practitioner

## 2014-06-08 NOTE — Progress Notes (Signed)
HPI :   Patient is a 74 year old female known remotely to Dr. Olevia Perches. Patient was seen here several years ago for evaluaion of dysphagia and to discuss surveillance colonoscopy for a history of adenomatous polyps. Patient decided to have procedures done with [facility] because her husband went there and their prep seemed less complicated. EGD for dysphagia at [facility] July 2012 was normal. Surveillance colonoscopy October 2012 was also normal.    Patient comes in today with multiple GI issues. She complains of postprandial bloating, early morning nausea, diffuse abdominal pain at night, and bowel changes. After meals patient feels like food sitting in epigastric area. Morning time nausea present for 4-5 years. Recent CTscan unrevealing except for suggestion of constipation. 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 Takes Miralax daily but BMs still erratic. Patient has been told her motility is sluggish. She gives a history of depression with associated cognitive deficiencies.   Past Medical History  Diagnosis Date  . Fibromyalgia   . Migraines   . Hypertension   . Hyperlipidemia   . GERD (gastroesophageal reflux disease)   . Asthma   . Depression   . Diverticulosis   . Occasional tremors   . Chronic kidney disease     CKD Stage 3  . Essential tremor 05/17/2012  . Memory loss 05/17/2012  . Chronic renal insufficiency, stage III (moderate)   . Anxiety   . Arthritis   . Pneumonia   . ADD (attention deficit disorder)     Family History  Problem Relation Age of Onset  . Stroke Maternal Grandmother   . Heart failure Maternal Grandfather   . Emphysema Maternal Grandfather   . Congestive Heart Failure Maternal Grandfather   . Coronary artery disease Paternal Grandmother   . Coronary artery disease Paternal Grandfather   . Liver cancer Mother   . Pulmonary embolism Mother   . Prostate cancer Father   . Prostate cancer Paternal Uncle     x 2  . ALS Paternal Aunt   . Brain cancer Paternal Aunt    . Breast cancer Paternal Aunt   . Lung cancer Maternal Aunt   . Depression Brother     suicide  . Diabetes Paternal Aunt     x 2   History  Substance Use Topics  . Smoking status: Never Smoker   . Smokeless tobacco: Never Used  . Alcohol Use: No   Current Outpatient Prescriptions  Medication Sig Dispense Refill  . AEROSPAN 80 MCG/ACT AERS INHALE TWO PUFFS TWICE DAILY 8.9 g 10  . albuterol (PROAIR HFA) 108 (90 BASE) MCG/ACT inhaler Inhale 2 puffs into the lungs every 4 (four) hours as needed for wheezing or shortness of breath. 1 Inhaler 3  . Calcium Carbonate-Vitamin D (CALTRATE 600+D PO) Take 1 tablet by mouth daily.    Marland Kitchen CINNAMON PO Take 1 tablet by mouth daily.    . DULoxetine (CYMBALTA) 30 MG capsule Take 30 mg by mouth daily.    . Flunisolide HFA (AEROSPAN) 80 MCG/ACT AERS Inhale 80 mcg into the lungs daily. 1 Inhaler 0  . HYDROcodone-acetaminophen (NORCO) 7.5-325 MG per tablet Take 1 tablet by mouth 2 (two) times daily. For pain    . levothyroxine (SYNTHROID, LEVOTHROID) 50 MCG tablet Once daily    . LORazepam (ATIVAN) 1 MG tablet Take 1 tablet by mouth at bedtime.    Marland Kitchen losartan (COZAAR) 100 MG tablet Take 100 mg by mouth daily.    . methazolamide (NEPTAZANE) 25 MG tablet TAKE  1 TABLET BY MOUTH EVERY DAY 30 tablet 6  . Multiple Vitamins-Minerals (ICAPS PO) Take 1 tablet by mouth daily.    Marland Kitchen omeprazole (PRILOSEC) 20 MG capsule Take 1 capsule by mouth 2 (two) times daily before a meal.    . polyethylene glycol powder (GLYCOLAX/MIRALAX) powder Take 17 g by mouth as needed.    . pregabalin (LYRICA) 50 MG capsule Take 50 mg by mouth 2 (two) times daily.    . SUMAtriptan (IMITREX) 50 MG tablet Take 50 mg by mouth every 2 (two) hours as needed. For migraine    . Vortioxetine HBr 5 MG TABS Take 1 tablet by mouth daily.    . hyoscyamine (LEVSIN/SL) 0.125 MG SL tablet Place 1 tablet (0.125 mg total) under the tongue every 6 (six) hours as needed. 30 tablet 0  . Linaclotide (LINZESS)  290 MCG CAPS capsule Take 1 capsule (290 mcg total) by mouth daily. 30 capsule 1   No current facility-administered medications for this visit.   Allergies  Allergen Reactions  . Ciprofloxacin Nausea Only  . Erythromycin Other (See Comments)    Stomach burning  . Sulfa Antibiotics Nausea Only  . Latex Rash  . Shellfish Allergy Rash     Review of Systems: Multiple positives: allergy/sinus, anxiety, arthriitis, vision changes, confusion, depression, fatigue, hearing problems, heart rhythm changes, muscle pain, night sweats, shortness of breath, skin rash, excessive urination, urine leakage. All other systems reviewed and negative except where noted in HPI.     Physical Exam: BP 112/78 mmHg  Pulse 88  Ht 5' 3.75" (1.619 m)  Wt 174 lb 2 oz (78.983 kg)  BMI 30.13 kg/m2 Constitutional: Pleasant,well-developed, white female in no acute distress. HEENT: Normocephalic and atraumatic. Conjunctivae are normal. No scleral icterus. Neck supple.  Cardiovascular: Normal rate, regular rhythm.  Pulmonary/chest: Effort normal and breath sounds normal. No wheezing, rales or rhonchi. Abdominal: Soft, nondistended, nontender. Bowel sounds active throughout. There are no masses palpable. No hepatomegaly. Extremities: no edema Lymphadenopathy: No cervical adenopathy noted. Neurological: Alert and oriented to person place and time. Skin: Skin is warm and dry. No rashes noted. Psychiatric: Normal mood and affect. Behavior is normal.   ASSESSMENT AND PLAN:  4. 74 year old female with multiple GI complaints. She has bloating, constipation, and early am nausea, feels like food sits in stomach. Patient has been told she has sluggish motility but really wants further explanation / evaluation. Suspect her symptoms are functional in nature, home medications probably contributing factors. Normal EGD 2012 ([facility]). Normal CTscan other than constipation earlier this month.   Will obtain gastric emptying  study, off Norco.  She complains of constipation, unhappy with Miralax. Will try Linzess  Levsin SL QHS prn cramps  Will try Linzess 242mcg daily since she is not pleased with Miralax  Further recommendations pending above.    2. Multiple medical problems not limited to depression / anxiety /fibromyalgia / thyroid disease / and CKD  3.

## 2014-06-09 NOTE — Progress Notes (Signed)
Reviewed including my last OV from 12/2009, nothing much has changed in her symptomatology. Please obtain OV notes from [place] if there are any. I need to see them before seeing her again.

## 2014-06-10 DIAGNOSIS — F3341 Major depressive disorder, recurrent, in partial remission: Secondary | ICD-10-CM | POA: Diagnosis not present

## 2014-06-10 DIAGNOSIS — Z0000 Encounter for general adult medical examination without abnormal findings: Secondary | ICD-10-CM | POA: Diagnosis not present

## 2014-06-10 DIAGNOSIS — I129 Hypertensive chronic kidney disease with stage 1 through stage 4 chronic kidney disease, or unspecified chronic kidney disease: Secondary | ICD-10-CM | POA: Diagnosis not present

## 2014-06-10 DIAGNOSIS — R21 Rash and other nonspecific skin eruption: Secondary | ICD-10-CM | POA: Diagnosis not present

## 2014-06-10 DIAGNOSIS — N183 Chronic kidney disease, stage 3 (moderate): Secondary | ICD-10-CM | POA: Diagnosis not present

## 2014-06-12 DIAGNOSIS — L239 Allergic contact dermatitis, unspecified cause: Secondary | ICD-10-CM | POA: Diagnosis not present

## 2014-06-16 ENCOUNTER — Telehealth: Payer: Self-pay | Admitting: Nurse Practitioner

## 2014-06-16 NOTE — Telephone Encounter (Signed)
Patient had questions about taking her linzess.  She is asking if ok to take later in the day.  She is advised that she can take it in the late afternoon if that is her preference.  She said that she was not to take it with her synthroid, Dr. Felipa Eth made the suggestion to take linzess in the evening.  She will call back for any additional questions or concerns

## 2014-06-20 ENCOUNTER — Encounter (HOSPITAL_COMMUNITY)
Admission: RE | Admit: 2014-06-20 | Discharge: 2014-06-20 | Disposition: A | Discharge status: Home / Self Care | Payer: Medicare Other | Source: Ambulatory Visit | Attending: Physician Assistant | Admitting: Physician Assistant

## 2014-06-20 DIAGNOSIS — R1084 Generalized abdominal pain: Secondary | ICD-10-CM | POA: Diagnosis not present

## 2014-06-20 DIAGNOSIS — R110 Nausea: Secondary | ICD-10-CM | POA: Diagnosis not present

## 2014-06-20 DIAGNOSIS — R109 Unspecified abdominal pain: Secondary | ICD-10-CM | POA: Diagnosis not present

## 2014-06-20 DIAGNOSIS — K5901 Slow transit constipation: Secondary | ICD-10-CM | POA: Diagnosis not present

## 2014-06-20 MED ORDER — TECHNETIUM TC 99M SULFUR COLLOID
2.0000 | Freq: Once | INTRAVENOUS | Status: AC | PRN
  Administered 2014-06-20: 2 via INTRAVENOUS

## 2014-06-27 ENCOUNTER — Telehealth: Payer: Self-pay | Admitting: Nurse Practitioner

## 2014-06-27 NOTE — Telephone Encounter (Signed)
Patient reports that she had  Terrible diarrhea with 290 mcg of linzess.  She took for the first time last night.  Nevin Bloodgood do you want to send her the lower dose.  She has a follow up with Dr. Olevia Perches on 07/07/14. Please advise

## 2014-06-30 NOTE — Telephone Encounter (Signed)
[name], technically the higher dose shouldn't cause any more loose stools then the lower dose. The higher dose just helps with pain. Please make sure she takes it with food. Can cut it back to every other day. Thanks

## 2014-06-30 NOTE — Telephone Encounter (Signed)
Pt states she was having a lot of abdominal pain and bloating. States she would like to try a lower dose of the med, 142mcg. Please advise.

## 2014-06-30 NOTE — Telephone Encounter (Signed)
Left message for pt to call back  °

## 2014-07-02 NOTE — Telephone Encounter (Signed)
Patient would like to try something different than linzess.  She says that linzess causes pain.  Please advise

## 2014-07-03 MED ORDER — LUBIPROSTONE 8 MCG PO CAPS: 8.0000 ug | ORAL_CAPSULE | Freq: Two times a day (BID) | ORAL | Status: DC

## 2014-07-03 NOTE — Telephone Encounter (Signed)
Patient notified rx sent 

## 2014-07-03 NOTE — Telephone Encounter (Signed)
Okay, she can try the 14mcg of amitiza bid

## 2014-07-11 ENCOUNTER — Encounter: Payer: Self-pay | Admitting: Internal Medicine

## 2014-07-11 ENCOUNTER — Ambulatory Visit (INDEPENDENT_AMBULATORY_CARE_PROVIDER_SITE_OTHER): Payer: Medicare Other | Admitting: Internal Medicine

## 2014-07-11 ENCOUNTER — Telehealth: Payer: Self-pay

## 2014-07-11 VITALS — BP 118/66 | HR 84 | Ht 63.75 in | Wt 173.2 lb

## 2014-07-11 DIAGNOSIS — R1084 Generalized abdominal pain: Secondary | ICD-10-CM | POA: Diagnosis not present

## 2014-07-11 DIAGNOSIS — R1013 Epigastric pain: Secondary | ICD-10-CM | POA: Diagnosis not present

## 2014-07-11 MED ORDER — LUBIPROSTONE 8 MCG PO CAPS: ORAL_CAPSULE | ORAL | Status: DC

## 2014-07-11 NOTE — Progress Notes (Signed)
[name] 1940/03/06 [id number]  Note: This dictation was prepared with Dragon digital system. Any transcriptional errors that result from this procedure are unintentional.   History of Present Illness: This is a 74 year old white female with irritable bowel syndrome. Dyspepsia. Postprandial abdominal pain and alternating constipation with occasional diarrhea. She was seen in April 2016 and started on Linzess. She could not tolerate the side effects being mostly crampy abdominal pain. She was then switched to Amitiza 8 g twice a day which seems to be somewhat effective but patient feels she needs a stronger dose. She has been under great deal of stress. She reports her husband abusing prescription medications. She has fibromyalgia. He has always had the severe constipation. She saw Dr. Timmothy Euler in the past and subsequently Dr.Bucciini who did upper endoscopy and colonoscopy in July 2012 both exams were normal. Most recent gastric emptying scan in May 2016 was normal with 9% retention in 120 minutes. She has a strong family history of gallbladder disease in her mother and both grandmothers. Her ultrasound in February 2014 was normal. Patient wakes up at 4 in the morning and takes 2 Pepto-Bismol which seemed to settle down her stomach    Past Medical History  Diagnosis Date  . Fibromyalgia   . Migraines   . Hypertension   . Hyperlipidemia   . GERD (gastroesophageal reflux disease)   . Asthma   . Depression   . Diverticulosis   . Occasional tremors   . Chronic kidney disease     CKD Stage 3  . Essential tremor 05/17/2012  . Memory loss 05/17/2012  . Chronic renal insufficiency, stage III (moderate)   . Anxiety   . Arthritis   . Pneumonia   . ADD (attention deficit disorder)     Past Surgical History  Procedure Laterality Date  . Total hip arthroplasty Bilateral 2009  . Appendectomy    . Tonsillectomy and adenoidectomy      age 5  . Abdominal hysterectomy    . Cataract  extraction Bilateral     Allergies  Allergen Reactions  . Ciprofloxacin Nausea Only  . Erythromycin Other (See Comments)    Stomach burning  . Sulfa Antibiotics Nausea Only  . Latex Rash  . Shellfish Allergy Rash    Family history and social history have been reviewed.  Review of Systems: Dyspepsia, nausea. She bowel habits. Denies rectal bleeding  The remainder of the 10 point ROS is negative except as outlined in the H&P  Physical Exam: General Appearance Well developed, in no distress Eyes  Non icteric  HEENT  Non traumatic, normocephalic  Mouth No lesion, tongue papillated, no cheilosis Neck Supple without adenopathy, thyroid not enlarged, no carotid bruits, no JVD Lungs Clear to auscultation bilaterally COR Normal S1, normal S2, regular rhythm, no murmur, quiet precordium Abdomen soft with normoactive bowel sounds. No distention. No bruit no palpable mass  Rectal soft Hemoccult-negative stool Extremities  No pedal edema Skin No lesions Neurological Alert and oriented x 3 Psychological Normal mood and affect  Assessment and Plan:   74 year old white female with the irritable bowel syndrome with alternating diarrhea and constipation but predominant constipation. She has a strong family history of gallbladder disease and we will go ahead with HIDA scan to assess gallbladder function. She will continue on Amitiza 16 ug in the morning and 8 ug at bedtime. If not effective, she will begin mineral oil 1 tablespoon daily. If dyspepsia continuous and her gallbladder study is negative I will consider  repeating upper endoscopy. I wonder how big role is stress playing in her  IBS.    Delfin Edis 07/11/2014

## 2014-07-11 NOTE — Patient Instructions (Addendum)
We have sent the following medications to your pharmacy for you to pick up at your convenience: Amitiza.   You can take mineral oil one tablespoon daily to also help with constipation.   You have been scheduled for a HIDA scan at [facility] (1st floor) on 07/28/14. Please arrive 15 minutes prior to your scheduled appointment at  6:22WL. Make certain not to have anything to eat or drink at least 6 hours prior to your test. Should this appointment date or time not work well for you, please call radiology scheduling at [phone].  _____________________________________________________________________ hepatobiliary (HIDA) scan is an imaging procedure used to diagnose problems in the liver, gallbladder and bile ducts. In the HIDA scan, a radioactive chemical or tracer is injected into a vein in your arm. The tracer is handled by the liver like bile. Bile is a fluid produced and excreted by your liver that helps your digestive system break down fats in the foods you eat. Bile is stored in your gallbladder and the gallbladder releases the bile when you eat a meal. A special nuclear medicine scanner (gamma camera) tracks the flow of the tracer from your liver into your gallbladder and small intestine.  During your HIDA scan  You'll be asked to change into a hospital gown before your HIDA scan begins. Your health care team will position you on a table, usually on your back. The radioactive tracer is then injected into a vein in your arm.The tracer travels through your bloodstream to your liver, where it's taken up by the bile-producing cells. The radioactive tracer travels with the bile from your liver into your gallbladder and through your bile ducts to your small intestine.You may feel some pressure while the radioactive tracer is injected into your vein. As you lie on the table, a special gamma camera is positioned over your abdomen taking pictures of the tracer as it moves through your body. The gamma  camera takes pictures continually for about an hour. You'll need to keep still during the HIDA scan. This can become uncomfortable, but you may find that you can lessen the discomfort by taking deep breaths and thinking about other things. Tell your health care team if you're uncomfortable. The radiologist will watch on a computer the progress of the radioactive tracer through your body. The HIDA scan may be stopped when the radioactive tracer is seen in the gallbladder and enters your small intestine. This typically takes about an hour. In some cases extra imaging will be performed if original images aren't satisfactory, if morphine is given to help visualize the gallbladder or if the medication CCK is given to look at the contraction of the gallbladder. This test typically takes 2 hours to complete. ________________________________________________________________________    cc: Dr Felipa Eth

## 2014-07-11 NOTE — Telephone Encounter (Signed)
Called [employer] to do prior authorization for Amitiza, currently under review.

## 2014-07-15 ENCOUNTER — Telehealth: Payer: Self-pay | Admitting: *Deleted

## 2014-07-15 MED ORDER — LUBIPROSTONE 8 MCG PO CAPS: ORAL_CAPSULE | ORAL | Status: DC

## 2014-07-15 NOTE — Telephone Encounter (Signed)
Called patient at [phone] [facility] #) to let them know their Rx was approved and sent to pharmacy for them to pickup. Patient stated they understood.

## 2014-07-15 NOTE — Telephone Encounter (Signed)
Prior Authorization was approved through [employer] for Amitiza, 8 mcg, to take 3 capsules per day. Approval is good through 02/14/2015. [id number]. Re-sent Rx for Amitiza to pharmacy with a note Rx was approved.

## 2014-07-21 ENCOUNTER — Telehealth: Payer: Self-pay | Admitting: Internal Medicine

## 2014-07-21 NOTE — Telephone Encounter (Signed)
Patient calling because over the weekend she woke up with knife like pain under her left ribs and thought something was going to rupture. She thinks it was due to the Paint. She states Linzess did this to her also. The pain went away after she took Pepto bismol and drank ginger ale. She is only taking one Amitiza in AM and one in PM. She has had 2 small bowel movements. She just started the mineral oil last night.  She states she will keep taking the Amitiza if Dr. Olevia Perches does not think it will "rupture something." Please, advise.

## 2014-07-28 ENCOUNTER — Ambulatory Visit (HOSPITAL_COMMUNITY)
Admission: RE | Admit: 2014-07-28 | Discharge: 2014-07-28 | Disposition: A | Discharge status: Home / Self Care | Payer: Medicare Other | Source: Ambulatory Visit | Attending: Internal Medicine | Admitting: Internal Medicine

## 2014-07-28 DIAGNOSIS — R109 Unspecified abdominal pain: Secondary | ICD-10-CM | POA: Diagnosis not present

## 2014-07-28 DIAGNOSIS — R1084 Generalized abdominal pain: Secondary | ICD-10-CM

## 2014-07-28 DIAGNOSIS — R110 Nausea: Secondary | ICD-10-CM | POA: Diagnosis not present

## 2014-07-28 DIAGNOSIS — R1013 Epigastric pain: Secondary | ICD-10-CM

## 2014-07-28 MED ORDER — TECHNETIUM TC 99M MEBROFENIN IV KIT: 5.3000 | PACK | Freq: Once | INTRAVENOUS | Status: AC | PRN

## 2014-08-01 ENCOUNTER — Other Ambulatory Visit: Payer: Self-pay | Admitting: Adult Health

## 2014-08-25 ENCOUNTER — Telehealth: Payer: Self-pay | Admitting: Internal Medicine

## 2014-08-25 MED ORDER — LUBIPROSTONE 24 MCG PO CAPS: 24.0000 ug | ORAL_CAPSULE | Freq: Two times a day (BID) | ORAL | Status: DC

## 2014-08-25 NOTE — Telephone Encounter (Signed)
Left a message for patient to call back. 

## 2014-08-25 NOTE — Telephone Encounter (Signed)
Please increase Amitiza to 24 ug bid,#60, 3 refills.

## 2014-08-25 NOTE — Telephone Encounter (Signed)
Patient states her stomach is sluggish again. States the Amitiza 8 mg three tablets and mineral oil helped at first but is not helping her have good bowel movements. She is asking if she should try the 24 mcg tablet or try Linzess again. Please, advise.

## 2014-08-25 NOTE — Telephone Encounter (Signed)
See previous phone note.  

## 2014-08-25 NOTE — Telephone Encounter (Signed)
Rx sent to pharmacy. Patient aware. 

## 2014-08-28 ENCOUNTER — Telehealth: Payer: Self-pay | Admitting: Pulmonary Disease

## 2014-08-28 ENCOUNTER — Telehealth: Payer: Self-pay | Admitting: Internal Medicine

## 2014-08-28 NOTE — Telephone Encounter (Signed)
Spoke with patient and she states she woke up last night at 4:00 AM with nausea, SOB and itching. She took some Pepto bismol. She states she has kidney disease and thinks this may be her problem since she is itching but wanted to let Dr. Olevia Perches know about it too. Suggested patient call her PCP since she had SOB when in bed and itching. She will do this.

## 2014-08-28 NOTE — Telephone Encounter (Signed)
Called spoke with pt. She is needing to schedule an appt but is awaiting a call from her PCP about an appt with them. She reports when she hears back from them she will call us to schedule her appt. Will sign off

## 2014-08-28 NOTE — Telephone Encounter (Signed)
If she is itching I would check  LFT's. Her HIDA is normal.  Amitiza and Hydrocodone on her medication list can cause itching.Please call to see if she called her PCP, if not, obtain LFT's and CBC with diff ( eos count) in our office.

## 2014-08-29 DIAGNOSIS — Z79899 Other long term (current) drug therapy: Secondary | ICD-10-CM | POA: Diagnosis not present

## 2014-08-29 NOTE — Telephone Encounter (Signed)
Spoke with patient and she is going to haveblood drawn at her PCP today.

## 2014-08-29 NOTE — Telephone Encounter (Signed)
Left a message for patient to call back. 

## 2014-09-12 DIAGNOSIS — I10 Essential (primary) hypertension: Secondary | ICD-10-CM | POA: Diagnosis not present

## 2014-09-12 DIAGNOSIS — R21 Rash and other nonspecific skin eruption: Secondary | ICD-10-CM | POA: Diagnosis not present

## 2014-09-12 DIAGNOSIS — K219 Gastro-esophageal reflux disease without esophagitis: Secondary | ICD-10-CM | POA: Diagnosis not present

## 2014-09-16 ENCOUNTER — Telehealth: Payer: Self-pay | Admitting: Internal Medicine

## 2014-09-16 NOTE — Telephone Encounter (Signed)
Patient is having problems with Amitiza. States she gets headaches with it. She also states she is having problems with constipation then diarrhea. Scheduled with Dr. Olevia Perches on 09/23/14 at 11:00 AM.

## 2014-09-17 ENCOUNTER — Telehealth: Payer: Self-pay | Admitting: Internal Medicine

## 2014-09-17 NOTE — Telephone Encounter (Signed)
Until she sees me, try Mineral oil, 1 teaspoon daily.

## 2014-09-18 NOTE — Telephone Encounter (Signed)
Patient given recommendations. 

## 2014-09-18 NOTE — Telephone Encounter (Signed)
Left a message for patient to call back. 

## 2014-09-19 DIAGNOSIS — L309 Dermatitis, unspecified: Secondary | ICD-10-CM | POA: Diagnosis not present

## 2014-09-24 ENCOUNTER — Encounter: Payer: Self-pay | Admitting: Internal Medicine

## 2014-09-24 ENCOUNTER — Ambulatory Visit (INDEPENDENT_AMBULATORY_CARE_PROVIDER_SITE_OTHER): Payer: Medicare Other | Admitting: Internal Medicine

## 2014-09-24 VITALS — BP 104/76 | HR 72 | Ht 63.75 in | Wt 169.0 lb

## 2014-09-24 DIAGNOSIS — K5901 Slow transit constipation: Secondary | ICD-10-CM | POA: Diagnosis not present

## 2014-09-24 DIAGNOSIS — R1013 Epigastric pain: Secondary | ICD-10-CM

## 2014-09-24 DIAGNOSIS — K589 Irritable bowel syndrome without diarrhea: Secondary | ICD-10-CM

## 2014-09-24 NOTE — Progress Notes (Signed)
[name] Aug 24, 1940 [id number]  Note: This dictation was prepared with Dragon digital system. Any transcriptional errors that result from this procedure are unintentional.   History of Present Illness: This is a 74 year old white female with irritable bowel syndrome with predominant constipation. She has been having difficulty in regulating her bowel habits. She switched from Dr. Cristina Gong to [place] hoping be given  more satisfactory bowel regimen.She was on Amitiza 8 g twice a day increased to 24 g twice a day. After a few months she didn't think it was effective and we switch her to [place] which apparently caused severe cramps and diarrhea. She was supposed to take MiraLAX 17 g every morning but she reported no effect. She used to take milk of magnesia 3 tablespoons at bedtime. She is complaining also abdominal distention and difficulty breathing after an evening meal associated with bloating and abdominal distention. She takes Gas-X for it. HIDA scan in last 2 weeks was normal with ejection fraction of 80%. Gastric emptying scan was also normal with 91% emptying in 2 hours. Last upper endoscopy by Dr. Alycia Rossetti in July 2012 was normal. Colonoscopy in July 2012 f was also normal. Prior colonoscopy in 2006. She denies rectal bleeding, weight loss. Nausea or vomiting. She called [place] last week complaining of constipation and we have given her a trial of mineral oil 1 tablespoon daily which seemed to be working    Past Medical History  Diagnosis Date  . Fibromyalgia   . Migraines   . Hypertension   . Hyperlipidemia   . GERD (gastroesophageal reflux disease)   . Asthma   . Depression   . Diverticulosis   . Occasional tremors   . Chronic kidney disease     CKD Stage 3  . Essential tremor 05/17/2012  . Memory loss 05/17/2012  . Chronic renal insufficiency, stage III (moderate)   . Anxiety   . Arthritis   . Pneumonia   . ADD (attention deficit disorder)     Past Surgical History  Procedure  Laterality Date  . Total hip arthroplasty Bilateral 2009  . Appendectomy    . Tonsillectomy and adenoidectomy      age 64  . Abdominal hysterectomy    . Cataract extraction Bilateral     Allergies  Allergen Reactions  . Ciprofloxacin Nausea Only  . Erythromycin Other (See Comments)    Stomach burning  . Sulfa Antibiotics Nausea Only  . Latex Rash  . Shellfish Allergy Rash    Family history and social history have been reviewed.  Review of Systems: Abdominal distention, bloating, constipation  The remainder of the 10 point ROS is negative except as outlined in the H&P  Physical Exam: General Appearance Well developed, in no distress Eyes  Non icteric  HEENT  Non traumatic, normocephalic  Mouth No lesion, tongue papillated, no cheilosis Neck Supple without adenopathy, thyroid not enlarged, no carotid bruits, no JVD Lungs Clear to auscultation bilaterally COR Normal S1, normal S2, regular rhythm, no murmur, quiet precordium Abdomen soft with minimal tenderness in epigastrium. Normal active bowel sounds. No tympany. Rectal not done Extremities  No pedal edema Skin No lesions Neurological Alert and oriented x 3 Psychological Normal mood and affect  Assessment and Plan:   74 year old white female with irritable bowel syndrome, bloating and irregular bowel habits with predominant constipation. She is intolerant to multiple laxatives and promotility agents. We have discussed at length  different options.. I recommended MiraLAX 17 g every morning with additional milk of magnesia  45 cc at night. Another regimen would be mineral oil 1 tablespoon in the morning with additional milk of magnesia at night. I also  instructed her in  regulating the dose of individual laxative to be titrated against her bowel habits. She needs to learn when it's appropriate to increase the laxative and when hold it in order to avoid  diarrhea. She needs to continue high-fiber diet. She will follow up with  Dr.Nang[place]am     Delfin Edis 09/24/2014

## 2014-09-24 NOTE — Patient Instructions (Addendum)
Use either Mineral Oil one tablespoon every AM or Miralax 17grams every AM.  Use Milk of Magnesium one capful at bedtime daily.   Use gas x or gaviscon every 2 hours as needed for gas.  Use Levsin every 4 hours as needed for gas.  Follow up with Dr Harl Bowie , she comes in October 2016.  I appreciate the opportunity to care for you.  Dr Felipa Eth

## 2014-10-03 ENCOUNTER — Ambulatory Visit (INDEPENDENT_AMBULATORY_CARE_PROVIDER_SITE_OTHER): Payer: Medicare Other | Admitting: Adult Health

## 2014-10-03 ENCOUNTER — Encounter: Payer: Self-pay | Admitting: Adult Health

## 2014-10-03 VITALS — BP 126/88 | HR 55 | Temp 97.7°F | Ht 64.0 in | Wt 167.0 lb

## 2014-10-03 DIAGNOSIS — J4521 Mild intermittent asthma with (acute) exacerbation: Secondary | ICD-10-CM

## 2014-10-03 MED ORDER — FLUNISOLIDE HFA 80 MCG/ACT IN AERS: 2.0000 | INHALATION_SPRAY | Freq: Two times a day (BID) | RESPIRATORY_TRACT | Status: AC

## 2014-10-03 NOTE — Addendum Note (Signed)
Addended by: Osa Craver on: 10/03/2014 03:58 PM   Modules accepted: Orders

## 2014-10-03 NOTE — Progress Notes (Signed)
   Subjective:    Patient ID: [name], female    DOB: 1941-01-19, 74 y.o.   MRN: [id number]  HPI  74 years old never smoker for FU of asthma She has multiple medical problems including movement disorder, hypertension dyslipidemia , asthma, depression and fibromyalgia Initial pulmonary consult 05/24/12    06/26/12 PFT today showed Nml FEV1 at 114% , min drop in ratio at 63, Mild obstruction in mid flows at 58% w/ 20% BD response.       10/03/2014 Follow up Asthma  Patient returns for six-month follow-up for asthma Overall says her asthma has not been as good lately .  More wheezing and dyspnea over last few months  She remains on Aerospan, using 1 puff daily  Patient denies any chest pain, orthopnea, PND, leg swelling, cough or discolored mucus.  No hospitalizations or emergency room visits.    Review of Systems neg for any significant sore throat, dysphagia, itching, sneezing, nasal congestion or excess/ purulent secretions, fever, chills, sweats, unintended wt loss, pleuritic or exertional cp, hempoptysis, orthopnea pnd or change in chronic leg swelling. Also denies presyncope, palpitations, heartburn, abdominal pain, nausea, vomiting, diarrhea or change in bowel or urinary habits, dysuria,hematuria, rash, arthralgias, visual complaints, headache, numbness weakness or ataxia.     Objective:   Physical Exam  Gen. Pleasant, well-nourished, in no distress ENT - no lesions, no post nasal drip Neck: No JVD, no thyromegaly, no carotid bruits Lungs: no use of accessory muscles, no dullness to percussion, clear without rales or rhonchi  Cardiovascular: Rhythm regular, heart sounds  normal, no murmurs or gallops, no peripheral edema Musculoskeletal: No deformities, no cyanosis or clubbing        Assessment & Plan:

## 2014-10-03 NOTE — Patient Instructions (Signed)
Increase  on AeroSpan 2 puffs Twice daily   -brush/rinse/gargle after use.  ProAir HFA 2 puffs every 4-6 hours as needed. For wheezing or shortness of breath. This is a rescue inhaler. Followup with Dr. Elsworth Soho in 4 months and As needed

## 2014-10-03 NOTE — Assessment & Plan Note (Signed)
Mild flare   Plan  Increase  on AeroSpan 2 puffs Twice daily   -brush/rinse/gargle after use.  ProAir HFA 2 puffs every 4-6 hours as needed. For wheezing or shortness of breath. This is a rescue inhaler. Followup with Dr. Elsworth Soho in 4 months and As needed

## 2014-10-06 NOTE — Progress Notes (Signed)
Reviewed & agree with plan  

## 2014-10-07 DIAGNOSIS — E559 Vitamin D deficiency, unspecified: Secondary | ICD-10-CM | POA: Diagnosis not present

## 2014-10-07 DIAGNOSIS — F329 Major depressive disorder, single episode, unspecified: Secondary | ICD-10-CM | POA: Diagnosis not present

## 2014-10-07 DIAGNOSIS — R350 Frequency of micturition: Secondary | ICD-10-CM | POA: Diagnosis not present

## 2014-10-07 DIAGNOSIS — I10 Essential (primary) hypertension: Secondary | ICD-10-CM | POA: Diagnosis not present

## 2014-10-08 DIAGNOSIS — R350 Frequency of micturition: Secondary | ICD-10-CM | POA: Diagnosis not present

## 2014-10-15 DIAGNOSIS — R197 Diarrhea, unspecified: Secondary | ICD-10-CM | POA: Diagnosis not present

## 2014-10-16 ENCOUNTER — Ambulatory Visit: Payer: Medicare Other | Admitting: Adult Health

## 2014-10-21 ENCOUNTER — Encounter: Payer: Self-pay | Admitting: Adult Health

## 2014-10-21 ENCOUNTER — Ambulatory Visit (INDEPENDENT_AMBULATORY_CARE_PROVIDER_SITE_OTHER): Payer: Medicare Other | Admitting: Adult Health

## 2014-10-21 VITALS — BP 119/81 | HR 82 | Ht 64.0 in | Wt 168.0 lb

## 2014-10-21 DIAGNOSIS — R51 Headache: Secondary | ICD-10-CM | POA: Diagnosis not present

## 2014-10-21 DIAGNOSIS — G250 Essential tremor: Secondary | ICD-10-CM | POA: Diagnosis not present

## 2014-10-21 DIAGNOSIS — R413 Other amnesia: Secondary | ICD-10-CM | POA: Diagnosis not present

## 2014-10-21 DIAGNOSIS — R519 Headache, unspecified: Secondary | ICD-10-CM

## 2014-10-21 NOTE — Patient Instructions (Signed)
Continue methazolamide Memory score is stable.  We will continue to monitor  Continue Imitrex.

## 2014-10-21 NOTE — Progress Notes (Signed)
I have read the note, and I agree with the clinical assessment and plan.  WILLIS,CHARLES KEITH   

## 2014-10-21 NOTE — Progress Notes (Signed)
PATIENT: [name] DOB: 1940/05/10  REASON FOR VISIT: follow up- essential tremor, headache, memory disturbance, depression  HISTORY FROM: patient  HISTORY OF PRESENT ILLNESS: [name] is a 74 year old female with a history of essential tremor, headache, memory disturbance and depression. She returns today for follow-up. The patient feels that her tremor has remained the same. She continues to take methazolamide. The tremor affects both hands right greater than left. Patient feels that her memory has remained the same. She is able to complete all ADLs independently. She operates a Teacher, music without difficulty. She realizes that her memory is directly affected by her depression. She states that Dr. Robina Ade put her on a new medicine and states that it was working well for her depression and therefore was helping her memory and tremors. However she had a incident with her husband that exacerbated her symptoms. Her husband continues to buy Xanax from a friend. Patient reports that when he takes his medication it makes him "crazy." She states that her headaches have been controlled. She states that since stopping the amitiza-- her headaches have improved. She has approximately 3 headaches a month. When she uses Imitrex her headaches usually resolve within 30 minutes. She returns today for an evaluation.  HISTORY  04/15/14:[name] is a 74 year old female with a history of essential tremor, migraine and depression. She returns today for follow-up. She is currently taking Methazolamide and reports that is working well for her tremor. Her tremor is primarily located in the hands bilaterally. The patient states that her headaches have been controlled. She has approximately 8-9 takes per month. She states that she continues 15 mg of Imitrex and that is beneficial. She states that her headaches are not that severe. She states that her pain on average is 4/10. Her headaches are noramlly located on  the top of her head. The patient reports that her memory has stayed the same. She states that she is under a lot of stress. She states that her husband will buy medication that is not prescribed to him such as xanax. She is able to complete all ADLs indepentdently. the patient also has a history of depression and she reports that that it has not been controlled. She does see Dr. Robina Ade. The patient states that she did have a fall on her patio. She did suffer bruising to the left eye.  HISTORY 08/20/13 [name]): [name] is a 75 year old right-handed white female with a history of an essential tremor affecting the voice, and upper extremities. She is on methazolamide 25 mg daily. She has a history of significant depression, and electroconvulsive therapy was contemplated previously. The patient said was placed on low-dose Synthroid, and this has significantly improved her energy level, depression, and her memory. The patient feels much better on this medication. The patient has had some headaches off and on, occurring 3 or 4 times a month. The patient indicates that 50 mg of Imitrex will help the headache. The patient reports no other new medical issues that have come up since last seen. The tremor does impact her when she is trying to write. She is able to feed herself fairly well  REVIEW OF SYSTEMS: Out of a complete 14 system review of symptoms, the patient complains only of the following symptoms, and all other reviewed systems are negative.  Fatigue, excessive sweating, hearing loss, heat intolerance, flushing, swollen abdomen, abdominal pain, constipation, daytime sleepiness, environmental allergies, food allergies, difficulty urinating, incontinence of bladder, frequency of urination, urgency,  and urine decrease, joint pain, back pain, aching muscles, walking difficulty, neck pain, rash, itching, decreased concentration, depression, nervous/anxious, weakness, tremors  ALLERGIES: Allergies  Allergen  Reactions  . Ciprofloxacin Nausea Only  . Erythromycin Other (See Comments)    Stomach burning  . Sulfa Antibiotics Nausea Only  . Latex Rash  . Shellfish Allergy Rash    HOME MEDICATIONS: Outpatient Prescriptions Prior to Visit  Medication Sig Dispense Refill  . AEROSPAN 80 MCG/ACT AERS INHALE TWO PUFFS TWICE DAILY 8.9 g 10  . Calcium Carbonate-Vitamin D (CALTRATE 600+D PO) Take 1 tablet by mouth daily.    . DULoxetine (CYMBALTA) 30 MG capsule Take 30 mg by mouth daily.    Marland Kitchen HYDROcodone-acetaminophen (NORCO) 7.5-325 MG per tablet Take 1 tablet by mouth 2 (two) times daily. For pain    . hydrocortisone 2.5 % cream daily as needed.    . hyoscyamine (LEVSIN/SL) 0.125 MG SL tablet Place 1 tablet (0.125 mg total) under the tongue every 6 (six) hours as needed. 30 tablet 0  . levothyroxine (SYNTHROID, LEVOTHROID) 50 MCG tablet Once daily    . LORazepam (ATIVAN) 1 MG tablet Take 1 tablet by mouth at bedtime.    Marland Kitchen losartan (COZAAR) 100 MG tablet Take 100 mg by mouth daily.    . magnesium hydroxide (MILK OF MAGNESIA) 400 MG/5ML suspension Take by mouth daily as needed for mild constipation.    . methazolamide (NEPTAZANE) 25 MG tablet TAKE 1 TABLET BY MOUTH EVERY DAY 30 tablet 6  . mineral oil liquid Take 15 mLs by mouth daily as needed for moderate constipation.    . Multiple Vitamins-Minerals (ICAPS PO) Take 1 tablet by mouth daily.    Marland Kitchen omeprazole (PRILOSEC) 20 MG capsule Take 1 capsule by mouth 2 (two) times daily before a meal.    . pregabalin (LYRICA) 50 MG capsule Take 50 mg by mouth 2 (two) times daily.    Marland Kitchen PROAIR HFA 108 (90 BASE) MCG/ACT inhaler INHALE TWO PUFFS BY MOUTH EVERY 4 HOURS AS NEEDED FOR WHEEZING OR  SHORTNESS  OF  BREATH 9 each 5  . SUMAtriptan (IMITREX) 50 MG tablet Take 50 mg by mouth every 2 (two) hours as needed. For migraine    . Vortioxetine HBr 5 MG TABS Take 1 tablet by mouth daily.     No facility-administered medications prior to visit.    PAST MEDICAL  HISTORY: Past Medical History  Diagnosis Date  . Fibromyalgia   . Migraines   . Hypertension   . Hyperlipidemia   . GERD (gastroesophageal reflux disease)   . Asthma   . Depression   . Diverticulosis   . Occasional tremors   . Chronic kidney disease     CKD Stage 3  . Essential tremor 05/17/2012  . Memory loss 05/17/2012  . Chronic renal insufficiency, stage III (moderate)   . Anxiety   . Arthritis   . Pneumonia   . ADD (attention deficit disorder)   . Dermatitis     PAST SURGICAL HISTORY: Past Surgical History  Procedure Laterality Date  . Total hip arthroplasty Bilateral 2009  . Appendectomy    . Tonsillectomy and adenoidectomy      age 85  . Abdominal hysterectomy    . Cataract extraction Bilateral     FAMILY HISTORY: Family History  Problem Relation Age of Onset  . Stroke Maternal Grandmother   . Heart failure Maternal Grandfather   . Emphysema Maternal Grandfather   . Congestive Heart Failure Maternal Grandfather   .  Coronary artery disease Paternal Grandmother   . Coronary artery disease Paternal Grandfather   . Liver cancer Mother   . Pulmonary embolism Mother   . Prostate cancer Father   . Prostate cancer Paternal Uncle     x 2  . ALS Paternal Aunt   . Brain cancer Paternal Aunt   . Breast cancer Paternal Aunt   . Lung cancer Maternal Aunt   . Depression Brother     suicide  . Diabetes Paternal Aunt     x 2    SOCIAL HISTORY: Social History   Social History  . Marital Status: Married    Spouse Name: [name]  . Number of Children: 2  . Years of Education: hs   Occupational History  . Retired   .     Social History Main Topics  . Smoking status: Never Smoker   . Smokeless tobacco: Never Used  . Alcohol Use: No  . Drug Use: No  . Sexual Activity: No   Other Topics Concern  . Not on file   Social History Narrative   Patient lives at home with her husband [name]).    Retired.   Education high school.   Right handed.   Caffeine two  cups of coffee daily and one glass of sweet tea.      PHYSICAL EXAM  Filed Vitals:   10/21/14 1423  BP: 119/81  Pulse: 82  Height: 5\' 4"  (1.626 m)  Weight: 168 lb (76.204 kg)   Body mass index is 28.82 kg/(m^2).  Generalized: Well developed, in no acute distress   Neurological examination  Mentation: Alert oriented to time, place, history taking. Follows all commands speech and language fluent Cranial nerve II-XII:  Extraocular movements were full, visual field were full on confrontational test. Facial sensation and strength were normal. Uvula tongue midline. Head turning and shoulder shrug  were normal and symmetric. Motor: The motor testing reveals 5 over 5 strength of all 4 extremities. Good symmetric motor tone is noted throughout.  Sensory: Sensory testing is intact to soft touch on all 4 extremities. No evidence of extinction is noted.  Coordination: Cerebellar testing reveals good finger-nose-finger and heel-to-shin bilaterally.  Gait and station: Gait is normal. Tandem gait is normal. Romberg is negative. No drift is seen.  Reflexes: Deep tendon reflexes are symmetric and normal bilaterally.   DIAGNOSTIC DATA (LABS, IMAGING, TESTING) - I reviewed patient records, labs, notes, testing and imaging myself where available.     ASSESSMENT AND PLAN 74 y.o. year old female  has a past medical history of Fibromyalgia; Migraines; Hypertension; Hyperlipidemia; GERD (gastroesophageal reflux disease); Asthma; Depression; Diverticulosis; Occasional tremors; Chronic kidney disease; Essential tremor (05/17/2012); Memory loss (05/17/2012); Chronic renal insufficiency, stage III (moderate); Anxiety; Arthritis; Pneumonia; ADD (attention deficit disorder); and Dermatitis. here with:  1. Essential tremor 2. Migraines 3. Memory disturbance  Overall the patient is doing well. She will continue on the methazolamide. Her migraines have been controlled. She will continue to use Imitrex as needed.  The patient's memory has remained stable. MMSE today is 30/30. I have advised patient that if her symptoms worsen she should let us know. Otherwise she will follow-up in 6 months or sooner if needed.     Ward Givens, MSN, NP-C 10/21/2014, 3:13 PM [facility] [address], [place], [place] [address] [phone]

## 2014-11-20 DIAGNOSIS — Z961 Presence of intraocular lens: Secondary | ICD-10-CM | POA: Diagnosis not present

## 2014-11-30 ENCOUNTER — Other Ambulatory Visit: Payer: Self-pay | Admitting: Neurology

## 2014-12-09 DIAGNOSIS — E559 Vitamin D deficiency, unspecified: Secondary | ICD-10-CM | POA: Diagnosis not present

## 2014-12-29 ENCOUNTER — Telehealth: Payer: Self-pay | Admitting: Gastroenterology

## 2014-12-29 ENCOUNTER — Encounter: Payer: Self-pay | Admitting: Adult Health

## 2014-12-29 ENCOUNTER — Ambulatory Visit (INDEPENDENT_AMBULATORY_CARE_PROVIDER_SITE_OTHER): Payer: Medicare Other | Admitting: Adult Health

## 2014-12-29 ENCOUNTER — Telehealth: Payer: Self-pay | Admitting: *Deleted

## 2014-12-29 ENCOUNTER — Other Ambulatory Visit: Payer: Self-pay | Admitting: Nurse Practitioner

## 2014-12-29 VITALS — BP 118/75 | HR 82 | Ht 64.0 in | Wt 167.5 lb

## 2014-12-29 DIAGNOSIS — R51 Headache: Secondary | ICD-10-CM | POA: Diagnosis not present

## 2014-12-29 DIAGNOSIS — G250 Essential tremor: Secondary | ICD-10-CM | POA: Diagnosis not present

## 2014-12-29 DIAGNOSIS — F32A Depression, unspecified: Secondary | ICD-10-CM

## 2014-12-29 DIAGNOSIS — R202 Paresthesia of skin: Secondary | ICD-10-CM

## 2014-12-29 DIAGNOSIS — R200 Anesthesia of skin: Secondary | ICD-10-CM

## 2014-12-29 DIAGNOSIS — F329 Major depressive disorder, single episode, unspecified: Secondary | ICD-10-CM

## 2014-12-29 DIAGNOSIS — R519 Headache, unspecified: Secondary | ICD-10-CM

## 2014-12-29 MED ORDER — HYOSCYAMINE SULFATE 0.125 MG SL SUBL: 0.1250 mg | SUBLINGUAL_TABLET | Freq: Four times a day (QID) | SUBLINGUAL | Status: DC | PRN

## 2014-12-29 NOTE — Patient Instructions (Signed)
I will check MRI brain  I will check lab work today for MRI If your symptoms worsen or you develop new symptoms please let us know.

## 2014-12-29 NOTE — Telephone Encounter (Signed)
Dr Silverio Decamp can patient have this refill

## 2014-12-29 NOTE — Telephone Encounter (Signed)
Ok to refill 

## 2014-12-29 NOTE — Telephone Encounter (Signed)
Lm for the patient to call our office regarding medication refill for Levsin ( Hyoscyamine). Advised on the message to call our office to choose another MD since Dr. Olevia Perches retired. I did leave Dr. Silverio Decamp and Dr. Doyne Keel name on the message. She can make a future appiontment and we can then refill the prescription .

## 2014-12-29 NOTE — Telephone Encounter (Signed)
Sent med in L/M for patient

## 2014-12-29 NOTE — Progress Notes (Signed)
I have read the note, and I agree with the clinical assessment and plan.  WILLIS,CHARLES KEITH   

## 2014-12-29 NOTE — Progress Notes (Signed)
PATIENT: [name] DOB: 20-Nov-1940  REASON FOR VISIT: follow up-  Essential tremor, headache, memory disturbance, depression HISTORY FROM: patient  HISTORY OF PRESENT ILLNESS:  [name] is a 74 year old female with a history of essential tremor, headache, memory disturbance and depression. She returns today complaining of headaches. In the past her headaches have been controlled. She states that in the last 2 months her headache frequency has increased. She describes her headache as a pressure on top of the head. She also will have pain that radiates to the temporal area into the occipital area. She states that she fell a while back and hit her left eye. Unsure if this has anything to do with the headaches. She states this is not like her typical migraines. They normally start in the frontal region and migrate to the right side of the head. She states that she has had an eye exam since the last visit and her left eye has changed significantly compared to the right according to the patient. She states that she has typically 3-4 headaches a week. She does not feel that her depression has gotten any worse. She states that typically she has a lot of family stressors however this under control at the moment. She does have urinary incontinence but that has been going on for 2-3 years. Denies bowel incontinence. She does feel that her balance has changed. She feels like she staggers when she walks. Denies any recent falls. She does state that she recently had tingling sensation in the left side of the face. She states that it was not accompanied with any weakness or facial droop. It eventually resolved spontaneously. The patient does take hydrocodone for her fibromyalgia. She returns today for an evaluation.  HISTORY 10/21/14:[name] is a 74 year old female with a history of essential tremor, headache, memory disturbance and depression. She returns today for follow-up. The patient feels that her  tremor has remained the same. She continues to take methazolamide. The tremor affects both hands right greater than left. Patient feels that her memory has remained the same. She is able to complete all ADLs independently. She operates a Teacher, music without difficulty. She realizes that her memory is directly affected by her depression. She states that Dr. Robina Ade put her on a new medicine and states that it was working well for her depression and therefore was helping her memory and tremors. However she had a incident with her husband that exacerbated her symptoms. Her husband continues to buy Xanax from a friend. Patient reports that when he takes his medication it makes him "crazy." She states that her headaches have been controlled. She states that since stopping the amitiza-- her headaches have improved. She has approximately 3 headaches a month. When she uses Imitrex her headaches usually resolve within 30 minutes. She returns today for an evaluation.   REVIEW OF SYSTEMS: Out of a complete 14 system review of symptoms, the patient complains only of the following symptoms, and all other reviewed systems are negative.   fatigue, excessive sweating, hearing loss, eye redness, light sensitivity, blurred vision, shortness of breath, palpitations, constipation, nausea, heat intolerance, flushing, environmental allergies, food allergies, difficulty urinating, incontinence of bladder, frequency of urination, urgency, joint pain, back pain, muscle aches, walking difficulty, neck pain, neck stiffness, rash, itching, depression, nervous/anxious, weakness, tremors, headache, memory loss, bruise/bleed easily ALLERGIES: Allergies  Allergen Reactions  . Ciprofloxacin Nausea Only  . Erythromycin Other (See Comments)    Stomach burning  . Nitrofuran  Derivatives Nausea Only  . Sulfa Antibiotics Nausea Only  . Latex Rash  . Shellfish Allergy Rash    HOME MEDICATIONS: Outpatient Prescriptions Prior to Visit    Medication Sig Dispense Refill  . AEROSPAN 80 MCG/ACT AERS INHALE TWO PUFFS TWICE DAILY 8.9 g 10  . Calcium Carbonate-Vitamin D (CALTRATE 600+D PO) Take 1 tablet by mouth daily.    . DULoxetine (CYMBALTA) 30 MG capsule Take 30 mg by mouth daily.    Marland Kitchen HYDROcodone-acetaminophen (NORCO) 7.5-325 MG per tablet Take 1 tablet by mouth 2 (two) times daily. For pain    . hydrocortisone 2.5 % cream daily as needed.    . hyoscyamine (LEVSIN/SL) 0.125 MG SL tablet Place 1 tablet (0.125 mg total) under the tongue every 6 (six) hours as needed. 30 tablet 1  . levothyroxine (SYNTHROID, LEVOTHROID) 50 MCG tablet Once daily    . LORazepam (ATIVAN) 1 MG tablet Take 1 tablet by mouth at bedtime.    Marland Kitchen losartan (COZAAR) 100 MG tablet Take 100 mg by mouth daily.    . magnesium hydroxide (MILK OF MAGNESIA) 400 MG/5ML suspension Take by mouth daily as needed for mild constipation.    . methazolamide (NEPTAZANE) 25 MG tablet TAKE 1 TABLET BY MOUTH EVERY DAY 30 tablet 6  . mineral oil liquid Take 15 mLs by mouth daily as needed for moderate constipation.    . Multiple Vitamins-Minerals (ICAPS PO) Take 1 tablet by mouth daily.    Marland Kitchen omeprazole (PRILOSEC) 20 MG capsule Take 1 capsule by mouth 2 (two) times daily before a meal.    . pregabalin (LYRICA) 50 MG capsule Take 50 mg by mouth 2 (two) times daily.    Marland Kitchen PROAIR HFA 108 (90 BASE) MCG/ACT inhaler INHALE TWO PUFFS BY MOUTH EVERY 4 HOURS AS NEEDED FOR WHEEZING OR  SHORTNESS  OF  BREATH 9 each 5  . SUMAtriptan (IMITREX) 50 MG tablet Take 50 mg by mouth every 2 (two) hours as needed. For migraine    . Vortioxetine HBr 5 MG TABS Take 1 tablet by mouth daily.    . hyoscyamine (LEVSIN/SL) 0.125 MG SL tablet Place 1 tablet (0.125 mg total) under the tongue every 6 (six) hours as needed. 30 tablet 0   No facility-administered medications prior to visit.    PAST MEDICAL HISTORY: Past Medical History  Diagnosis Date  . Fibromyalgia   . Migraines   . Hypertension   .  Hyperlipidemia   . GERD (gastroesophageal reflux disease)   . Asthma   . Depression   . Diverticulosis   . Occasional tremors   . Chronic kidney disease     CKD Stage 3  . Essential tremor 05/17/2012  . Memory loss 05/17/2012  . Chronic renal insufficiency, stage III (moderate)   . Anxiety   . Arthritis   . Pneumonia   . ADD (attention deficit disorder)   . Dermatitis     PAST SURGICAL HISTORY: Past Surgical History  Procedure Laterality Date  . Total hip arthroplasty Bilateral 2009  . Appendectomy    . Tonsillectomy and adenoidectomy      age 8  . Abdominal hysterectomy    . Cataract extraction Bilateral     FAMILY HISTORY: Family History  Problem Relation Age of Onset  . Stroke Maternal Grandmother   . Heart failure Maternal Grandfather   . Emphysema Maternal Grandfather   . Congestive Heart Failure Maternal Grandfather   . Coronary artery disease Paternal Grandmother   . Coronary artery  disease Paternal Grandfather   . Liver cancer Mother   . Pulmonary embolism Mother   . Prostate cancer Father   . Prostate cancer Paternal Uncle     x 2  . ALS Paternal Aunt   . Brain cancer Paternal Aunt   . Breast cancer Paternal Aunt   . Lung cancer Maternal Aunt   . Depression Brother     suicide  . Diabetes Paternal Aunt     x 2    SOCIAL HISTORY: Social History   Social History  . Marital Status: Married    Spouse Name: [name]  . Number of Children: 2  . Years of Education: hs   Occupational History  . Retired   .     Social History Main Topics  . Smoking status: Never Smoker   . Smokeless tobacco: Never Used  . Alcohol Use: No  . Drug Use: No  . Sexual Activity: No   Other Topics Concern  . Not on file   Social History Narrative   Patient lives at home with her husband [name]).    Retired.   Education high school.   Right handed.   Caffeine two cups of coffee daily and one glass of sweet tea.      PHYSICAL EXAM  Filed Vitals:   12/29/14 1413   BP: 118/75  Pulse: 82  Height: 5\' 4"  (1.626 m)  Weight: 167 lb 8 oz (75.978 kg)   Body mass index is 28.74 kg/(m^2).  MMSE - Mini Mental State Exam 12/29/2014 04/15/2014  Orientation to time 5 5  Orientation to Place 5 5  Registration 3 3  Attention/ Calculation 5 5  Recall 3 3  Language- name 2 objects 2 2  Language- repeat 1 1  Language- follow 3 step command 3 3  Language- read & follow direction 1 1  Write a sentence 1 1  Copy design 1 1  Total score 30 30      Generalized: Well developed, in no acute distress   Neurological examination  Mentation: Alert oriented to time, place, history taking. Follows all commands speech and language fluent Cranial nerve II-XII: Pupils were equal round reactive to light. Extraocular movements were full, visual field were full on confrontational test. Facial sensation and strength were normal. Uvula tongue midline. Head turning and shoulder shrug  were normal and symmetric. Motor: The motor testing reveals 5 over 5 strength of all 4 extremities. Good symmetric motor tone is noted throughout.  Sensory: Sensory testing is intact to soft touch on all 4 extremities. No evidence of extinction is noted.  Coordination: Cerebellar testing reveals good finger-nose-finger and heel-to-shin bilaterally.  Gait and station: Gait is normal. Tandem gait is normal. Romberg is negative. No drift is seen.  Reflexes: Deep tendon reflexes are symmetric and normal bilaterally.   DIAGNOSTIC DATA (LABS, IMAGING, TESTING) - I reviewed patient records, labs, notes, testing and imaging myself where available.   ASSESSMENT AND PLAN 74 y.o. year old female  has a past medical history of Fibromyalgia; Migraines; Hypertension; Hyperlipidemia; GERD (gastroesophageal reflux disease); Asthma; Depression; Diverticulosis; Occasional tremors; Chronic kidney disease; Essential tremor (05/17/2012); Memory loss (05/17/2012); Chronic renal insufficiency, stage III (moderate); Anxiety;  Arthritis; Pneumonia; ADD (attention deficit disorder); and Dermatitis. here with:   1.  Headaches 2. Essential tremor  3. Depression 4. Tingling left side of face   The patient's headache frequency has increased in the last 2 months. These headaches do not present like her original headache. Patient  is concerned that the fall that she had a while back may be the cause even though  her headaches did not begin after that. Patient also reports some numbness and tingling on the left side of the face that occurred and then resolve spontaneously. I will check an MRI of the brain to look for any acute changes. I will check blood work to evaluate her kidney function before contrast.  At this time the patient does not want to try preventative medication for her headaches. Patient advised that if her symptoms worsen or she develops any new symptoms she should let us know.  She will keep her appointment in March with Dr. Lucio Edward, MSN, NP-C 12/29/2014, 2:26 PM [facility] [address] [place], [place] [address] [phone]

## 2014-12-30 ENCOUNTER — Telehealth: Payer: Self-pay

## 2014-12-30 LAB — COMPREHENSIVE METABOLIC PANEL
ALT: 12 IU/L (ref 0–32)
AST: 18 IU/L (ref 0–40)
Albumin/Globulin Ratio: 1.6 (ref 1.1–2.5)
Albumin: 4.2 g/dL (ref 3.5–4.8)
Alkaline Phosphatase: 80 IU/L (ref 39–117)
BUN/Creatinine Ratio: 17 (ref 11–26)
BUN: 17 mg/dL (ref 8–27)
Bilirubin Total: 0.5 mg/dL (ref 0.0–1.2)
CO2: 26 mmol/L (ref 18–29)
Calcium: 10.1 mg/dL (ref 8.7–10.3)
Chloride: 100 mmol/L (ref 97–106)
Creatinine, Ser: 0.98 mg/dL (ref 0.57–1.00)
GFR calc Af Amer: 67 mL/min/{1.73_m2} (ref >59)
GFR calc non Af Amer: 58 mL/min/{1.73_m2} — ABNORMAL LOW (ref >59)
Globulin, Total: 2.7 g/dL (ref 1.5–4.5)
Glucose: 78 mg/dL (ref 65–99)
Potassium: 4.5 mmol/L (ref 3.5–5.2)
Sodium: 138 mmol/L (ref 136–144)
Total Protein: 6.9 g/dL (ref 6.0–8.5)

## 2014-12-30 NOTE — Telephone Encounter (Signed)
-----   Message from Ward Givens, NP sent at 12/30/2014  8:03 AM EST ----- Lab work is normal. Please call patient.

## 2014-12-30 NOTE — Telephone Encounter (Signed)
Spoke to patient. Gave lab results. Patient verbalized understanding.  

## 2015-01-02 DIAGNOSIS — Z23 Encounter for immunization: Secondary | ICD-10-CM | POA: Diagnosis not present

## 2015-01-16 ENCOUNTER — Ambulatory Visit
Admission: RE | Admit: 2015-01-16 | Discharge: 2015-01-16 | Disposition: A | Discharge status: Home / Self Care | Payer: Medicare Other | Source: Ambulatory Visit | Attending: Adult Health | Admitting: Adult Health

## 2015-01-16 DIAGNOSIS — R51 Headache: Secondary | ICD-10-CM | POA: Diagnosis not present

## 2015-01-16 DIAGNOSIS — R200 Anesthesia of skin: Secondary | ICD-10-CM

## 2015-01-16 DIAGNOSIS — R519 Headache, unspecified: Secondary | ICD-10-CM

## 2015-01-16 DIAGNOSIS — R202 Paresthesia of skin: Secondary | ICD-10-CM

## 2015-01-20 ENCOUNTER — Telehealth: Payer: Self-pay | Admitting: Adult Health

## 2015-01-20 DIAGNOSIS — R930 Abnormal findings on diagnostic imaging of skull and head, not elsewhere classified: Secondary | ICD-10-CM

## 2015-01-20 NOTE — Telephone Encounter (Signed)
I called the patient. She does have some microvascular ischemic changes on her MRI. I consulted with Dr. Jannifer Franklin and at this time we will start the patient on low-dose aspirin. The patient although states that she is not supposed to be on aspirin due to her kidney function. Her last lab results that are visible to me shows relatively normal kidney function. Instructed her to consult with her primary care regarding this. I will also set the patient up for carotid Dopplers. Patient  Is amenable to this plan.

## 2015-01-21 ENCOUNTER — Other Ambulatory Visit: Payer: Self-pay | Admitting: Adult Health

## 2015-01-21 DIAGNOSIS — R9089 Other abnormal findings on diagnostic imaging of central nervous system: Secondary | ICD-10-CM

## 2015-01-22 ENCOUNTER — Other Ambulatory Visit: Payer: Self-pay | Admitting: Adult Health

## 2015-01-27 ENCOUNTER — Encounter (HOSPITAL_COMMUNITY): Payer: Medicare Other

## 2015-01-28 DIAGNOSIS — F329 Major depressive disorder, single episode, unspecified: Secondary | ICD-10-CM | POA: Diagnosis not present

## 2015-01-28 DIAGNOSIS — I10 Essential (primary) hypertension: Secondary | ICD-10-CM | POA: Diagnosis not present

## 2015-01-29 ENCOUNTER — Ambulatory Visit (HOSPITAL_COMMUNITY)
Admission: RE | Admit: 2015-01-29 | Discharge: 2015-01-29 | Disposition: A | Discharge status: Home / Self Care | Payer: Medicare Other | Source: Ambulatory Visit | Attending: Adult Health | Admitting: Adult Health

## 2015-01-29 DIAGNOSIS — R51 Headache: Secondary | ICD-10-CM | POA: Diagnosis not present

## 2015-01-29 DIAGNOSIS — R9089 Other abnormal findings on diagnostic imaging of central nervous system: Secondary | ICD-10-CM

## 2015-01-29 DIAGNOSIS — E785 Hyperlipidemia, unspecified: Secondary | ICD-10-CM | POA: Insufficient documentation

## 2015-01-29 DIAGNOSIS — I129 Hypertensive chronic kidney disease with stage 1 through stage 4 chronic kidney disease, or unspecified chronic kidney disease: Secondary | ICD-10-CM | POA: Insufficient documentation

## 2015-01-29 DIAGNOSIS — N183 Chronic kidney disease, stage 3 (moderate): Secondary | ICD-10-CM | POA: Diagnosis not present

## 2015-01-29 DIAGNOSIS — R930 Abnormal findings on diagnostic imaging of skull and head, not elsewhere classified: Secondary | ICD-10-CM | POA: Diagnosis not present

## 2015-01-29 NOTE — Progress Notes (Signed)
Preliminary results by tech - Carotid Duplex Completed. No evidence of stenosis noted in bilateral carotid arteries. Vertebral arteries demonstrate antegrade flow.  Oda Cogan, BS, RDMS, RVT

## 2015-02-02 ENCOUNTER — Telehealth: Payer: Self-pay

## 2015-02-02 NOTE — Telephone Encounter (Signed)
Spoke to patient. Went over MRI results. Patient verbalized understanding.

## 2015-02-02 NOTE — Telephone Encounter (Signed)
Please review MRI with patient as we discussed. thanks

## 2015-02-02 NOTE — Telephone Encounter (Signed)
Called patient. Gave carotid doppler results. Patient verbalized understanding. She would like for NP-Megan to give her a call and go over MRI results again b/c she was half asleep when she called previously.

## 2015-02-02 NOTE — Telephone Encounter (Signed)
-----   Message from Ward Givens, NP sent at 02/02/2015 11:03 AM EST ----- Carotid dopplers did not show stenosis (narrowing of arteries). Please call the patient.

## 2015-03-03 ENCOUNTER — Telehealth: Payer: Self-pay | Admitting: Pulmonary Disease

## 2015-03-03 NOTE — Telephone Encounter (Signed)
lmtcb x1 for pt. 

## 2015-03-04 MED ORDER — FLUNISOLIDE HFA 80 MCG/ACT IN AERS: 2.0000 | INHALATION_SPRAY | Freq: Two times a day (BID) | RESPIRATORY_TRACT | Status: DC

## 2015-03-04 NOTE — Telephone Encounter (Signed)
Spoke with the pt  She states her sample of aerospan expired 02/14/15  I advised her to not use this, as it may not be as effective  I have sent her a new rx to her pharm per her req Nothing further needed

## 2015-03-06 ENCOUNTER — Ambulatory Visit (INDEPENDENT_AMBULATORY_CARE_PROVIDER_SITE_OTHER): Payer: Medicare Other | Admitting: Gastroenterology

## 2015-03-06 ENCOUNTER — Encounter: Payer: Self-pay | Admitting: Gastroenterology

## 2015-03-06 VITALS — BP 110/70 | HR 78 | Ht 64.0 in | Wt 168.2 lb

## 2015-03-06 DIAGNOSIS — K5900 Constipation, unspecified: Secondary | ICD-10-CM

## 2015-03-06 NOTE — Progress Notes (Signed)
[address]ER    [name]    1940/12/26  Primary Care Physician:STONEKING,HAL Marcello Moores, MD  Referring Physician: Lajean Manes, MD [address], [place] [address]  Chief complaint:  Chronic constipation   HPI: 75 year old female with chronic constipation is here for follow-up visit. She was followed by Dr. Ernest Haber and subsequently by Dr. Olevia Perches prior to this visit . She has tried multiple laxatives in the past, was on Amitiza 8 g twice a day increased to 24 g twice a day. After a few months she didn't think it was effective and was switched  to [place] which apparently caused severe cramps and diarrhea. She she is currently on MiraLAX 17 g daily with 2-3 bowel movements a day and at times has liquid stool  She has had extensive GI workup in the past which includes HIDA scan that was normal with ejection fraction of 80%. Gastric emptying scan was also normal with 91% emptying in 2 hours. Last upper endoscopy by Dr. Alycia Rossetti in July 2012 was normal. Colonoscopy in July 2012 was also normal. Prior colonoscopy in 2006. Denies any nausea, vomiting, abdominal pain, melena or bright red blood per rectum    Outpatient Encounter Prescriptions as of 03/06/2015  Medication Sig  . Calcium Carbonate-Vitamin D (CALTRATE 600+D PO) Take 1 tablet by mouth daily.  . Coenzyme Q10 (CO Q 10 PO) Take 1 tablet by mouth daily.  . DULoxetine (CYMBALTA) 30 MG capsule Take 30 mg by mouth daily.  . ergocalciferol (VITAMIN D2) 50000 UNITS capsule Take 50,000 Units by mouth once a week.  . Flunisolide HFA (AEROSPAN) 80 MCG/ACT AERS Inhale 2 puffs into the lungs 2 (two) times daily.  Marland Kitchen HYDROcodone-acetaminophen (NORCO) 7.5-325 MG per tablet Take 1 tablet by mouth 2 (two) times daily. For pain  . hydrocortisone 2.5 % cream daily as needed.  . hyoscyamine (LEVSIN/SL) 0.125 MG SL tablet Place 1 tablet (0.125 mg total) under the tongue every 6 (six) hours as needed.  Marland Kitchen  levothyroxine (SYNTHROID, LEVOTHROID) 50 MCG tablet Once daily  . LORazepam (ATIVAN) 1 MG tablet Take 1 tablet by mouth at bedtime.  Marland Kitchen losartan (COZAAR) 100 MG tablet Take 100 mg by mouth daily.  . methazolamide (NEPTAZANE) 25 MG tablet TAKE 1 TABLET BY MOUTH EVERY DAY  . mineral oil liquid Take 15 mLs by mouth daily as needed for moderate constipation.  . Multiple Vitamins-Minerals (ICAPS PO) Take 1 tablet by mouth daily.  Marland Kitchen omeprazole (PRILOSEC) 20 MG capsule Take 1 capsule by mouth 2 (two) times daily before a meal.  . polyethylene glycol (MIRALAX / GLYCOLAX) packet Take 17 g by mouth daily.  . pregabalin (LYRICA) 50 MG capsule Take 50 mg by mouth daily.   Marland Kitchen PROAIR HFA 108 (90 BASE) MCG/ACT inhaler INHALE TWO PUFFS BY MOUTH EVERY 4 HOURS AS NEEDED FOR WHEEZING OR  SHORTNESS  OF  BREATH  . SUMAtriptan (IMITREX) 50 MG tablet Take 50 mg by mouth every 2 (two) hours as needed. For migraine  . Vortioxetine HBr 5 MG TABS Take 1 tablet by mouth daily. 1-2 tablets daily  . [DISCONTINUED] magnesium hydroxide (MILK OF MAGNESIA) 400 MG/5ML suspension Take by mouth daily as needed for mild constipation.   No facility-administered encounter medications on file as of 03/06/2015.    Allergies as of 03/06/2015 - Review Complete 03/06/2015  Allergen Reaction Noted  . Ciprofloxacin Nausea Only 12/12/2011  . Erythromycin Other (See Comments)   .  Nitrofuran derivatives Nausea Only 12/29/2014  . Sulfa antibiotics Nausea Only 12/12/2011  . Latex Rash 03/03/2012  . Shellfish allergy Rash 03/03/2012    Past Medical History  Diagnosis Date  . Fibromyalgia   . Migraines   . Hypertension   . Hyperlipidemia   . GERD (gastroesophageal reflux disease)   . Asthma   . Depression   . Diverticulosis   . Occasional tremors   . Chronic kidney disease     CKD Stage 3  . Essential tremor 05/17/2012  . Memory loss 05/17/2012  . Chronic renal insufficiency, stage III (moderate)   . Anxiety   . Arthritis   .  Pneumonia   . ADD (attention deficit disorder)   . Dermatitis     Past Surgical History  Procedure Laterality Date  . Total hip arthroplasty Bilateral 2009    Dr Adriana Mccallum  . Appendectomy    . Tonsillectomy and adenoidectomy      age 26  . Abdominal hysterectomy    . Cataract extraction Bilateral     Family History  Problem Relation Age of Onset  . Stroke Maternal Grandmother   . Heart failure Maternal Grandfather   . Emphysema Maternal Grandfather   . Congestive Heart Failure Maternal Grandfather   . Coronary artery disease Paternal Grandmother   . Coronary artery disease Paternal Grandfather   . Liver cancer Mother   . Pulmonary embolism Mother   . Prostate cancer Father   . Prostate cancer Paternal Uncle     x 2  . ALS Paternal Aunt   . Brain cancer Paternal Aunt   . Breast cancer Paternal Aunt   . Lung cancer Maternal Aunt   . Depression Brother     suicide  . Diabetes Paternal Aunt     x 2    Social History   Social History  . Marital Status: Married    Spouse Name: [name]  . Number of Children: 2  . Years of Education: hs   Occupational History  . Retired   .     Social History Main Topics  . Smoking status: Never Smoker   . Smokeless tobacco: Never Used  . Alcohol Use: No  . Drug Use: No  . Sexual Activity: No   Other Topics Concern  . Not on file   Social History Narrative   Patient lives at home with her husband [name]).    Retired.   Education high school.   Right handed.   Caffeine two cups of coffee daily and one glass of sweet tea.      Review of systems: Review of Systems  Constitutional: Negative for fever and chills.  HENT: Negative.   Eyes: Negative for blurred vision.  Respiratory: Negative for cough, shortness of breath and wheezing.   Cardiovascular: Negative for chest pain and palpitations.  Gastrointestinal: as per HPI Genitourinary: Negative for dysuria, urgency, frequency and hematuria.  Musculoskeletal: Negative  for myalgias, back pain and joint pain.  Skin: Negative for itching and rash.  Neurological: Negative for dizziness, tremors, focal weakness, seizures and loss of consciousness.  Endo/Heme/Allergies: Negative for environmental allergies.  Psychiatric/Behavioral: Negative for depression, suicidal ideas and hallucinations.  All other systems reviewed and are negative.   Physical Exam: Filed Vitals:   03/06/15 1451  BP: 110/70  Pulse: 78   Gen:      No acute distress HEENT:  EOMI, sclera anicteric Neck:     No masses; no thyromegaly Lungs:    Clear  to auscultation bilaterally; normal respiratory effort CV:         Regular rate and rhythm; no murmurs Abd:      + bowel sounds; soft, non-tender; no palpable masses, no distension Ext:    No edema; adequate peripheral perfusion Skin:      Warm and dry; no rash Neuro: alert and oriented x 3 Psych: normal mood and affect  Data Reviewed:  Per history of present illness   Assessment and Plan/Recommendations:  75 year old female here for follow-up of chronic constipation with improvement of symptoms on MiraLAX once daily Since seems to have slightly increased bowel frequency with loose stools on one capful daily of MiraLAX, advised her to decrease it to half a capful daily and titrate accordingly to have one to 2 soft bowel movements per day Due for screening colonoscopy in 2022 Return in 1 year or sooner if needed  K. Denzil Magnuson , MD [phone] Mon-Fri 8a-5p [phone] after 5p, weekends, holidays

## 2015-03-06 NOTE — Patient Instructions (Addendum)
Follow up in one year Colonoscopy recall in in 2022

## 2015-03-12 DIAGNOSIS — R32 Unspecified urinary incontinence: Secondary | ICD-10-CM | POA: Diagnosis not present

## 2015-03-12 DIAGNOSIS — E559 Vitamin D deficiency, unspecified: Secondary | ICD-10-CM | POA: Diagnosis not present

## 2015-03-12 DIAGNOSIS — R5383 Other fatigue: Secondary | ICD-10-CM | POA: Diagnosis not present

## 2015-03-13 DIAGNOSIS — R32 Unspecified urinary incontinence: Secondary | ICD-10-CM | POA: Diagnosis not present

## 2015-03-24 ENCOUNTER — Telehealth: Payer: Self-pay | Admitting: Gastroenterology

## 2015-03-24 ENCOUNTER — Ambulatory Visit: Payer: Medicare Other | Admitting: Pulmonary Disease

## 2015-03-24 NOTE — Telephone Encounter (Signed)
Patient is instructed on this.

## 2015-03-24 NOTE — Telephone Encounter (Signed)
Patient calls with complaints of nausea that awakens her every morning. She is having 5 to 6 liquid stools every day. She is taking daily Miralax and mineral oil.  I advised her to not take any Miralax or mineral oil today. She does not have anything to take for nausea. She is taking Pepto-Bismol.

## 2015-03-24 NOTE — Telephone Encounter (Signed)
Agree with holding Miralax if she has increased bowel frequency and she needs to avoid Mineral oil. I do not recommend continued use of it. She will need f/u visit in office

## 2015-04-13 ENCOUNTER — Ambulatory Visit (INDEPENDENT_AMBULATORY_CARE_PROVIDER_SITE_OTHER): Payer: Medicare Other | Admitting: Pulmonary Disease

## 2015-04-13 ENCOUNTER — Encounter: Payer: Self-pay | Admitting: Pulmonary Disease

## 2015-04-13 VITALS — BP 110/82 | HR 82 | Ht 64.0 in | Wt 166.6 lb

## 2015-04-13 DIAGNOSIS — K219 Gastro-esophageal reflux disease without esophagitis: Secondary | ICD-10-CM | POA: Diagnosis not present

## 2015-04-13 DIAGNOSIS — J4530 Mild persistent asthma, uncomplicated: Secondary | ICD-10-CM

## 2015-04-13 NOTE — Assessment & Plan Note (Signed)
Stay on aerospan at bedtime Albuterol as needed only  IN future, try to dc aerospan if remains controlled

## 2015-04-13 NOTE — Patient Instructions (Signed)
Stay on aerospan at bedtime Albuterol as needed only

## 2015-04-13 NOTE — Assessment & Plan Note (Signed)
Ct omeprazole

## 2015-04-13 NOTE — Progress Notes (Signed)
   Subjective:    Patient ID: Devian "RAYOLA FLYTHE, female    DOB: [birth year], 75 y.o.   MRN: [name]  HPI  74/F  never smoker for FU of asthma She has multiple medical problems including movement disorder, hypertension dyslipidemia , asthma, depression and fibromyalgia  She has family history of pulmonary embolism in her mother and lung cancer in a maternal aunt.She was seen by the cardiologist & underwent nuclear stress test which was negative for any ischemia.  It was felt that her atypical chest pain may have been due to GERD or anxiety  CT chest in 07/2007, 05/2012 neg    06/2012 PFT showed Nml FEV1 at 114% , min drop in ratio at 63, Mild obstruction in mid flows at 58% w/ 20% BD response.     04/13/2015  Chief Complaint  Patient presents with  . Follow-up    Patient needs samples of Aerospan, samples that she picked up are all expired.  breathing doing good some days, most days does not have to take albuterol.     74m FU  Sees dr Toy Care for depression Underwent MRI brain She remains on Aerospan, using 1 puff daily Aerospan samples were all expired Considering ketamine therapy for depression Uses albuterol sparingly   Patient denies any chest pain, orthopnea, PND, leg swelling, cough or discolored mucus.  No hospitalizations or emergency room visits.  Review of Systems neg for any significant sore throat, dysphagia, itching, sneezing, nasal congestion or excess/ purulent secretions, fever, chills, sweats, unintended wt loss, pleuritic or exertional cp, hempoptysis, orthopnea pnd or change in chronic leg swelling. Also denies presyncope, palpitations, heartburn, abdominal pain, nausea, vomiting, diarrhea or change in bowel or urinary habits, dysuria,hematuria, rash, arthralgias, visual complaints, headache, numbness weakness or ataxia.     Objective:   Physical Exam  Gen. Pleasant, well-nourished, in no distress ENT - no lesions, no post nasal drip Neck: No JVD, no  thyromegaly, no carotid bruits Lungs: no use of accessory muscles, no dullness to percussion, clear without rales or rhonchi  Cardiovascular: Rhythm regular, heart sounds  normal, no murmurs or gallops, no peripheral edema Musculoskeletal: No deformities, no cyanosis or clubbing        Assessment & Plan:

## 2015-04-21 ENCOUNTER — Encounter: Payer: Self-pay | Admitting: Neurology

## 2015-04-21 ENCOUNTER — Ambulatory Visit (INDEPENDENT_AMBULATORY_CARE_PROVIDER_SITE_OTHER): Payer: Medicare Other | Admitting: Neurology

## 2015-04-21 VITALS — BP 128/78 | HR 82 | Resp 20 | Ht 64.0 in | Wt 166.0 lb

## 2015-04-21 DIAGNOSIS — G43809 Other migraine, not intractable, without status migrainosus: Secondary | ICD-10-CM | POA: Diagnosis not present

## 2015-04-21 DIAGNOSIS — G250 Essential tremor: Secondary | ICD-10-CM

## 2015-04-21 DIAGNOSIS — R413 Other amnesia: Secondary | ICD-10-CM

## 2015-04-21 MED ORDER — TOPIRAMATE 25 MG PO TABS: 25.0000 mg | ORAL_TABLET | Freq: Every day | ORAL | Status: DC

## 2015-04-21 NOTE — Patient Instructions (Signed)
Tremor °A tremor is trembling or shaking that you cannot control. Most tremors affect the hands or arms. Tremors can also affect the head, vocal cords, face, and other parts of the body. There are many types of tremors. Common types include:  °· Essential tremor. These usually occur in people over the age of 40. It may run in families and can happen in otherwise healthy people.   °· Resting tremor. These occur when the muscles are at rest, such as when your hands are resting in your lap. People with Parkinson disease often have resting tremors.   °· Postural tremor. These occur when you try to hold a pose, such as keeping your hands outstretched.   °· Kinetic tremor. These occur during purposeful movement, such as trying to touch a finger to your nose.   °· Task-specific tremor. These may occur when you perform tasks such as handwriting, speaking, or standing.   °· Psychogenic tremor. These dramatically lessen or disappear when you are distracted. They can happen in people of all ages.   °Some types of tremors have no known cause. Tremors can also be a symptom of nervous system problems (neurological disorders) that may occur with aging. Some tremors go away with treatment while others do not.  °HOME CARE INSTRUCTIONS °Watch your tremor for any changes. The following actions may help to lessen any discomfort you are feeling: °· Take medicines only as directed by your health care provider.   °· Limit alcohol intake to no more than 1 drink per day for nonpregnant women and 2 drinks per day for men. One drink equals 12 oz of beer, 5 oz of wine, or 1½ oz of hard liquor. °· Do not use any tobacco products, including cigarettes, chewing tobacco, or electronic cigarettes. If you need help quitting, ask your health care provider.   °· Avoid extreme heat or cold.    °· Limit the amount of caffeine you consume as directed by your health care provider.   °· Try to get 8 hours of sleep each night. °· Find ways to manage your  stress, such as meditation or yoga. °· Keep all follow-up visits as directed by your health care provider. This is important. °SEEK MEDICAL CARE IF: °· You start having a tremor after starting a new medicine. °· You have tremor with other symptoms such as: °¨ Numbness. °¨ Tingling. °¨ Pain. °¨ Weakness. °· Your tremor gets worse. °· Your tremor interferes with your day-to-day life. °  °This information is not intended to replace advice given to you by your health care provider. Make sure you discuss any questions you have with your health care provider. °  °Document Released: 01/21/2002 Document Revised: 02/21/2014 Document Reviewed: 07/29/2013 °Elsevier Interactive Patient Education ©2016 [employer] ° °

## 2015-04-21 NOTE — Progress Notes (Signed)
Reason for visit: Tremor  Inis "[name]" [name] is an 75 y.o. female  History of present illness:  Ms. [name] is a 75 year old right-handed white female with a history of a tremor, treated with methazolamide. The patient also has migraine headaches, she recently had a headache that lasted about 3 days. She has significant depression, she is considering ketamine treatments or even ECT treatments. She indicates that her right foot may feel cold at night. She continues to have some mild issues with memory that have not significantly progressed since last seen. She mainly has some issues with word finding problems and remembering names. The patient operates a motor vehicle without difficulty. She comes back to the office today for an evaluation.  Past Medical History  Diagnosis Date  . Fibromyalgia   . Migraines   . Hypertension   . Hyperlipidemia   . GERD (gastroesophageal reflux disease)   . Asthma   . Depression   . Diverticulosis   . Occasional tremors   . Chronic kidney disease     CKD Stage 3  . Essential tremor 05/17/2012  . Memory loss 05/17/2012  . Chronic renal insufficiency, stage III (moderate)   . Anxiety   . Arthritis   . Pneumonia   . ADD (attention deficit disorder)   . Dermatitis     Past Surgical History  Procedure Laterality Date  . Total hip arthroplasty Bilateral 2009    Dr Adriana Mccallum  . Appendectomy    . Tonsillectomy and adenoidectomy      age 45  . Abdominal hysterectomy    . Cataract extraction Bilateral     Family History  Problem Relation Age of Onset  . Stroke Maternal Grandmother   . Heart failure Maternal Grandfather   . Emphysema Maternal Grandfather   . Congestive Heart Failure Maternal Grandfather   . Coronary artery disease Paternal Grandmother   . Coronary artery disease Paternal Grandfather   . Liver cancer Mother   . Pulmonary embolism Mother   . Prostate cancer Father   . Prostate cancer Paternal Uncle     x 2  . ALS  Paternal Aunt   . Brain cancer Paternal Aunt   . Breast cancer Paternal Aunt   . Lung cancer Maternal Aunt   . Depression Brother     suicide  . Diabetes Paternal Aunt     x 2    Social history:  reports that she has never smoked. She has never used smokeless tobacco. She reports that she does not drink alcohol or use illicit drugs.    Allergies  Allergen Reactions  . Ciprofloxacin Nausea Only  . Erythromycin Other (See Comments)    Stomach burning  . Nitrofuran Derivatives Nausea Only  . Sulfa Antibiotics Nausea Only  . Latex Rash  . Shellfish Allergy Rash    Medications:  Prior to Admission medications   Medication Sig Start Date End Date Taking? Authorizing Provider  Coenzyme Q10 (CO Q 10 PO) Take 1 tablet by mouth daily.   Yes Historical Provider, MD  DULoxetine (CYMBALTA) 30 MG capsule Take 30 mg by mouth daily.   Yes Historical Provider, MD  ergocalciferol (VITAMIN D2) 50000 UNITS capsule Take 50,000 Units by mouth once a week.   Yes Historical Provider, MD  Flunisolide HFA (AEROSPAN) 80 MCG/ACT AERS Inhale 2 puffs into the lungs 2 (two) times daily. 03/04/15  Yes Rigoberto Noel, MD  HYDROcodone-acetaminophen (NORCO) 7.5-325 MG per tablet Take 1 tablet by mouth 2 (  two) times daily. For pain   Yes Historical Provider, MD  hydrocortisone 2.5 % cream daily as needed. 09/19/14  Yes Historical Provider, MD  hyoscyamine (LEVSIN/SL) 0.125 MG SL tablet Place 1 tablet (0.125 mg total) under the tongue every 6 (six) hours as needed. 12/29/14  Yes Mauri Pole, MD  levothyroxine (SYNTHROID, LEVOTHROID) 50 MCG tablet Once daily 08/07/13  Yes Historical Provider, MD  LORazepam (ATIVAN) 1 MG tablet Take 1 tablet by mouth at bedtime. 05/17/14  Yes Historical Provider, MD  losartan (COZAAR) 100 MG tablet Take 100 mg by mouth daily.   Yes Historical Provider, MD  methazolamide (NEPTAZANE) 25 MG tablet TAKE 1 TABLET BY MOUTH EVERY DAY 12/01/14  Yes Kathrynn Ducking, MD  mineral oil liquid  Take 15 mLs by mouth daily as needed for moderate constipation.   Yes Historical Provider, MD  Multiple Vitamins-Minerals (ICAPS PO) Take 1 tablet by mouth daily.   Yes Historical Provider, MD  omeprazole (PRILOSEC) 20 MG capsule Take 1 capsule by mouth 2 (two) times daily before a meal. 04/01/14  Yes Historical Provider, MD  polyethylene glycol (MIRALAX / GLYCOLAX) packet Take 17 g by mouth daily.   Yes Historical Provider, MD  pregabalin (LYRICA) 50 MG capsule Take 50 mg by mouth daily.    Yes Historical Provider, MD  PROAIR HFA 108 (90 BASE) MCG/ACT inhaler INHALE TWO PUFFS BY MOUTH EVERY 4 HOURS AS NEEDED FOR WHEEZING OR  SHORTNESS  OF  BREATH 08/01/14  Yes Rigoberto Noel, MD  SUMAtriptan (IMITREX) 50 MG tablet Take 50 mg by mouth every 2 (two) hours as needed. For migraine   Yes Historical Provider, MD    ROS:  Out of a complete 14 system review of symptoms, the patient complains only of the following symptoms, and all other reviewed systems are negative.  Fatigue, excessive sweating Hearing loss, ringing in ears, runny nose Heat intolerance, flushing Constipation Environmental allergies Incontinence of the bladder, frequency of urination, joint pain, achy muscles Memory loss, headache, tremors, depression  Blood pressure 128/78, pulse 82, resp. rate 20, height 5\' 4"  (1.626 m), weight 166 lb (75.297 kg).  Physical Exam  General: The patient is alert and cooperative at the time of the examination.  Skin: No significant peripheral edema is noted.   Neurologic Exam  Mental status: The patient is alert and oriented x 3 at the time of the examination. The patient has apparent normal recent and remote memory, with an apparently normal attention span and concentration ability. The Mini-Mental Status Examination done today shows a total score 30/30. The patient is able to name 15 animals in 30 seconds.   Cranial nerves: Facial symmetry is present. Speech is normal, no aphasia or  dysarthria is noted. Extraocular movements are full. Visual fields are full.  Motor: The patient has good strength in all 4 extremities.  Sensory examination: Soft touch sensation is symmetric on the face, arms, and legs.  Coordination: The patient has good finger-nose-finger and heel-to-shin bilaterally.  Gait and station: The patient has a normal gait. Tandem gait is normal. Romberg is negative. No drift is seen.  Reflexes: Deep tendon reflexes are symmetric.   MRI brain 01/16/15:  IMPRESSION: This MRI of the brain with and without contrast shows the following: 1. Mild cortical atrophy that is probably within normal limits for age. 2. Scattered T2/FLAIR hyperintense foci in the subcortical deep white matter of both hemispheres consistent with chronic microvascular ischemic changes. The extent is mildly more than expected  for age. When compared to the MRI dated 11/17/2008, some of these have developed during the interim, though none appears to be acute on the current study. 3. There are no acute findings.  * MRI scan images were reviewed online. I agree with the written report.    Assessment/Plan:  1. Tremor  2. Migraine  3. Mild memory disturbance  4. Depression  The patient reports a problem with memory mainly involving word finding problems. The patient is on methazolamide, this could potentially be the source of her issues. The patient does have a history of migraine headaches, the methazolamide also helps the tremor. We will switch to Topamax as this may be a cheaper option for her. The patient will follow-up in one year, sooner if needed. She will contact me if she needs a dose adjustment of the Topamax.  Jill Alexanders MD 04/21/2015 6:39 PM  [facility] [address] [place], [place] [address]  Phone [phone] Fax [phone]

## 2015-05-19 ENCOUNTER — Other Ambulatory Visit: Payer: Self-pay | Admitting: Gastroenterology

## 2015-05-21 ENCOUNTER — Other Ambulatory Visit: Payer: Self-pay

## 2015-05-21 ENCOUNTER — Telehealth: Payer: Self-pay | Admitting: Neurology

## 2015-05-21 DIAGNOSIS — Z1231 Encounter for screening mammogram for malignant neoplasm of breast: Secondary | ICD-10-CM

## 2015-05-21 MED ORDER — ACETAZOLAMIDE 125 MG PO TABS: 125.0000 mg | ORAL_TABLET | Freq: Two times a day (BID) | ORAL | Status: DC

## 2015-05-21 NOTE — Telephone Encounter (Signed)
I called patient. She does not feel well on the Topamax, his cognitive clouding. We will try Diamox instead for the tremor and for the migraine. We will start in low dose.

## 2015-05-21 NOTE — Telephone Encounter (Signed)
Patient called to advise, she is having side effects on topiramate (TOPAMAX) 25 MG tablet, "real dizzy, balance is off, affecting memory, alertness", states she switched to Topomax because it was cheaper but would rather go back to METHAZOLAMIDE if okay with Dr. Jannifer Franklin. Please call to advise.

## 2015-05-22 ENCOUNTER — Ambulatory Visit: Payer: Medicare Other | Admitting: Gastroenterology

## 2015-05-28 DIAGNOSIS — N390 Urinary tract infection, site not specified: Secondary | ICD-10-CM | POA: Diagnosis not present

## 2015-06-12 ENCOUNTER — Ambulatory Visit: Payer: Medicare Other

## 2015-06-23 DIAGNOSIS — I10 Essential (primary) hypertension: Secondary | ICD-10-CM | POA: Diagnosis not present

## 2015-06-23 DIAGNOSIS — Z79899 Other long term (current) drug therapy: Secondary | ICD-10-CM | POA: Diagnosis not present

## 2015-06-23 DIAGNOSIS — M797 Fibromyalgia: Secondary | ICD-10-CM | POA: Diagnosis not present

## 2015-06-23 DIAGNOSIS — Z6830 Body mass index (BMI) 30.0-30.9, adult: Secondary | ICD-10-CM | POA: Diagnosis not present

## 2015-06-23 DIAGNOSIS — Z1389 Encounter for screening for other disorder: Secondary | ICD-10-CM | POA: Diagnosis not present

## 2015-06-23 DIAGNOSIS — Z0000 Encounter for general adult medical examination without abnormal findings: Secondary | ICD-10-CM | POA: Diagnosis not present

## 2015-06-25 ENCOUNTER — Ambulatory Visit
Admission: RE | Admit: 2015-06-25 | Discharge: 2015-06-25 | Disposition: A | Discharge status: Home / Self Care | Payer: Medicare Other | Source: Ambulatory Visit

## 2015-06-25 DIAGNOSIS — Z1231 Encounter for screening mammogram for malignant neoplasm of breast: Secondary | ICD-10-CM | POA: Diagnosis not present

## 2015-07-06 DIAGNOSIS — B0089 Other herpesviral infection: Secondary | ICD-10-CM | POA: Diagnosis not present

## 2015-07-06 DIAGNOSIS — L98411 Non-pressure chronic ulcer of buttock limited to breakdown of skin: Secondary | ICD-10-CM | POA: Diagnosis not present

## 2015-07-20 DIAGNOSIS — L239 Allergic contact dermatitis, unspecified cause: Secondary | ICD-10-CM | POA: Diagnosis not present

## 2015-07-20 DIAGNOSIS — L304 Erythema intertrigo: Secondary | ICD-10-CM | POA: Diagnosis not present

## 2015-07-27 ENCOUNTER — Ambulatory Visit (INDEPENDENT_AMBULATORY_CARE_PROVIDER_SITE_OTHER): Payer: Medicare Other | Admitting: Gastroenterology

## 2015-07-27 ENCOUNTER — Encounter: Payer: Self-pay | Admitting: Gastroenterology

## 2015-07-27 ENCOUNTER — Telehealth: Payer: Self-pay | Admitting: Pulmonary Disease

## 2015-07-27 VITALS — BP 124/76 | HR 88 | Ht 63.75 in | Wt 170.5 lb

## 2015-07-27 DIAGNOSIS — K5909 Other constipation: Secondary | ICD-10-CM

## 2015-07-27 DIAGNOSIS — R1310 Dysphagia, unspecified: Secondary | ICD-10-CM

## 2015-07-27 DIAGNOSIS — Z86010 Personal history of colonic polyps: Secondary | ICD-10-CM | POA: Diagnosis not present

## 2015-07-27 DIAGNOSIS — K589 Irritable bowel syndrome without diarrhea: Secondary | ICD-10-CM

## 2015-07-27 MED ORDER — NA SULFATE-K SULFATE-MG SULF 17.5-3.13-1.6 GM/177ML PO SOLN: 1.0000 | Freq: Once | ORAL | Status: DC

## 2015-07-27 NOTE — Progress Notes (Signed)
[address]    [name]    1940/04/01  Primary Care Physician:Rachel Marcello Moores, MD  Referring Physician: Lajean Manes, MD [address] [place], [place] [address]  Chief complaint: Dysphagia, IBS HPI: 75 year old female with h/o IBS predominant chronic constipation is here for follow-up visit. She has had extensive GI workup in the past which includes HIDA scan that was normal with ejection fraction of 80%. Gastric emptying scan was also normal with 91% emptying in 2 hours. Last upper endoscopy by Dr. Cristina Choi in July 2012 was normal. Colonoscopy in July 2012 was also normal. Prior colonoscopy in 2003 with sessile serrated polyp that was removed with biopsy and on her last colonoscopy report in 2012 was recommended surveillance colonoscopy in 5 years. She is having 2-3 soft bowel movements a day with MiraLAX daily. On review of systems complained of intermittent dysphagia with meat and bread for past few years, she feels its slow to go down and sometimes gets hung up but eventually goes down when she drinks water. No difficulty with pills or liquids. Denies any nausea, vomiting, abdominal pain, melena or bright red blood per rectum   Outpatient Encounter Prescriptions as of 07/27/2015  Medication Sig  . acetaZOLAMIDE (DIAMOX) 125 MG tablet Take 1 tablet (125 mg total) by mouth 2 (two) times daily.  Marland Kitchen aspirin 81 MG tablet Take 81 mg by mouth daily.  . Coenzyme Q10 (CO Q 10 PO) Take 1 tablet by mouth daily.  . DULoxetine (CYMBALTA) 30 MG capsule Take 30 mg by mouth daily.  . ergocalciferol (VITAMIN D2) 50000 UNITS capsule Take 50,000 Units by mouth once a week.  . Flunisolide HFA (AEROSPAN) 80 MCG/ACT AERS Inhale 2 puffs into the lungs 2 (two) times daily.  Marland Kitchen HYDROcodone-acetaminophen (NORCO) 7.5-325 MG per tablet Take 1 tablet by mouth 2 (two) times daily. For pain  . hydrocortisone 2.5 % cream daily as needed.  . hyoscyamine (LEVSIN SL) 0.125 MG  SL tablet DISSOLVE ONE TABLET UNDER THE TONGUE EVERY 6 HOURS AS NEEDED  . levothyroxine (SYNTHROID, LEVOTHROID) 50 MCG tablet Once daily  . LORazepam (ATIVAN) 1 MG tablet Take 1 tablet by mouth at bedtime.  Marland Kitchen losartan (COZAAR) 100 MG tablet Take 100 mg by mouth daily.  . Multiple Vitamins-Minerals (ICAPS PO) Take 1 tablet by mouth daily.  Marland Kitchen omeprazole (PRILOSEC) 20 MG capsule Take 1 capsule by mouth 2 (two) times daily before a meal.  . polyethylene glycol (MIRALAX / GLYCOLAX) packet Take 17 g by mouth daily.  . pregabalin (LYRICA) 50 MG capsule Take 50 mg by mouth daily.   Marland Kitchen PROAIR HFA 108 (90 BASE) MCG/ACT inhaler INHALE TWO PUFFS BY MOUTH EVERY 4 HOURS AS NEEDED FOR WHEEZING OR  SHORTNESS  OF  BREATH  . SUMAtriptan (IMITREX) 50 MG tablet Take 50 mg by mouth every 2 (two) hours as needed. For migraine  . [DISCONTINUED] mineral oil liquid Take 15 mLs by mouth daily as needed for moderate constipation.   No facility-administered encounter medications on file as of 07/27/2015.    Allergies as of 07/27/2015 - Review Complete 07/27/2015  Allergen Reaction Noted  . Ciprofloxacin Nausea Only 12/12/2011  . Erythromycin Other (See Comments)   . Nitrofuran derivatives Nausea Only 12/29/2014  . Sulfa antibiotics Nausea Only 12/12/2011  . Latex Rash 03/03/2012  . Shellfish allergy Rash 03/03/2012    Past Medical History  Diagnosis Date  . Fibromyalgia   . Migraines   .  Hypertension   . Hyperlipidemia   . GERD (gastroesophageal reflux disease)   . Asthma   . Depression   . Diverticulosis   . Occasional tremors   . Chronic kidney disease     CKD Stage 3  . Essential tremor 05/17/2012  . Memory loss 05/17/2012  . Chronic renal insufficiency, stage III (moderate)   . Anxiety   . Arthritis   . Pneumonia   . ADD (attention deficit disorder)   . Dermatitis     Past Surgical History  Procedure Laterality Date  . Total hip arthroplasty Bilateral 2009    Dr [name]  . Appendectomy    .  Tonsillectomy and adenoidectomy      age 46  . Abdominal hysterectomy    . Cataract extraction Bilateral     Family History  Problem Relation Age of Onset  . Stroke Maternal Grandmother   . Heart failure Maternal Grandfather   . Emphysema Maternal Grandfather   . Congestive Heart Failure Maternal Grandfather   . Coronary artery disease Paternal Grandmother   . Coronary artery disease Paternal Grandfather   . Liver cancer Mother   . Pulmonary embolism Mother   . Prostate cancer Father   . Prostate cancer Paternal Uncle     x 2  . ALS Paternal Aunt   . Brain cancer Paternal Aunt   . Breast cancer Paternal Aunt   . Lung cancer Maternal Aunt   . Depression Brother     suicide  . Diabetes Paternal Aunt     x 2    Social History   Social History  . Marital Status: Married    Spouse Name: [name]  . Number of Children: 2  . Years of Education: hs   Occupational History  . Retired   .     Social History Main Topics  . Smoking status: Never Smoker   . Smokeless tobacco: Never Used  . Alcohol Use: No  . Drug Use: No  . Sexual Activity: No   Other Topics Concern  . Not on file   Social History Narrative   Patient lives at home with her husband [name]).    Retired.   Education high school.   Right handed.   Caffeine two cups of coffee daily and one glass of sweet tea.      Review of systems: Review of Systems  Constitutional: Negative for fever and chills.  HENT: Negative.   Eyes: Negative for blurred vision.  Respiratory: Negative for cough, shortness of breath and wheezing.   Cardiovascular: Negative for chest pain and palpitations.  Gastrointestinal: as per HPI Genitourinary: Negative for dysuria, urgency, frequency and hematuria.  Musculoskeletal: Negative for myalgias, back pain and joint pain.  Skin: Negative for itching and rash.  Neurological: Negative for dizziness, tremors, focal weakness, seizures and loss of consciousness.  Endo/Heme/Allergies:  Negative for environmental allergies.  Psychiatric/Behavioral: Negative for depression, suicidal ideas and hallucinations.  All other systems reviewed and are negative.   Physical Exam: Filed Vitals:   07/27/15 1422  BP: 124/76  Pulse: 88   Gen:      No acute distress HEENT:  EOMI, sclera anicteric Neck:     No masses; no thyromegaly Lungs:    Clear to auscultation bilaterally; normal respiratory effort CV:         Regular rate and rhythm; no murmurs Abd:      + bowel sounds; soft, non-tender; no palpable masses, no distension Ext:    No  edema; adequate peripheral perfusion Skin:      Warm and dry; no rash Neuro: alert and oriented x 3 Psych: normal mood and affect  Data Reviewed: Reviewed chart in epic   Assessment and Plan/Recommendations:  75 year old female with history of irritable bowel syndrome predominant constipation here for follow-up visit Currently symptoms well controlled with MiraLAX daily We'll schedule for EGD with possible dilation for intermittent dysphagia Based on her last colonoscopy report in 2012 she is due for recall colonoscopy given her personal history of polyps, we'll try to obtain the records Probiotic  VSL#3 112 B u daily for 4 weeks Return as needed   K. Denzil Magnuson , MD [phone] Mon-Fri 8a-5p [phone] after 5p, weekends, holidays  CC: Rachel Manes, MD

## 2015-07-27 NOTE — Telephone Encounter (Signed)
We do not have samples of Aerospan. lmtcb x1 for pt.

## 2015-07-27 NOTE — Patient Instructions (Addendum)
You have been scheduled for an endoscopy and colonoscopy. Please follow the written instructions given to you at your visit today. Please pick up your prep supplies at the pharmacy within the next 1-3 days. If you use inhalers (even only as needed), please bring them with you on the day of your procedure. Your physician has requested that you go to www.startemmi.com and enter the access code given to you at your visit today. This web site gives a general overview about your procedure. However, you should still follow specific instructions given to you by our office regarding your preparation for the procedure.   Use VSL # 3 112 units daily for 1 month, this can be purchased over the counter Use Miralax as needed

## 2015-07-28 NOTE — Telephone Encounter (Signed)
LMTCB

## 2015-07-29 NOTE — Telephone Encounter (Signed)
LMTCB

## 2015-07-29 NOTE — Telephone Encounter (Signed)
Pt calling back [phone]

## 2015-07-29 NOTE — Telephone Encounter (Signed)
I spoke with pt and made her aware no samples  Nothing further needed

## 2015-08-07 ENCOUNTER — Telehealth: Payer: Self-pay | Admitting: Gastroenterology

## 2015-08-07 NOTE — Telephone Encounter (Signed)
Offered Moviprep. She will check with Dr Felipa Eth and let me know next week.

## 2015-08-10 ENCOUNTER — Other Ambulatory Visit: Payer: Self-pay

## 2015-08-10 ENCOUNTER — Telehealth: Payer: Self-pay | Admitting: Gastroenterology

## 2015-08-10 NOTE — Telephone Encounter (Signed)
New instructions and sample of the prep MOVIPREP at the front desk for the patient pick up.

## 2015-08-20 ENCOUNTER — Other Ambulatory Visit: Payer: Self-pay | Admitting: Pulmonary Disease

## 2015-08-20 ENCOUNTER — Telehealth: Payer: Self-pay | Admitting: Pulmonary Disease

## 2015-08-20 NOTE — Telephone Encounter (Signed)
Spoke with pt. She is aware that we no longer get samples of this medication. Pt verbalized understanding. Nothing further was needed.

## 2015-08-24 ENCOUNTER — Other Ambulatory Visit: Payer: Self-pay | Admitting: Neurology

## 2015-08-25 ENCOUNTER — Telehealth: Payer: Self-pay | Admitting: Pulmonary Disease

## 2015-08-25 NOTE — Telephone Encounter (Signed)
lmtcb X1 for pt  

## 2015-08-26 NOTE — Telephone Encounter (Signed)
Called and spoke with pt.  She stated that the Jeralyn Ruths is costing her $200 out of pocket every month and she cannot afford this.  She is having to use her rescue inhaler about 2 times per day.  Pt stated that she would like to try another medication or we can contact her insurance company to see about a tier exception for that medication.  RA please advise.

## 2015-08-27 ENCOUNTER — Encounter: Payer: PRIVATE HEALTH INSURANCE | Admitting: Gastroenterology

## 2015-08-27 MED ORDER — BUDESONIDE 180 MCG/ACT IN AEPB: 2.0000 | INHALATION_SPRAY | Freq: Two times a day (BID) | RESPIRATORY_TRACT | Status: DC

## 2015-08-27 NOTE — Telephone Encounter (Signed)
Pt says something has to be done today. Terrible coughing spells, cant breath, had to use inhaler. Needs a less expensive medication to prevent it.

## 2015-08-27 NOTE — Telephone Encounter (Signed)
Spoke with pt, aware of recs.  rx sent to preferred pharmacy, med list updated to reflect change.  Nothing further needed.

## 2015-08-27 NOTE — Telephone Encounter (Signed)
Offer Pulmicort HFA   # 1, inhale 2 puffs then rinse mouth, twice daily    Refill x 5

## 2015-08-27 NOTE — Telephone Encounter (Signed)
Spoke with pt and she states that since she could not afford the [place] she has been without it for a month. She is having increased asthma symptoms and is using her rescue inhaler several times a day. I reviewed rescue inhaler use just to make sure. Also advised pt that she may want to call her ins co and ask what their preferred alternative to Jeralyn Ruths is so that we can know what needs to be prescribed to replace it. Pt will call ins co today and call us back.  Holding in triage for pt return call with inhaler information.

## 2015-08-27 NOTE — Telephone Encounter (Signed)
Patient called [employer] and she was told there were only two meds they would cover in place of Aerospan.  Two medications are Asthamanex and Pulmicort.  CB is [phone].

## 2015-08-27 NOTE — Telephone Encounter (Signed)
Spoke with pt, states that asmanex and pulmicort are her two covered alternatives.  Sending to DOD as pt is requesting a rx asap.   CY please advise.  Thanks!   Last ov: 04/13/15 with RA Next ov: 01/11/16 with TP  Allergies  Allergen Reactions  . Ciprofloxacin Nausea Only  . Erythromycin Other (See Comments)    Stomach burning  . Nitrofuran Derivatives Nausea Only  . Sulfa Antibiotics Nausea Only  . Latex Rash  . Shellfish Allergy Rash   Current Outpatient Prescriptions on File Prior to Visit  Medication Sig Dispense Refill  . acetaZOLAMIDE (DIAMOX) 125 MG tablet TAKE ONE TABLET BY MOUTH TWICE DAILY 180 tablet 3  . aspirin 81 MG tablet Take 81 mg by mouth daily.    . Coenzyme Q10 (CO Q 10 PO) Take 1 tablet by mouth daily.    . DULoxetine (CYMBALTA) 30 MG capsule Take 30 mg by mouth daily.    . ergocalciferol (VITAMIN D2) 50000 UNITS capsule Take 50,000 Units by mouth once a week.    . Flunisolide HFA (AEROSPAN) 80 MCG/ACT AERS Inhale 2 puffs into the lungs 2 (two) times daily. 8.9 g 0  . HYDROcodone-acetaminophen (NORCO) 7.5-325 MG per tablet Take 1 tablet by mouth 2 (two) times daily. For pain    . hydrocortisone 2.5 % cream daily as needed.    . hyoscyamine (LEVSIN SL) 0.125 MG SL tablet DISSOLVE ONE TABLET UNDER THE TONGUE EVERY 6 HOURS AS NEEDED 30 tablet 0  . levothyroxine (SYNTHROID, LEVOTHROID) 50 MCG tablet Once daily    . LORazepam (ATIVAN) 1 MG tablet Take 1 tablet by mouth at bedtime.    Marland Kitchen losartan (COZAAR) 100 MG tablet Take 100 mg by mouth daily.    . Multiple Vitamins-Minerals (ICAPS PO) Take 1 tablet by mouth daily.    . Na Sulfate-K Sulfate-Mg Sulf (SUPREP BOWEL PREP KIT) 17.5-3.13-1.6 GM/180ML SOLN Take 1 kit by mouth once. 1 Bottle 0  . omeprazole (PRILOSEC) 20 MG capsule Take 1 capsule by mouth 2 (two) times daily before a meal.    . polyethylene glycol (MIRALAX / GLYCOLAX) packet Take 17 g by mouth daily.    . pregabalin (LYRICA) 50 MG capsule Take 50 mg by mouth  daily.     Marland Kitchen PROAIR HFA 108 (90 Base) MCG/ACT inhaler INHALE TWO PUFFS BY MOUTH EVERY 4 HOURS AS NEEDED FOR WHEEZING OR SHORTNESS OF BREATH 9 each 0  . SUMAtriptan (IMITREX) 50 MG tablet Take 50 mg by mouth every 2 (two) hours as needed. For migraine     No current facility-administered medications on file prior to visit.

## 2015-08-31 ENCOUNTER — Encounter: Payer: Self-pay | Admitting: Pulmonary Disease

## 2015-08-31 ENCOUNTER — Ambulatory Visit (INDEPENDENT_AMBULATORY_CARE_PROVIDER_SITE_OTHER): Payer: Medicare Other | Admitting: Pulmonary Disease

## 2015-08-31 VITALS — BP 142/84 | HR 91 | Ht 64.0 in | Wt 171.0 lb

## 2015-08-31 DIAGNOSIS — J4530 Mild persistent asthma, uncomplicated: Secondary | ICD-10-CM

## 2015-08-31 DIAGNOSIS — J4541 Moderate persistent asthma with (acute) exacerbation: Secondary | ICD-10-CM

## 2015-08-31 LAB — NITRIC OXIDE: Nitric Oxide: 178

## 2015-08-31 MED ORDER — FLUTICASONE FUROATE-VILANTEROL 100-25 MCG/INH IN AEPB: 1.0000 | INHALATION_SPRAY | Freq: Every day | RESPIRATORY_TRACT | Status: DC

## 2015-08-31 MED ORDER — PREDNISONE 10 MG PO TABS: ORAL_TABLET | ORAL | Status: DC

## 2015-08-31 MED ORDER — FLUTICASONE PROPIONATE 50 MCG/ACT NA SUSP: 2.0000 | Freq: Every day | NASAL | Status: DC

## 2015-08-31 NOTE — Patient Instructions (Signed)
Breo one puff daily >> rinse mouth after each use Prednisone 10 mg pill >> 4 pills daily for 2 days, 3 pills daily for 2 days, 2 pills daily for 2 days, 1 pill daily for 2 days Flonase two sprays each nostril daily Albuterol two puffs every 6 hours as needed for cough, wheeze, or chest congestion  Follow up with Dr. Elsworth Soho or Nurse Practitioner in 2 weeks

## 2015-08-31 NOTE — Progress Notes (Signed)
Current Outpatient Prescriptions on File Prior to Visit  Medication Sig  . acetaZOLAMIDE (DIAMOX) 125 MG tablet TAKE ONE TABLET BY MOUTH TWICE DAILY  . aspirin 81 MG tablet Take 81 mg by mouth daily.  . Coenzyme Q10 (CO Q 10 PO) Take 1 tablet by mouth daily.  . DULoxetine (CYMBALTA) 30 MG capsule Take 30 mg by mouth daily.  . ergocalciferol (VITAMIN D2) 50000 UNITS capsule Take 50,000 Units by mouth once a week.  Marland Kitchen HYDROcodone-acetaminophen (NORCO) 7.5-325 MG per tablet Take 1 tablet by mouth 2 (two) times daily. For pain  . hydrocortisone 2.5 % cream daily as needed.  . hyoscyamine (LEVSIN SL) 0.125 MG SL tablet DISSOLVE ONE TABLET UNDER THE TONGUE EVERY 6 HOURS AS NEEDED  . levothyroxine (SYNTHROID, LEVOTHROID) 50 MCG tablet Once daily  . LORazepam (ATIVAN) 1 MG tablet Take 1 tablet by mouth at bedtime.  Marland Kitchen losartan (COZAAR) 100 MG tablet Take 100 mg by mouth daily.  . Multiple Vitamins-Minerals (ICAPS PO) Take 1 tablet by mouth daily.  . Na Sulfate-K Sulfate-Mg Sulf (SUPREP BOWEL PREP KIT) 17.5-3.13-1.6 GM/180ML SOLN Take 1 kit by mouth once.  Marland Kitchen omeprazole (PRILOSEC) 20 MG capsule Take 1 capsule by mouth 2 (two) times daily before a meal.  . polyethylene glycol (MIRALAX / GLYCOLAX) packet Take 17 g by mouth daily.  . pregabalin (LYRICA) 50 MG capsule Take 50 mg by mouth daily.   Marland Kitchen PROAIR HFA 108 (90 Base) MCG/ACT inhaler INHALE TWO PUFFS BY MOUTH EVERY 4 HOURS AS NEEDED FOR WHEEZING OR SHORTNESS OF BREATH  . SUMAtriptan (IMITREX) 50 MG tablet Take 50 mg by mouth every 2 (two) hours as needed. For migraine   No current facility-administered medications on file prior to visit.     Chief Complaint  Patient presents with  . Acute Visit    RA pt: Pt c/o cough, chest congestion, wheezing and SOB x 1.5 weeks ago. LIttle mucus production, strange metallic taste. Not using any OTC meds for symptoms. Using all inhalers as directed and Tussionex cough syrup. Denies f/c/s/n/v.      Pulmonary  tests PFT 06/26/12 >> FEV1 2.68, FEV1% 70, TLC 5.82 (118%), DLCO 84%  Past medical hx Fibromyalgia, Depression, Anxiety, ADD, Migraine HA, HTN, HLD, GERD, CKD 3, Essential tremor  Past surgical hx, Allergies, Family hx, Social hx all reviewed.  Vital Signs BP 142/84 mmHg  Pulse 91  Ht _0  (1.626 m)  Wt 171 lb (77.565 kg)  BMI 29.34 kg/m2  SpO2 97%  History of Present Illness [name] is a 75 y.o. female with asthma.  She is followed by Dr. Elsworth Soho.  Her inhaler was expensive.  She tried stopping this, but then her breathing got worse.  She is wheeze in her throat and congestion in her throat.  She is also getting discomfort and tightness in her chest and back.  She denies fever.  She has chronic eczema on her hands >> has seen dermatology before.  No leg or gland swelling.  No sick exposures.  She has been using albuterol and this helps.  FeNO >> 178.   Physical Exam  General - No distress ENT - No sinus tenderness, boggy nasal mucosa, clear nasal drainage, no oral exudate, no LAN Cardiac - s1s2 regular, no murmur Chest - decreased air movement, no wheeze Back - No focal tenderness Abd - Soft, non-tender Ext - No edema Neuro - Normal strength Skin - No rashes Psych - normal mood, and behavior  Assessment/Plan  Acute asthma exacerbation likely from post-nasal drip. - Will have her try breo - Will have her use flonase - Will give taper of prednisone - continue prn albuterol - defer CXR, or Abx at this time   Patient Instructions  Breo one puff daily >> rinse mouth after each use Prednisone 10 mg pill >> 4 pills daily for 2 days, 3 pills daily for 2 days, 2 pills daily for 2 days, 1 pill daily for 2 days Flonase two sprays each nostril daily Albuterol two puffs every 6 hours as needed for cough, wheeze, or chest congestion  Follow up with Dr. Elsworth Soho or Nurse Practitioner in 2 weeks     Chesley Mires, MD [facility] Pager:  [phone] 08/31/2015, 3:58 PM

## 2015-09-07 ENCOUNTER — Other Ambulatory Visit: Payer: Self-pay | Admitting: Gastroenterology

## 2015-09-07 DIAGNOSIS — M47896 Other spondylosis, lumbar region: Secondary | ICD-10-CM | POA: Diagnosis not present

## 2015-09-07 DIAGNOSIS — M5441 Lumbago with sciatica, right side: Secondary | ICD-10-CM | POA: Diagnosis not present

## 2015-09-07 DIAGNOSIS — Z96643 Presence of artificial hip joint, bilateral: Secondary | ICD-10-CM | POA: Diagnosis not present

## 2015-09-07 DIAGNOSIS — M5136 Other intervertebral disc degeneration, lumbar region: Secondary | ICD-10-CM | POA: Diagnosis not present

## 2015-09-17 ENCOUNTER — Ambulatory Visit (INDEPENDENT_AMBULATORY_CARE_PROVIDER_SITE_OTHER): Payer: Medicare Other | Admitting: Adult Health

## 2015-09-17 ENCOUNTER — Encounter: Payer: Self-pay | Admitting: Adult Health

## 2015-09-17 DIAGNOSIS — J4530 Mild persistent asthma, uncomplicated: Secondary | ICD-10-CM

## 2015-09-17 NOTE — Patient Instructions (Addendum)
Continue on BREO 1 puff daily , rinse well after use.  Follow up with Dr. Elsworth Soho  4 months and As needed

## 2015-09-18 NOTE — Progress Notes (Signed)
Subjective:    Patient ID: [name] "HIDAYA DANIEL, female    DOB: 1940-07-26, 75 y.o.   MRN: [id number]  HPI  75 years old never smoker for FU of asthma She has multiple medical problems including movement disorder, hypertension dyslipidemia , asthma, depression and fibromyalgia Initial pulmonary consult 05/24/12    06/26/12 PFT today showed Nml FEV1 at 114% , min drop in ratio at 63, Mild obstruction in mid flows at 58% w/ 20% BD response.       09/17/15  Follow up Asthma  Patient returns for 2 week follow up .  Seen last ov for asthma flare , tx w/ steroid taper.  Says she is doing much better . No wheezing or cough flare.  Denies chest pain, orthopnea, edema or fever.   Past Medical History:  Diagnosis Date  . ADD (attention deficit disorder)   . Anxiety   . Arthritis   . Asthma   . Chronic kidney disease    CKD Stage 3  . Chronic renal insufficiency, stage III (moderate)   . Depression   . Dermatitis   . Diverticulosis   . Essential tremor 05/17/2012  . Fibromyalgia   . GERD (gastroesophageal reflux disease)   . Hyperlipidemia   . Hypertension   . Memory loss 05/17/2012  . Migraines   . Occasional tremors   . Pneumonia    Current Outpatient Prescriptions on File Prior to Visit  Medication Sig Dispense Refill  . acetaZOLAMIDE (DIAMOX) 125 MG tablet TAKE ONE TABLET BY MOUTH TWICE DAILY 180 tablet 3  . aspirin 81 MG tablet Take 81 mg by mouth daily.    . Coenzyme Q10 (CO Q 10 PO) Take 1 tablet by mouth daily.    . DULoxetine (CYMBALTA) 30 MG capsule Take 30 mg by mouth daily.    . ergocalciferol (VITAMIN D2) 50000 UNITS capsule Take 50,000 Units by mouth once a week.    . fluticasone (FLONASE) 50 MCG/ACT nasal spray Place 2 sprays into both nostrils daily. 16 g 2  . fluticasone furoate-vilanterol (BREO ELLIPTA) 100-25 MCG/INH AEPB Inhale 1 puff into the lungs daily. 1 each 5  . HYDROcodone-acetaminophen (NORCO) 7.5-325 MG per tablet Take 1 tablet by mouth 2 (two)  times daily. For pain    . hydrocortisone 2.5 % cream daily as needed.    . hyoscyamine (LEVSIN SL) 0.125 MG SL tablet DISSOLVE ONE TABLET UNDER THE TONGUE EVERY 6 HOURS AS NEEDED 30 tablet 0  . levothyroxine (SYNTHROID, LEVOTHROID) 50 MCG tablet Once daily    . LORazepam (ATIVAN) 1 MG tablet Take 1 tablet by mouth at bedtime.    Marland Kitchen losartan (COZAAR) 100 MG tablet Take 100 mg by mouth daily.    . Multiple Vitamins-Minerals (ICAPS PO) Take 1 tablet by mouth daily.    . Na Sulfate-K Sulfate-Mg Sulf (SUPREP BOWEL PREP KIT) 17.5-3.13-1.6 GM/180ML SOLN Take 1 kit by mouth once. 1 Bottle 0  . omeprazole (PRILOSEC) 20 MG capsule Take 1 capsule by mouth 2 (two) times daily before a meal.    . polyethylene glycol (MIRALAX / GLYCOLAX) packet Take 17 g by mouth daily.    . pregabalin (LYRICA) 50 MG capsule Take 50 mg by mouth daily.     Marland Kitchen PROAIR HFA 108 (90 Base) MCG/ACT inhaler INHALE TWO PUFFS BY MOUTH EVERY 4 HOURS AS NEEDED FOR WHEEZING OR SHORTNESS OF BREATH 9 each 0  . SUMAtriptan (IMITREX) 50 MG tablet Take 50 mg by mouth every 2 (two)  hours as needed. For migraine     No current facility-administered medications on file prior to visit.      Review of Systems neg for any significant sore throat, dysphagia, itching, sneezing, nasal congestion or excess/ purulent secretions, fever, chills, sweats, unintended wt loss, pleuritic or exertional cp, hempoptysis, orthopnea pnd or change in chronic leg swelling. Also denies presyncope, palpitations, heartburn, abdominal pain, nausea, vomiting, diarrhea or change in bowel or urinary habits, dysuria,hematuria, rash, arthralgias, visual complaints, headache, numbness weakness or ataxia.     Objective:   Physical Exam  Gen. Pleasant, well-nourished, in no distress ENT - no lesions, no post nasal drip Neck: No JVD, no thyromegaly, no carotid bruits Lungs: no use of accessory muscles, no dullness to percussion, clear without rales or rhonchi    Cardiovascular: Rhythm regular, heart sounds  normal, no murmurs or gallops, no peripheral edema Musculoskeletal: No deformities, no cyanosis or clubbing    Rachel Braman NP-C  [facility]  09/17/15

## 2015-09-18 NOTE — Assessment & Plan Note (Addendum)
Controlled on rx  Recent flare now resolved Plan  Continue on BREO 1 puff daily , rinse well after use.  Follow up with Dr. Elsworth Soho  4 months and As needed

## 2015-09-21 DIAGNOSIS — R21 Rash and other nonspecific skin eruption: Secondary | ICD-10-CM | POA: Diagnosis not present

## 2015-09-21 DIAGNOSIS — R5383 Other fatigue: Secondary | ICD-10-CM | POA: Diagnosis not present

## 2015-09-21 DIAGNOSIS — Z638 Other specified problems related to primary support group: Secondary | ICD-10-CM | POA: Diagnosis not present

## 2015-09-21 DIAGNOSIS — N3941 Urge incontinence: Secondary | ICD-10-CM | POA: Diagnosis not present

## 2015-09-21 DIAGNOSIS — Z79899 Other long term (current) drug therapy: Secondary | ICD-10-CM | POA: Diagnosis not present

## 2015-09-21 DIAGNOSIS — F331 Major depressive disorder, recurrent, moderate: Secondary | ICD-10-CM | POA: Diagnosis not present

## 2015-09-22 ENCOUNTER — Telehealth: Payer: Self-pay | Admitting: Adult Health

## 2015-09-22 NOTE — Telephone Encounter (Signed)
Spoke with pt, aware of recs.  pred taper has not been sent in at pt request.  Nothing further needed.

## 2015-09-22 NOTE — Telephone Encounter (Signed)
Per TP Can hold prednisone for a few days to see if symptoms improve Make sure to stay out of car until checked out completely.

## 2015-09-22 NOTE — Telephone Encounter (Signed)
Per 09/17/15 OV w/ TP; Instructions  Continue on BREO 1 puff daily , rinse well after use.  Follow up with Dr. Elsworth Soho  4 months and As needed   ----  Called spoke with pt. Pt reports her A/C in her car may have mold. She reports normally she wears a mask when she is out yesterday she forgot to put a mask on. She reports she woke up this morning wheezing, throat congestion, has a cough but can't seem to get mucus out. Denies any chest tightness.  Pt is requesting prednisone.  Please advise TP thanks

## 2015-09-22 NOTE — Telephone Encounter (Signed)
Lets try Prednisone taper 10mg  #20 , 4 tabs for 2 days, then 3 tabs for 2 days, 2 tabs for 2 days, then 1 tab for 2 days, then stop , no refills . If not improving or worsens will need ov or ER Please contact office for sooner follow up if symptoms do not improve or worsen or seek emergency care   Would take car in for check .

## 2015-09-22 NOTE — Telephone Encounter (Signed)
Pt aware of rec's per TP - states that she just completed a Pred Taper (8-day) on 7/24 given by Dr Halford Chessman on 08/31/15. Pt is concerned about taking another long taper so close to the one she just completed. Pt wants to know if maybe she should wait a few days to see if her symptoms improve before starting Prednisone again if Tammy Parrett wants her to take it. Pt states that they are not going to drive that car until it gets checked out. Please advise Tammy Parrett. Thanks.

## 2015-09-24 DIAGNOSIS — L308 Other specified dermatitis: Secondary | ICD-10-CM | POA: Diagnosis not present

## 2015-10-01 ENCOUNTER — Encounter: Payer: PRIVATE HEALTH INSURANCE | Admitting: Gastroenterology

## 2015-10-26 DIAGNOSIS — I129 Hypertensive chronic kidney disease with stage 1 through stage 4 chronic kidney disease, or unspecified chronic kidney disease: Secondary | ICD-10-CM | POA: Diagnosis not present

## 2015-10-26 DIAGNOSIS — N183 Chronic kidney disease, stage 3 (moderate): Secondary | ICD-10-CM | POA: Diagnosis not present

## 2015-10-26 DIAGNOSIS — Z23 Encounter for immunization: Secondary | ICD-10-CM | POA: Diagnosis not present

## 2015-10-26 DIAGNOSIS — M797 Fibromyalgia: Secondary | ICD-10-CM | POA: Diagnosis not present

## 2015-10-26 DIAGNOSIS — F325 Major depressive disorder, single episode, in full remission: Secondary | ICD-10-CM | POA: Diagnosis not present

## 2015-10-26 DIAGNOSIS — Z79899 Other long term (current) drug therapy: Secondary | ICD-10-CM | POA: Diagnosis not present

## 2015-10-28 ENCOUNTER — Other Ambulatory Visit: Payer: Self-pay | Admitting: Gastroenterology

## 2015-11-17 ENCOUNTER — Ambulatory Visit (AMBULATORY_SURGERY_CENTER): Payer: Self-pay

## 2015-11-17 VITALS — Ht 64.0 in | Wt 173.2 lb

## 2015-11-17 DIAGNOSIS — Z86010 Personal history of colonic polyps: Secondary | ICD-10-CM

## 2015-11-17 DIAGNOSIS — R1319 Other dysphagia: Secondary | ICD-10-CM

## 2015-11-17 DIAGNOSIS — R1310 Dysphagia, unspecified: Secondary | ICD-10-CM

## 2015-11-17 MED ORDER — MOVIPREP 100 G PO SOLR: ORAL | 0 refills | Status: DC

## 2015-11-17 NOTE — Progress Notes (Signed)
Per pt, no allergies to soy or egg products.Pt not taking any weight loss meds or using  O2 at home. 

## 2015-11-23 ENCOUNTER — Telehealth: Payer: Self-pay | Admitting: Gastroenterology

## 2015-11-23 NOTE — Telephone Encounter (Signed)
Patient calling in again regarding this. She is now wanting to know if she should just cancel procedure and reschedule to another time?

## 2015-11-23 NOTE — Telephone Encounter (Signed)
Many questions. She is trying to decide what morning medications to take. She is accustom to taking these medications after her breakfast. It is agreed she will take her anti-hypertensive medication. She will hold the 81 mg ASA until after the procedure.  Also agreed she will call us if she has any difficulty with her prep such as nausea or vomiting.

## 2015-11-24 ENCOUNTER — Encounter: Payer: Self-pay | Admitting: Gastroenterology

## 2015-11-24 ENCOUNTER — Telehealth: Payer: Self-pay | Admitting: Gastroenterology

## 2015-11-24 NOTE — Telephone Encounter (Signed)
Verified her dosage of Miralax as per her instruction sheet.

## 2015-11-26 ENCOUNTER — Telehealth: Payer: Self-pay | Admitting: *Deleted

## 2015-11-26 ENCOUNTER — Ambulatory Visit (AMBULATORY_SURGERY_CENTER): Payer: Medicare Other | Admitting: Gastroenterology

## 2015-11-26 ENCOUNTER — Encounter: Payer: Self-pay | Admitting: Gastroenterology

## 2015-11-26 VITALS — BP 108/64 | HR 67 | Temp 97.3°F | Resp 12 | Ht 64.0 in | Wt 172.0 lb

## 2015-11-26 DIAGNOSIS — D122 Benign neoplasm of ascending colon: Secondary | ICD-10-CM

## 2015-11-26 DIAGNOSIS — D124 Benign neoplasm of descending colon: Secondary | ICD-10-CM

## 2015-11-26 DIAGNOSIS — R1310 Dysphagia, unspecified: Secondary | ICD-10-CM | POA: Diagnosis not present

## 2015-11-26 DIAGNOSIS — F419 Anxiety disorder, unspecified: Secondary | ICD-10-CM | POA: Diagnosis not present

## 2015-11-26 DIAGNOSIS — R0600 Dyspnea, unspecified: Secondary | ICD-10-CM | POA: Diagnosis not present

## 2015-11-26 DIAGNOSIS — Z86010 Personal history of colonic polyps: Secondary | ICD-10-CM | POA: Diagnosis not present

## 2015-11-26 DIAGNOSIS — K219 Gastro-esophageal reflux disease without esophagitis: Secondary | ICD-10-CM | POA: Diagnosis not present

## 2015-11-26 MED ORDER — SODIUM CHLORIDE 0.9 % IV SOLN: 500.0000 mL | INTRAVENOUS | Status: DC

## 2015-11-26 NOTE — Telephone Encounter (Signed)
Pt said she is "feeling full and a little sick after drinking the Moviprep."  She is asking for suggestions to keep prep down.  I gave her several suggestions on how to make the prep a little easier and all questions answered.

## 2015-11-26 NOTE — Patient Instructions (Signed)
Impression/Recommendations:  Polyp handout given. Diverticulosis handout given. Hemorrhoid handout given.  Repeat colonoscopy in 5 years (2022).YOU HAD AN ENDOSCOPIC PROCEDURE TODAY AT [facility]:   Refer to the procedure report that was given to you for any specific questions about what was found during the examination.  If the procedure report does not answer your questions, please call your gastroenterologist to clarify.  If you requested that your care partner not be given the details of your procedure findings, then the procedure report has been included in a sealed envelope for you to review at your convenience later.  YOU SHOULD EXPECT: Some feelings of bloating in the abdomen. Passage of more gas than usual.  Walking can help get rid of the air that was put into your GI tract during the procedure and reduce the bloating. If you had a lower endoscopy (such as a colonoscopy or flexible sigmoidoscopy) you may notice spotting of blood in your stool or on the toilet paper. If you underwent a bowel prep for your procedure, you may not have a normal bowel movement for a few days.  Please Note:  You might notice some irritation and congestion in your nose or some drainage.  This is from the oxygen used during your procedure.  There is no need for concern and it should clear up in a day or so.  SYMPTOMS TO REPORT IMMEDIATELY:   Following lower endoscopy (colonoscopy or flexible sigmoidoscopy):  Excessive amounts of blood in the stool  Significant tenderness or worsening of abdominal pains  Swelling of the abdomen that is new, acute  Fever of 100F or higher   Following upper endoscopy (EGD)  Vomiting of blood or coffee ground material  New chest pain or pain under the shoulder blades  Painful or persistently difficult swallowing  New shortness of breath  Fever of 100F or higher  Black, tarry-looking stools  For urgent or emergent issues, a gastroenterologist can be  reached at any hour by calling [phone].   DIET:  We do recommend a small meal at first, but then you may proceed to your regular diet.  Drink plenty of fluids but you should avoid alcoholic beverages for 24 hours.  ACTIVITY:  You should plan to take it easy for the rest of today and you should NOT DRIVE or use heavy machinery until tomorrow (because of the sedation medicines used during the test).    FOLLOW UP: Our staff will call the number listed on your records the next business day following your procedure to check on you and address any questions or concerns that you may have regarding the information given to you following your procedure. If we do not reach you, we will leave a message.  However, if you are feeling well and you are not experiencing any problems, there is no need to return our call.  We will assume that you have returned to your regular daily activities without incident.  If any biopsies were taken you will be contacted by phone or by letter within the next 1-3 weeks.  Please call us at [phone] if you have not heard about the biopsies in 3 weeks.    SIGNATURES/CONFIDENTIALITY: You and/or your care partner have signed paperwork which will be entered into your electronic medical record.  These signatures attest to the fact that that the information above on your After Visit Summary has been reviewed and is understood.  Full responsibility of the confidentiality of this discharge information lies  with you and/or your care-partner.

## 2015-11-26 NOTE — Op Note (Signed)
[facility] Patient Name: [name]" Cermak Procedure Date: 11/26/2015 3:03 PM MRN: [name] Endoscopist: Mauri Pole , MD Age: 75 Referring MD:  Date of Birth: May 24, 1940 Gender: Female Account #: [id number] Procedure:                Colonoscopy Indications:              Surveillance: Personal history of adenomatous                            polyps on last colonoscopy > 5 years ago, , Last                            colonoscopy: 2012 (2006) Medicines:                Monitored Anesthesia Care Procedure:                Pre-Anesthesia Assessment:                           - Prior to the procedure, a History and Physical                            was performed, and patient medications and                            allergies were reviewed. The patient's tolerance of                            previous anesthesia was also reviewed. The risks                            and benefits of the procedure and the sedation                            options and risks were discussed with the patient.                            All questions were answered, and informed consent                            was obtained. Prior Anticoagulants: The patient has                            taken no previous anticoagulant or antiplatelet                            agents. ASA Grade Assessment: III - A patient with                            severe systemic disease. After reviewing the risks                            and benefits, the patient was deemed in  satisfactory condition to undergo the procedure.                           - Prior to the procedure, a History and Physical                            was performed, and patient medications and                            allergies were reviewed. The patient's tolerance of                            previous anesthesia was also reviewed. The risks                            and benefits of the  procedure and the sedation                            options and risks were discussed with the patient.                            All questions were answered, and informed consent                            was obtained. Prior Anticoagulants: The patient has                            taken no previous anticoagulant or antiplatelet                            agents. ASA Grade Assessment: II - A patient with                            mild systemic disease. After reviewing the risks                            and benefits, the patient was deemed in                            satisfactory condition to undergo the procedure.                           After obtaining informed consent, the colonoscope                            was passed under direct vision. Throughout the                            procedure, the patient's blood pressure, pulse, and                            oxygen saturations were monitored continuously. The  Model PCF-H190L [id number]) scope was introduced                            through the anus and advanced to the the cecum,                            identified by appendiceal orifice and ileocecal                            valve. The colonoscopy was performed without                            difficulty. The patient tolerated the procedure                            well. The quality of the bowel preparation was                            good. The ileocecal valve, appendiceal orifice, and                            rectum were photographed. Scope In: 3:04:31 PM Scope Out: 3:21:35 PM Scope Withdrawal Time: 0 hours 11 minutes 37 seconds  Total Procedure Duration: 0 hours 17 minutes 4 seconds  Findings:                 The perianal and digital rectal examinations were                            normal.                           A 2 mm polyp was found in the ascending colon. The                            polyp was sessile. The polyp was  removed with a                            cold biopsy forceps. Resection and retrieval were                            complete.                           A 5 mm polyp was found in the descending colon. The                            polyp was sessile. The polyp was removed with a                            cold snare. Resection and retrieval were complete.                           Non-bleeding internal hemorrhoids were found during  retroflexion. The hemorrhoids were small.                           A few small-mouthed diverticula were found in the                            sigmoid colon. Complications:            No immediate complications. Estimated Blood Loss:     Estimated blood loss: none. Impression:               - One 2 mm polyp in the ascending colon, removed                            with a cold biopsy forceps. Resected and retrieved.                           - One 5 mm polyp in the descending colon, removed                            with a cold snare. Resected and retrieved.                           - Non-bleeding internal hemorrhoids.                           - Diverticulosis in the sigmoid colon. Recommendation:           - Patient has a contact number available for                            emergencies. The signs and symptoms of potential                            delayed complications were discussed with the                            patient. Return to normal activities tomorrow.                            Written discharge instructions were provided to the                            patient.                           - Resume previous diet.                           - Continue present medications.                           - Await pathology results.                           - Repeat colonoscopy in 5 years for surveillance.                           -  Return to GI clinic PRN. Mauri Pole, MD 11/26/2015 3:36:31 PM This report  has been signed electronically.

## 2015-11-26 NOTE — Progress Notes (Signed)
Called to room to assist during endoscopic procedure.  Patient ID and intended procedure confirmed with present staff. Received instructions for my participation in the procedure from the performing physician.  

## 2015-11-26 NOTE — Op Note (Signed)
[facility] Patient Name: [name]" Endo Procedure Date: 11/26/2015 2:47 PM MRN: [name] Endoscopist: Mauri Pole , MD Age: 75 Referring MD:  Date of Birth: Aug 31, 1940 Gender: Female Account #: [id number] Procedure:                Upper GI endoscopy Indications:              Dysphagia Medicines:                Monitored Anesthesia Care Procedure:                Pre-Anesthesia Assessment:                           - Prior to the procedure, a History and Physical                            was performed, and patient medications and                            allergies were reviewed. The patient's tolerance of                            previous anesthesia was also reviewed. The risks                            and benefits of the procedure and the sedation                            options and risks were discussed with the patient.                            All questions were answered, and informed consent                            was obtained. Prior Anticoagulants: The patient has                            taken no previous anticoagulant or antiplatelet                            agents. ASA Grade Assessment: III - A patient with                            severe systemic disease. After reviewing the risks                            and benefits, the patient was deemed in                            satisfactory condition to undergo the procedure.                           After obtaining informed consent, the endoscope was  passed under direct vision. Throughout the                            procedure, the patient's blood pressure, pulse, and                            oxygen saturations were monitored continuously. The                            Model GIF-[id number]) scope was introduced                            through the mouth, and advanced to the second part                            of duodenum. The upper  GI endoscopy was                            accomplished without difficulty. The patient                            tolerated the procedure well. Scope In: Scope Out: Findings:                 The esophagus was normal.                           The stomach was normal.                           The examined duodenum was normal. Complications:            No immediate complications. Estimated Blood Loss:     Estimated blood loss: none. Impression:               - Normal esophagus.                           - Normal stomach.                           - Normal examined duodenum.                           - No specimens collected. Recommendation:           - Patient has a contact number available for                            emergencies. The signs and symptoms of potential                            delayed complications were discussed with the                            patient. Return to normal activities tomorrow.  Written discharge instructions were provided to the                            patient.                           - Resume previous diet.                           - Continue present medications.                           - No repeat upper endoscopy. Mauri Pole, MD 11/26/2015 3:32:21 PM This report has been signed electronically.

## 2015-11-26 NOTE — Progress Notes (Signed)
To recovery awake alert VSS report to [employer]

## 2015-11-27 ENCOUNTER — Telehealth: Payer: Self-pay | Admitting: Gastroenterology

## 2015-11-27 ENCOUNTER — Telehealth: Payer: Self-pay | Admitting: *Deleted

## 2015-11-27 NOTE — Telephone Encounter (Signed)
Discussed appropriate use of Miralax to prevent constipation. She will check with her PCP if there is any limit from a kidney function standpoint.She is not constipated. Having small stools today.

## 2015-11-27 NOTE — Telephone Encounter (Signed)
  Follow up Call-  Call back number 11/26/2015  Post procedure Call Back phone  # [phone]  Permission to leave phone message Yes  Some recent data might be hidden     Patient questions:  Do you have a fever, pain , or abdominal swelling? No. Pain Score  0 *  Have you tolerated food without any problems? Yes.    Have you been able to return to your normal activities? Yes.    Do you have any questions about your discharge instructions: Diet   No. Medications  No. Follow up visit  No.  Do you have questions or concerns about your Care? Yes.     Pt states that she has a small knot under her lower jaw.  She states that it feels sore. She has no other symptoms.  I advised her to keep an eye on it over the weekend and to call us back if it worsens or if she begins to have other symptoms.   Actions: * If pain score is 4 or above: No action needed, pain <4.

## 2015-12-02 ENCOUNTER — Telehealth: Payer: Self-pay | Admitting: Gastroenterology

## 2015-12-03 NOTE — Telephone Encounter (Signed)
Spoke with the patient. She report eating fish for supper 2 days ago. Her stomach was upset, hurting her into the night. She took Peto-bismol and she took dicyclomine but doesn't feel like it helped very much. She did not have nausea, fever or diarrhea. He husband did not have any problems and he had the same meal. She reports no change in her bowel movements which "have never been right." Today she is a little better. She has the VSL#3 that was given to her and she has never tried it. Discussed this further.  Patient decided she will take the VSL#3 daily for the 3 weeks as was previously intended. Call back if there is an acute worsening or a failure to improve.

## 2015-12-07 ENCOUNTER — Encounter: Payer: Self-pay | Admitting: Gastroenterology

## 2015-12-09 NOTE — Telephone Encounter (Signed)
error 

## 2016-01-11 ENCOUNTER — Encounter: Payer: Self-pay | Admitting: Adult Health

## 2016-01-11 ENCOUNTER — Ambulatory Visit (INDEPENDENT_AMBULATORY_CARE_PROVIDER_SITE_OTHER): Payer: Medicare Other | Admitting: Adult Health

## 2016-01-11 DIAGNOSIS — J4530 Mild persistent asthma, uncomplicated: Secondary | ICD-10-CM

## 2016-01-11 NOTE — Patient Instructions (Signed)
Continue on BREO 1 puff daily , rinse well after use.  Follow up with Dr. Elsworth Soho  6 months and As needed

## 2016-01-11 NOTE — Progress Notes (Signed)
Subjective:    Patient ID: [name] "[name], female    DOB: [birth year], 75 y.o.   MRN: [name]  HPI 75 years old never smoker for FU of asthma She has multiple medical problems including movement disorder, hypertension dyslipidemia , asthma, depression and fibromyalgia Initial pulmonary consult 05/24/12    06/26/12 PFT today showed Nml FEV1 at 114% , min drop in ratio at 63, Mild obstruction in mid flows at 58% w/ 20% BD response.      01/11/2016 Follow up:  Asthma  Patient returns for 3 month  follow up .  She remains on BREO daily . Says overall her breathing is doing well.  Rare SABA use.  Denies cough or wheezing.  Flu is  utd.  Denies chest pain, orthopnea, edema or fever.   Past Medical History:  Diagnosis Date  . ADD (attention deficit disorder)   . Anxiety   . Arthritis   . Asthma   . Chronic kidney disease    CKD Stage 3  . Chronic renal insufficiency, stage III (moderate)   . Depression    major depression  . Dermatitis   . Diverticulosis   . Essential tremor 05/17/2012  . Fibromyalgia   . GERD (gastroesophageal reflux disease)   . Hyperlipidemia   . Hypertension   . Memory loss 05/17/2012  . Migraines   . Occasional tremors   . Pneumonia   . TIA (transient ischemic attack)    History of 2 TIA's per pt   Current Outpatient Prescriptions on File Prior to Visit  Medication Sig Dispense Refill  . acetaZOLAMIDE (DIAMOX) 125 MG tablet TAKE ONE TABLET BY MOUTH TWICE DAILY 180 tablet 3  . aspirin 81 MG tablet Take 81 mg by mouth daily.    . Coenzyme Q10 (CO Q 10 PO) Take 1 tablet by mouth daily.    . DULoxetine (CYMBALTA) 30 MG capsule Take 30 mg by mouth daily.    . ergocalciferol (VITAMIN D2) 50000 UNITS capsule Take 50,000 Units by mouth once a week.    . fluticasone (FLONASE) 50 MCG/ACT nasal spray Place 2 sprays into both nostrils daily. 16 g 2  . fluticasone furoate-vilanterol (BREO ELLIPTA) 100-25 MCG/INH AEPB Inhale 1 puff into the lungs  daily. 1 each 5  . HYDROcodone-acetaminophen (NORCO) 7.5-325 MG per tablet Take 1 tablet by mouth 2 (two) times daily. For pain    . hydrocortisone 2.5 % cream daily as needed.    . hyoscyamine (LEVSIN SL) 0.125 MG SL tablet DISSOLVE ONE TABLET UNDER THE TONGUE EVERY 6 HOURS AS NEEDED 30 tablet 0  . levothyroxine (SYNTHROID, LEVOTHROID) 50 MCG tablet Once daily    . LORazepam (ATIVAN) 1 MG tablet Take 1 tablet by mouth at bedtime.    Marland Kitchen losartan (COZAAR) 100 MG tablet Take 100 mg by mouth daily.    . Multiple Vitamins-Minerals (ICAPS PO) Take 1 tablet by mouth daily.    Marland Kitchen omeprazole (PRILOSEC) 20 MG capsule Take 1 capsule by mouth 2 (two) times daily before a meal.    . polyethylene glycol (MIRALAX / GLYCOLAX) packet Take 17 g by mouth daily.    . pregabalin (LYRICA) 50 MG capsule Take 50 mg by mouth daily.     Marland Kitchen PROAIR HFA 108 (90 Base) MCG/ACT inhaler INHALE TWO PUFFS BY MOUTH EVERY 4 HOURS AS NEEDED FOR WHEEZING OR SHORTNESS OF BREATH 9 each 0  . SUMAtriptan (IMITREX) 50 MG tablet Take 50 mg by mouth every 2 (two) hours  as needed. For migraine     No current facility-administered medications on file prior to visit.      Review of Systems neg for any significant sore throat, dysphagia, itching, sneezing, nasal congestion or excess/ purulent secretions, fever, chills, sweats, unintended wt loss, pleuritic or exertional cp, hempoptysis, orthopnea pnd or change in chronic leg swelling. Also denies presyncope, palpitations, heartburn, abdominal pain, nausea, vomiting, diarrhea or change in bowel or urinary habits, dysuria,hematuria, rash, arthralgias, visual complaints, headache, numbness weakness or ataxia.     Objective:   Physical Exam  Vitals:   01/11/16 1412  BP: 118/78  Pulse: 76  Temp: 98.2 F (36.8 C)  TempSrc: Oral  SpO2: 97%  Weight: 171 lb 12.8 oz (77.9 kg)  Height: 5\' 4"  (1.626 m)    Gen. Pleasant, well-nourished, in no distress ENT - no lesions, no post nasal  drip Neck: No JVD, no thyromegaly, no carotid bruits Lungs: no use of accessory muscles, no dullness to percussion, clear without rales or rhonchi  Cardiovascular: Rhythm regular, heart sounds  normal, no murmurs or gallops, no peripheral edema Musculoskeletal: No deformities, no cyanosis or clubbing    Akira Perusse NP-C  [facility]  09/17/15

## 2016-01-11 NOTE — Assessment & Plan Note (Signed)
Doing very well on BREO .  If on return still doing well , can consider change to ICS only   Plan  Patient Instructions  Continue on BREO 1 puff daily , rinse well after use.  Follow up with Dr. Elsworth Soho  6 months and As needed

## 2016-01-14 NOTE — Progress Notes (Signed)
Reviewed & agree with plan  

## 2016-01-21 ENCOUNTER — Telehealth: Payer: Self-pay | Admitting: Neurology

## 2016-01-21 NOTE — Telephone Encounter (Signed)
Patient has worsening headaches, pressure in head and vision is worse. Needs sooner appointment before scheduled appointment with [facility] on 04-20-16.

## 2016-01-21 NOTE — Telephone Encounter (Signed)
Returned pt's call and sooner appt scheduled for next wk.

## 2016-01-26 ENCOUNTER — Other Ambulatory Visit: Payer: Self-pay | Admitting: Gastroenterology

## 2016-01-28 ENCOUNTER — Ambulatory Visit (INDEPENDENT_AMBULATORY_CARE_PROVIDER_SITE_OTHER): Payer: Medicare Other | Admitting: Adult Health

## 2016-01-28 ENCOUNTER — Encounter: Payer: Self-pay | Admitting: Adult Health

## 2016-01-28 VITALS — BP 122/60 | HR 76 | Resp 16 | Ht 64.0 in | Wt 171.0 lb

## 2016-01-28 DIAGNOSIS — R51 Headache: Secondary | ICD-10-CM

## 2016-01-28 DIAGNOSIS — R251 Tremor, unspecified: Secondary | ICD-10-CM

## 2016-01-28 DIAGNOSIS — R519 Headache, unspecified: Secondary | ICD-10-CM

## 2016-01-28 NOTE — Progress Notes (Signed)
PATIENT: [name] DOB: 1940/10/06  REASON FOR VISIT: follow up- tremor, headaches HISTORY FROM: patient  HISTORY OF PRESENT ILLNESS: [name] is a 75 year old female with a history of tremor and headaches. She returns today for follow-up. She was recently started on Diamox. She states that she is only taking one tablet a day. She feels that her tremor is stable. She states that she usually gets the headache anytime she gets upset and cries. She states they usually occurs daily. She does have a psychiatrist who has her on Cymbalta and has recommended that she follow up with a psychologist. Patient reports that she did have an appointment but she canceled. She states that she is not sure of the benefit. She does note that her headaches are directly related to her depression and anxiety. She reports that her depression and anxiety related to her family situation. She returns today for an evaluation.  HISTORY 04/21/15: [name] is a 75 year old right-handed white female with a history of a tremor, treated with methazolamide. The patient also has migraine headaches, she recently had a headache that lasted about 3 days. She has significant depression, she is considering ketamine treatments or even ECT treatments. She indicates that her right foot may feel cold at night. She continues to have some mild issues with memory that have not significantly progressed since last seen. She mainly has some issues with word finding problems and remembering names. The patient operates a motor vehicle without difficulty. She comes back to the office today for an evaluation.  REVIEW OF SYSTEMS: Out of a complete 14 system review of symptoms, the patient complains only of the following symptoms, and all other reviewed systems are negative.  See history of present illness  ALLERGIES: Allergies  Allergen Reactions  . Crestor [Rosuvastatin Calcium]     myalgia  . Zocor [Simvastatin]     myalgia   . Enalapril     swelling  . Lamictal [Lamotrigine]     rash  . Nitrofuran Derivatives Nausea Only  . Erythromycin Other (See Comments)    Stomach burning  . Vivelle [Estradiol]     rash  . Ciprofloxacin Nausea Only  . Ciprofloxacin Hcl     rash  . Latex Rash  . Shellfish Allergy Rash  . Sulfa Antibiotics Nausea Only    HOME MEDICATIONS: Outpatient Medications Prior to Visit  Medication Sig Dispense Refill  . acetaZOLAMIDE (DIAMOX) 125 MG tablet TAKE ONE TABLET BY MOUTH TWICE DAILY 180 tablet 3  . aspirin 81 MG tablet Take 81 mg by mouth daily.    . Coenzyme Q10 (CO Q 10 PO) Take 1 tablet by mouth daily.    . DULoxetine (CYMBALTA) 30 MG capsule Take 30 mg by mouth daily.    . ergocalciferol (VITAMIN D2) 50000 UNITS capsule Take 50,000 Units by mouth once a week.    . fluticasone (FLONASE) 50 MCG/ACT nasal spray Place 2 sprays into both nostrils daily. 16 g 2  . fluticasone furoate-vilanterol (BREO ELLIPTA) 100-25 MCG/INH AEPB Inhale 1 puff into the lungs daily. 1 each 5  . HYDROcodone-acetaminophen (NORCO) 7.5-325 MG per tablet Take 1 tablet by mouth 2 (two) times daily. For pain    . hydrocortisone 2.5 % cream daily as needed.    . hyoscyamine (LEVSIN SL) 0.125 MG SL tablet DISSOLVE ONE TABLET UNDER THE TONGUE EVERY 6 HOURS AS NEEDED 30 tablet 0  . levothyroxine (SYNTHROID, LEVOTHROID) 50 MCG tablet Once daily    .  LORazepam (ATIVAN) 1 MG tablet Take 1 tablet by mouth at bedtime.    Marland Kitchen losartan (COZAAR) 100 MG tablet Take 100 mg by mouth daily.    . Multiple Vitamins-Minerals (ICAPS PO) Take 1 tablet by mouth daily.    Marland Kitchen omeprazole (PRILOSEC) 20 MG capsule Take 1 capsule by mouth 2 (two) times daily before a meal.    . polyethylene glycol (MIRALAX / GLYCOLAX) packet Take 17 g by mouth daily.    . pregabalin (LYRICA) 50 MG capsule Take 50 mg by mouth daily.     Marland Kitchen PROAIR HFA 108 (90 Base) MCG/ACT inhaler INHALE TWO PUFFS BY MOUTH EVERY 4 HOURS AS NEEDED FOR WHEEZING OR SHORTNESS  OF BREATH 9 each 0  . SUMAtriptan (IMITREX) 50 MG tablet Take 50 mg by mouth every 2 (two) hours as needed. For migraine     No facility-administered medications prior to visit.     PAST MEDICAL HISTORY: Past Medical History:  Diagnosis Date  . ADD (attention deficit disorder)   . Anxiety   . Arthritis   . Asthma   . Chronic kidney disease    CKD Stage 3  . Chronic renal insufficiency, stage III (moderate)   . Depression    major depression  . Dermatitis   . Diverticulosis   . Essential tremor 05/17/2012  . Fibromyalgia   . GERD (gastroesophageal reflux disease)   . Hyperlipidemia   . Hypertension   . Memory loss 05/17/2012  . Migraines   . Occasional tremors   . Pneumonia   . TIA (transient ischemic attack)    History of 2 TIA's per pt    PAST SURGICAL HISTORY: Past Surgical History:  Procedure Laterality Date  . ABDOMINAL HYSTERECTOMY    . APPENDECTOMY    . CATARACT EXTRACTION Bilateral   . TONSILLECTOMY AND ADENOIDECTOMY     age 20  . TOTAL HIP ARTHROPLASTY Bilateral 2009   Dr Adriana Mccallum    FAMILY HISTORY: Family History  Problem Relation Age of Onset  . Liver cancer Mother   . Pulmonary embolism Mother   . Prostate cancer Father   . Stroke Maternal Grandmother   . Heart failure Maternal Grandfather   . Emphysema Maternal Grandfather   . Congestive Heart Failure Maternal Grandfather   . Coronary artery disease Paternal Grandmother   . Coronary artery disease Paternal Grandfather   . Prostate cancer Paternal Uncle     x 2  . ALS Paternal Aunt   . Brain cancer Paternal Aunt   . Breast cancer Paternal Aunt   . Lung cancer Maternal Aunt   . Depression Brother     suicide  . Diabetes Paternal Aunt     x 2  . Colon cancer Neg Hx   . Esophageal cancer Neg Hx   . Rectal cancer Neg Hx   . Stomach cancer Neg Hx     SOCIAL HISTORY: Social History   Social History  . Marital status: Married    Spouse name: [name]  . Number of children: 2  . Years of  education: hs   Occupational History  . Retired   .  Retired   Social History Main Topics  . Smoking status: Never Smoker  . Smokeless tobacco: Never Used  . Alcohol use No  . Drug use: No  . Sexual activity: No   Other Topics Concern  . Not on file   Social History Narrative   Patient lives at home with her husband [name]).  Retired.   Education high school.   Right handed.   Caffeine two cups of coffee daily and one glass of sweet tea.      PHYSICAL EXAM  Vitals:   01/28/16 1344  BP: 122/60  Pulse: 76  Resp: 16  Weight: 171 lb (77.6 kg)  Height: 5\' 4"  (1.626 m)   Body mass index is 29.35 kg/m.  Generalized: Well developed, in no acute distress   Neurological examination  Mentation: Alert oriented to time, place, history taking. Follows all commands speech and language fluent Cranial nerve II-XII: Pupils were equal round reactive to light. Extraocular movements were full, visual field were full on confrontational test. Facial sensation and strength were normal. Uvula tongue midline. Head turning and shoulder shrug  were normal and symmetric. Motor: The motor testing reveals 5 over 5 strength of all 4 extremities. Good symmetric motor tone is noted throughout.  Sensory: Sensory testing is intact to soft touch on all 4 extremities. No evidence of extinction is noted.  Coordination: Cerebellar testing reveals good finger-nose-finger and heel-to-shin bilaterally.  Gait and station: Gait is normal.  Reflexes: Deep tendon reflexes are symmetric and normal bilaterally.   DIAGNOSTIC DATA (LABS, IMAGING, TESTING) - I reviewed patient records, labs, notes, testing and imaging myself where available.  Lab Results  Component Value Date   WBC 7.2 03/03/2012   HGB 14.1 03/03/2012   HCT 40.2 03/03/2012   MCV 92.8 03/03/2012   PLT 255 03/03/2012      Component Value Date/Time   NA 138 12/29/2014 1519   K 4.5 12/29/2014 1519   CL 100 12/29/2014 1519   CO2 26  12/29/2014 1519   GLUCOSE 78 12/29/2014 1519   GLUCOSE 87 05/29/2012 1405   BUN 17 12/29/2014 1519   CREATININE 0.98 12/29/2014 1519   CALCIUM 10.1 12/29/2014 1519   PROT 6.9 12/29/2014 1519   ALBUMIN 4.2 12/29/2014 1519   AST 18 12/29/2014 1519   ALT 12 12/29/2014 1519   ALKPHOS 80 12/29/2014 1519   BILITOT 0.5 12/29/2014 1519   GFRNONAA 58 (L) 12/29/2014 1519   GFRAA 67 12/29/2014 1519   Lab Results  Component Value Date   CHOL 254 (H) 03/04/2012   HDL 30 (L) 03/04/2012   LDLCALC 158 (H) 03/04/2012   TRIG 328 (H) 03/04/2012   CHOLHDL 8.5 03/04/2012    ASSESSMENT AND PLAN 75 y.o. year old female  has a past medical history of ADD (attention deficit disorder); Anxiety; Arthritis; Asthma; Chronic kidney disease; Chronic renal insufficiency, stage III (moderate); Depression; Dermatitis; Diverticulosis; Essential tremor (05/17/2012); Fibromyalgia; GERD (gastroesophageal reflux disease); Hyperlipidemia; Hypertension; Memory loss (05/17/2012); Migraines; Occasional tremors; Pneumonia; and TIA (transient ischemic attack). here with:  1. Tremor 2. Headache  I have encouraged the patient to increase Diamox to 1 tablet twice a day. This may be beneficial for her tremor as well as her headaches. I have also encouraged the patient to make an appointment with the psychologist. Her depression and anxiety seem to be directly related to her headaches. Advised patient that if her symptoms worsen or she develops any new symptoms she should let us know. She will follow-up in 6 months or sooner if needed.   Ward Givens, MSN, NP-C 01/28/2016, 1:44 PM [facility] [address], [place], [place] [address] [phone]

## 2016-01-28 NOTE — Patient Instructions (Signed)
Increase Diamox to 125 mg twice a day If your symptoms worsen or you develop new symptoms please let us know.

## 2016-01-28 NOTE — Progress Notes (Signed)
I have read the note, and I agree with the clinical assessment and plan.  Alycia Cooperwood KEITH   

## 2016-02-02 ENCOUNTER — Ambulatory Visit: Payer: Medicare Other | Admitting: Pulmonary Disease

## 2016-02-02 DIAGNOSIS — H02831 Dermatochalasis of right upper eyelid: Secondary | ICD-10-CM | POA: Diagnosis not present

## 2016-02-02 DIAGNOSIS — H524 Presbyopia: Secondary | ICD-10-CM | POA: Diagnosis not present

## 2016-02-02 DIAGNOSIS — H02834 Dermatochalasis of left upper eyelid: Secondary | ICD-10-CM | POA: Diagnosis not present

## 2016-02-02 DIAGNOSIS — H52203 Unspecified astigmatism, bilateral: Secondary | ICD-10-CM | POA: Diagnosis not present

## 2016-02-02 DIAGNOSIS — H5213 Myopia, bilateral: Secondary | ICD-10-CM | POA: Diagnosis not present

## 2016-02-02 DIAGNOSIS — Z961 Presence of intraocular lens: Secondary | ICD-10-CM | POA: Diagnosis not present

## 2016-02-04 DIAGNOSIS — L309 Dermatitis, unspecified: Secondary | ICD-10-CM | POA: Diagnosis not present

## 2016-02-04 DIAGNOSIS — N3000 Acute cystitis without hematuria: Secondary | ICD-10-CM | POA: Diagnosis not present

## 2016-02-04 DIAGNOSIS — R300 Dysuria: Secondary | ICD-10-CM | POA: Diagnosis not present

## 2016-03-08 ENCOUNTER — Other Ambulatory Visit: Payer: Self-pay

## 2016-03-08 MED ORDER — FLUTICASONE FUROATE-VILANTEROL 100-25 MCG/INH IN AEPB: 1.0000 | INHALATION_SPRAY | Freq: Every day | RESPIRATORY_TRACT | 2 refills | Status: DC

## 2016-03-30 DIAGNOSIS — R21 Rash and other nonspecific skin eruption: Secondary | ICD-10-CM | POA: Diagnosis not present

## 2016-03-30 DIAGNOSIS — M791 Myalgia: Secondary | ICD-10-CM | POA: Diagnosis not present

## 2016-03-30 DIAGNOSIS — N183 Chronic kidney disease, stage 3 (moderate): Secondary | ICD-10-CM | POA: Diagnosis not present

## 2016-03-30 DIAGNOSIS — I129 Hypertensive chronic kidney disease with stage 1 through stage 4 chronic kidney disease, or unspecified chronic kidney disease: Secondary | ICD-10-CM | POA: Diagnosis not present

## 2016-03-30 DIAGNOSIS — F329 Major depressive disorder, single episode, unspecified: Secondary | ICD-10-CM | POA: Diagnosis not present

## 2016-04-02 DIAGNOSIS — R350 Frequency of micturition: Secondary | ICD-10-CM | POA: Diagnosis not present

## 2016-04-02 DIAGNOSIS — R1030 Lower abdominal pain, unspecified: Secondary | ICD-10-CM | POA: Diagnosis not present

## 2016-04-06 DIAGNOSIS — N390 Urinary tract infection, site not specified: Secondary | ICD-10-CM | POA: Diagnosis not present

## 2016-04-20 ENCOUNTER — Ambulatory Visit: Payer: Medicare Other | Admitting: Adult Health

## 2016-05-03 DIAGNOSIS — Z01419 Encounter for gynecological examination (general) (routine) without abnormal findings: Secondary | ICD-10-CM | POA: Diagnosis not present

## 2016-05-03 DIAGNOSIS — N644 Mastodynia: Secondary | ICD-10-CM | POA: Diagnosis not present

## 2016-05-13 ENCOUNTER — Other Ambulatory Visit: Payer: Self-pay | Admitting: Geriatric Medicine

## 2016-05-13 DIAGNOSIS — Z1231 Encounter for screening mammogram for malignant neoplasm of breast: Secondary | ICD-10-CM

## 2016-05-25 ENCOUNTER — Telehealth: Payer: Self-pay | Admitting: Gastroenterology

## 2016-05-26 ENCOUNTER — Telehealth: Payer: Self-pay | Admitting: Gastroenterology

## 2016-05-26 ENCOUNTER — Other Ambulatory Visit: Payer: Self-pay

## 2016-05-26 MED ORDER — HYOSCYAMINE SULFATE 0.125 MG SL SUBL: SUBLINGUAL_TABLET | SUBLINGUAL | 0 refills | Status: DC

## 2016-05-26 NOTE — Telephone Encounter (Signed)
She is taking Miralax once a day. She complains of her stomach rumbling and gripe a lot. Some days she has multiple bowel movements soft formed. At times there is urgency. No diarrhea. She wakes up nauseated, but she is able to eat. She remains a little nauseous until she has her first bowel movement. She has not had any vomiting, fevers, rashes or diarrhea. Should she change from Miralax to a different stool softener? She is sure she cannot have a bowel movement without "something".

## 2016-05-26 NOTE — Telephone Encounter (Signed)
Patient is in agreement with this plan. She will call back as needed.

## 2016-05-26 NOTE — Telephone Encounter (Signed)
Please advise patient to hold Miralax. If she is starting to get constipated she can take Colace as needed or call us and we can send Rx for Linzess 73mcg daily. Thanks

## 2016-05-26 NOTE — Telephone Encounter (Signed)
Called pharmacy to change qty on levsin to #60

## 2016-05-30 DIAGNOSIS — N183 Chronic kidney disease, stage 3 (moderate): Secondary | ICD-10-CM | POA: Diagnosis not present

## 2016-05-30 DIAGNOSIS — M797 Fibromyalgia: Secondary | ICD-10-CM | POA: Diagnosis not present

## 2016-05-30 DIAGNOSIS — I129 Hypertensive chronic kidney disease with stage 1 through stage 4 chronic kidney disease, or unspecified chronic kidney disease: Secondary | ICD-10-CM | POA: Diagnosis not present

## 2016-05-30 DIAGNOSIS — G894 Chronic pain syndrome: Secondary | ICD-10-CM | POA: Diagnosis not present

## 2016-05-31 ENCOUNTER — Other Ambulatory Visit: Payer: Self-pay | Admitting: Pulmonary Disease

## 2016-06-08 DIAGNOSIS — R300 Dysuria: Secondary | ICD-10-CM | POA: Diagnosis not present

## 2016-06-08 DIAGNOSIS — R399 Unspecified symptoms and signs involving the genitourinary system: Secondary | ICD-10-CM | POA: Diagnosis not present

## 2016-06-27 ENCOUNTER — Ambulatory Visit: Payer: Medicare Other

## 2016-06-28 ENCOUNTER — Ambulatory Visit: Payer: Medicare Other | Admitting: Pulmonary Disease

## 2016-06-28 ENCOUNTER — Ambulatory Visit
Admission: RE | Admit: 2016-06-28 | Discharge: 2016-06-28 | Disposition: A | Discharge status: Home / Self Care | Payer: Medicare Other | Source: Ambulatory Visit | Attending: Geriatric Medicine | Admitting: Geriatric Medicine

## 2016-06-28 DIAGNOSIS — Z1231 Encounter for screening mammogram for malignant neoplasm of breast: Secondary | ICD-10-CM

## 2016-07-14 ENCOUNTER — Ambulatory Visit: Payer: Medicare Other | Admitting: Adult Health

## 2016-07-14 NOTE — Progress Notes (Deleted)
PATIENT: [name] "[name] DOB: [birth year]  REASON FOR VISIT: follow up- tremors, headaches HISTORY FROM: patient  HISTORY OF PRESENT ILLNESS: Ms. [name] is a 76 year old female with a history of tremors and headaches. She returns today for follow-up. At the last visit she was having instructed to increase Diamox to 1 tablet twice a day. She is also advised to follow-up with a psychologist regarding anxiety and depression. She reports that   HISTORY Ms. [name] is a 75 year old female with a history of tremor and headaches. She returns today for follow-up. She was recently started on Diamox. She states that she is only taking one tablet a day. She feels that her tremor is stable. She states that she usually gets the headache anytime she gets upset and cries. She states they usually occurs daily. She does have a psychiatrist who has her on Cymbalta and has recommended that she follow up with a psychologist. Patient reports that she did have an appointment but she canceled. She states that she is not sure of the benefit. She does note that her headaches are directly related to her depression and anxiety. She reports that her depression and anxiety related to her family situation. She returns today for an evaluation.  HISTORY 04/21/15: Ms. [name] is a 76 year old right-handed white female with a history of a tremor, treated with methazolamide. The patient also has migraine headaches, she recently had a headache that lasted about 3 days. She has significant depression, she is considering ketamine treatments or even ECT treatments. She indicates that her right foot may feel cold at night. She continues to have some mild issues with memory that have not significantly progressed since last seen. She mainly has some issues with word finding problems and remembering names. The patient operates a motor vehicle without difficulty. She comes back to the office today for an evaluation.   REVIEW OF  SYSTEMS: Out of a complete 14 system review of symptoms, the patient complains only of the following symptoms, and all other reviewed systems are negative.  ALLERGIES: Allergies  Allergen Reactions  . Crestor [Rosuvastatin Calcium]     myalgia  . Zocor [Simvastatin]     myalgia  . Enalapril     swelling  . Lamictal [Lamotrigine]     rash  . Nitrofuran Derivatives Nausea Only  . Erythromycin Other (See Comments)    Stomach burning  . Vivelle [Estradiol]     rash  . Ciprofloxacin Nausea Only  . Ciprofloxacin Hcl     rash  . Latex Rash  . Shellfish Allergy Rash  . Sulfa Antibiotics Nausea Only    HOME MEDICATIONS: Outpatient Medications Prior to Visit  Medication Sig Dispense Refill  . acetaZOLAMIDE (DIAMOX) 125 MG tablet TAKE ONE TABLET BY MOUTH TWICE DAILY 180 tablet 3  . aspirin 81 MG tablet Take 81 mg by mouth daily.    Marland Kitchen BREO ELLIPTA 100-25 MCG/INH AEPB INHALE 1 PUFF INTO THE LUNGS DAILY. 60 each 2  . Coenzyme Q10 (CO Q 10 PO) Take 1 tablet by mouth daily.    . DULoxetine (CYMBALTA) 30 MG capsule Take 30 mg by mouth daily.    . ergocalciferol (VITAMIN D2) 50000 UNITS capsule Take 50,000 Units by mouth once a week.    . fluticasone (FLONASE) 50 MCG/ACT nasal spray Place 2 sprays into both nostrils daily. 16 g 2  . HYDROcodone-acetaminophen (NORCO) 7.5-325 MG per tablet Take 1 tablet by mouth 2 (two) times daily. For pain    .  hydrocortisone 2.5 % cream daily as needed.    . hyoscyamine (LEVSIN SL) 0.125 MG SL tablet DISSOLVE ONE TABLET UNDER THE TONGUE EVERY 6 HOURS AS NEEDED 30 tablet 0  . levothyroxine (SYNTHROID, LEVOTHROID) 50 MCG tablet Once daily    . LORazepam (ATIVAN) 1 MG tablet Take 1 tablet by mouth at bedtime.    Marland Kitchen losartan (COZAAR) 100 MG tablet Take 100 mg by mouth daily.    . Multiple Vitamins-Minerals (ICAPS PO) Take 1 tablet by mouth daily.    Marland Kitchen omeprazole (PRILOSEC) 20 MG capsule Take 1 capsule by mouth 2 (two) times daily before a meal.    .  polyethylene glycol (MIRALAX / GLYCOLAX) packet Take 17 g by mouth daily.    . pregabalin (LYRICA) 50 MG capsule Take 50 mg by mouth daily.     Marland Kitchen PROAIR HFA 108 (90 Base) MCG/ACT inhaler INHALE TWO PUFFS BY MOUTH EVERY 4 HOURS AS NEEDED FOR WHEEZING OR SHORTNESS OF BREATH 9 each 0  . SUMAtriptan (IMITREX) 50 MG tablet Take 50 mg by mouth every 2 (two) hours as needed. For migraine     No facility-administered medications prior to visit.     PAST MEDICAL HISTORY: Past Medical History:  Diagnosis Date  . ADD (attention deficit disorder)   . Anxiety   . Arthritis   . Asthma   . Chronic kidney disease    CKD Stage 3  . Chronic renal insufficiency, stage III (moderate)   . Depression    major depression  . Dermatitis   . Diverticulosis   . Essential tremor 05/17/2012  . Fibromyalgia   . GERD (gastroesophageal reflux disease)   . Hyperlipidemia   . Hypertension   . Memory loss 05/17/2012  . Migraines   . Occasional tremors   . Pneumonia   . TIA (transient ischemic attack)    History of 2 TIA's per pt    PAST SURGICAL HISTORY: Past Surgical History:  Procedure Laterality Date  . ABDOMINAL HYSTERECTOMY    . APPENDECTOMY    . CATARACT EXTRACTION Bilateral   . TONSILLECTOMY AND ADENOIDECTOMY     age 44  . TOTAL HIP ARTHROPLASTY Bilateral 2009   Dr Adriana Mccallum    FAMILY HISTORY: Family History  Problem Relation Age of Onset  . Liver cancer Mother   . Pulmonary embolism Mother   . Prostate cancer Father   . Stroke Maternal Grandmother   . Heart failure Maternal Grandfather   . Emphysema Maternal Grandfather   . Congestive Heart Failure Maternal Grandfather   . Coronary artery disease Paternal Grandmother   . Coronary artery disease Paternal Grandfather   . Prostate cancer Paternal Uncle        x 2  . ALS Paternal Aunt   . Brain cancer Paternal Aunt   . Breast cancer Paternal Aunt   . Lung cancer Maternal Aunt   . Depression Brother        suicide  . Diabetes  Paternal Aunt        x 2  . Colon cancer Neg Hx   . Esophageal cancer Neg Hx   . Rectal cancer Neg Hx   . Stomach cancer Neg Hx     SOCIAL HISTORY: Social History   Social History  . Marital status: Married    Spouse name: [name]  . Number of children: 2  . Years of education: hs   Occupational History  . Retired   .  Retired   Social History  Main Topics  . Smoking status: Never Smoker  . Smokeless tobacco: Never Used  . Alcohol use No  . Drug use: No  . Sexual activity: No   Other Topics Concern  . Not on file   Social History Narrative   Patient lives at home with her husband [name]).    Retired.   Education high school.   Right handed.   Caffeine two cups of coffee daily and one glass of sweet tea.      PHYSICAL EXAM  There were no vitals filed for this visit. There is no height or weight on file to calculate BMI.  Generalized: Well developed, in no acute distress   Neurological examination  Mentation: Alert oriented to time, place, history taking. Follows all commands speech and language fluent Cranial nerve II-XII: Pupils were equal round reactive to light. Extraocular movements were full, visual field were full on confrontational test. Facial sensation and strength were normal. Uvula tongue midline. Head turning and shoulder shrug  were normal and symmetric. Motor: The motor testing reveals 5 over 5 strength of all 4 extremities. Good symmetric motor tone is noted throughout.  Sensory: Sensory testing is intact to soft touch on all 4 extremities. No evidence of extinction is noted.  Coordination: Cerebellar testing reveals good finger-nose-finger and heel-to-shin bilaterally.  Gait and station: Gait is normal. Tandem gait is normal. Romberg is negative. No drift is seen.  Reflexes: Deep tendon reflexes are symmetric and normal bilaterally.   DIAGNOSTIC DATA (LABS, IMAGING, TESTING) - I reviewed patient records, labs, notes, testing and imaging myself  where available.  Lab Results  Component Value Date   WBC 7.2 03/03/2012   HGB 14.1 03/03/2012   HCT 40.2 03/03/2012   MCV 92.8 03/03/2012   PLT 255 03/03/2012      Component Value Date/Time   NA 138 12/29/2014 1519   K 4.5 12/29/2014 1519   CL 100 12/29/2014 1519   CO2 26 12/29/2014 1519   GLUCOSE 78 12/29/2014 1519   GLUCOSE 87 05/29/2012 1405   BUN 17 12/29/2014 1519   CREATININE 0.98 12/29/2014 1519   CALCIUM 10.1 12/29/2014 1519   PROT 6.9 12/29/2014 1519   ALBUMIN 4.2 12/29/2014 1519   AST 18 12/29/2014 1519   ALT 12 12/29/2014 1519   ALKPHOS 80 12/29/2014 1519   BILITOT 0.5 12/29/2014 1519   GFRNONAA 58 (L) 12/29/2014 1519   GFRAA 67 12/29/2014 1519   Lab Results  Component Value Date   CHOL 254 (H) 03/04/2012   HDL 30 (L) 03/04/2012   LDLCALC 158 (H) 03/04/2012   TRIG 328 (H) 03/04/2012   CHOLHDL 8.5 03/04/2012   No results found for: HGBA1C No results found for: VITAMINB12 No results found for: TSH    ASSESSMENT AND PLAN 76 y.o. year old female  has a past medical history of ADD (attention deficit disorder); Anxiety; Arthritis; Asthma; Chronic kidney disease; Chronic renal insufficiency, stage III (moderate); Depression; Dermatitis; Diverticulosis; Essential tremor (05/17/2012); Fibromyalgia; GERD (gastroesophageal reflux disease); Hyperlipidemia; Hypertension; Memory loss (05/17/2012); Migraines; Occasional tremors; Pneumonia; and TIA (transient ischemic attack). here with:  1. Tremors 2. Headaches     Ward Givens, MSN, NP-C 07/14/2016, 11:52 AM [facility] [address], [place], [place] [address] [phone]

## 2016-07-15 ENCOUNTER — Encounter: Payer: Self-pay | Admitting: Adult Health

## 2016-07-19 DIAGNOSIS — F322 Major depressive disorder, single episode, severe without psychotic features: Secondary | ICD-10-CM | POA: Diagnosis not present

## 2016-07-19 DIAGNOSIS — F325 Major depressive disorder, single episode, in full remission: Secondary | ICD-10-CM | POA: Diagnosis not present

## 2016-07-19 DIAGNOSIS — I129 Hypertensive chronic kidney disease with stage 1 through stage 4 chronic kidney disease, or unspecified chronic kidney disease: Secondary | ICD-10-CM | POA: Diagnosis not present

## 2016-07-19 DIAGNOSIS — N183 Chronic kidney disease, stage 3 (moderate): Secondary | ICD-10-CM | POA: Diagnosis not present

## 2016-07-20 ENCOUNTER — Ambulatory Visit (INDEPENDENT_AMBULATORY_CARE_PROVIDER_SITE_OTHER): Payer: Medicare Other | Admitting: Neurology

## 2016-07-20 ENCOUNTER — Encounter: Payer: Self-pay | Admitting: Neurology

## 2016-07-20 VITALS — BP 129/83 | HR 92 | Ht 64.0 in | Wt 169.5 lb

## 2016-07-20 DIAGNOSIS — G43819 Other migraine, intractable, without status migrainosus: Secondary | ICD-10-CM | POA: Diagnosis not present

## 2016-07-20 NOTE — Progress Notes (Signed)
Reason for visit: Tremor  Molley "[name]" [name] is an 76 y.o. female  History of present illness:  [name] is a 76 year old right-handed white female with a history of an essential tremor. The tremor is relatively well controlled on low-dose Diamox taking one 125 mg tablet daily. The patient was not able tolerate 2 tablets daily. The patient continues to complain of ongoing severe depression with daily thoughts of suicide. She claims that her home environment is intolerable. Having said that, she has not followed directions of her psychiatrist to seek out a counselor or psychologist. She sleeps 10 or 11 hours at night and then sleeps off and on throughout the day, and complains that she is too fatigued to do anything such as walk or exercise. She does not want to go outside the house even though she claims that the home environment is too stressful for her. The patient has ongoing daily tension-type headache with pressure sensations in the head. She returns this office for an evaluation.  Past Medical History:  Diagnosis Date  . ADD (attention deficit disorder)   . Anxiety   . Arthritis   . Asthma   . Chronic kidney disease    CKD Stage 3  . Chronic renal insufficiency, stage III (moderate)   . Depression    major depression  . Dermatitis   . Diverticulosis   . Essential tremor 05/17/2012  . Fibromyalgia   . GERD (gastroesophageal reflux disease)   . Hyperlipidemia   . Hypertension   . Memory loss 05/17/2012  . Migraines   . Occasional tremors   . Pneumonia   . TIA (transient ischemic attack)    History of 2 TIA's per pt    Past Surgical History:  Procedure Laterality Date  . ABDOMINAL HYSTERECTOMY    . APPENDECTOMY    . CATARACT EXTRACTION Bilateral   . TONSILLECTOMY AND ADENOIDECTOMY     age 67  . TOTAL HIP ARTHROPLASTY Bilateral 2009   Dr Adriana Mccallum    Family History  Problem Relation Age of Onset  . Liver cancer Mother   . Pulmonary embolism Mother   .  Prostate cancer Father   . Stroke Maternal Grandmother   . Heart failure Maternal Grandfather   . Emphysema Maternal Grandfather   . Congestive Heart Failure Maternal Grandfather   . Coronary artery disease Paternal Grandmother   . Coronary artery disease Paternal Grandfather   . Prostate cancer Paternal Uncle        x 2  . ALS Paternal Aunt   . Brain cancer Paternal Aunt   . Breast cancer Paternal Aunt   . Lung cancer Maternal Aunt   . Depression Brother        suicide  . Diabetes Paternal Aunt        x 2  . Colon cancer Neg Hx   . Esophageal cancer Neg Hx   . Rectal cancer Neg Hx   . Stomach cancer Neg Hx     Social history:  reports that she has never smoked. She has never used smokeless tobacco. She reports that she does not drink alcohol or use drugs.    Allergies  Allergen Reactions  . Crestor [Rosuvastatin Calcium]     myalgia  . Zocor [Simvastatin]     myalgia  . Enalapril     swelling  . Lamictal [Lamotrigine]     rash  . Nitrofuran Derivatives Nausea Only  . Erythromycin Other (See Comments)  Stomach burning  . Vivelle [Estradiol]     rash  . Ciprofloxacin Nausea Only  . Ciprofloxacin Hcl     rash  . Latex Rash  . Shellfish Allergy Rash  . Sulfa Antibiotics Nausea Only    Medications:  Prior to Admission medications   Medication Sig Start Date End Date Taking? Authorizing Provider  acetaZOLAMIDE (DIAMOX) 125 MG tablet TAKE ONE TABLET BY MOUTH TWICE DAILY 08/24/15  Yes Kathrynn Ducking, MD  aspirin 81 MG tablet Take 81 mg by mouth daily.   Yes [provider]  BREO ELLIPTA 100-25 MCG/INH AEPB INHALE 1 PUFF INTO THE LUNGS DAILY. 05/31/16  Yes Rigoberto Noel, MD  Coenzyme Q10 (CO Q 10 PO) Take 1 tablet by mouth daily.   Yes [provider]  DULoxetine (CYMBALTA) 30 MG capsule Take 30 mg by mouth daily.   Yes [provider]  ergocalciferol (VITAMIN D2) 50000 UNITS capsule Take 50,000 Units by mouth once a week.   Yes  [provider]  fluticasone (FLONASE) 50 MCG/ACT nasal spray Place 2 sprays into both nostrils daily. Patient taking differently: Place 2 sprays into both nostrils as needed.  08/31/15  Yes Chesley Mires, MD  HYDROcodone-acetaminophen (NORCO) 7.5-325 MG per tablet Take 1 tablet by mouth 2 (two) times daily. For pain   Yes [provider]  hydrocortisone 2.5 % cream daily as needed. 09/19/14  Yes [provider]  hyoscyamine (LEVSIN SL) 0.125 MG SL tablet DISSOLVE ONE TABLET UNDER THE TONGUE EVERY 6 HOURS AS NEEDED 05/26/16  Yes Nandigam, Venia Minks, MD  levothyroxine (SYNTHROID, LEVOTHROID) 50 MCG tablet Once daily 08/07/13  Yes [provider]  LORazepam (ATIVAN) 1 MG tablet Take 1 tablet by mouth at bedtime. 05/17/14  Yes [provider]  losartan (COZAAR) 100 MG tablet Take 100 mg by mouth daily.   Yes [provider]  Multiple Vitamins-Minerals (ICAPS PO) Take 1 tablet by mouth daily.   Yes [provider]  omeprazole (PRILOSEC) 20 MG capsule Take 1 capsule by mouth 2 (two) times daily before a meal. 04/01/14  Yes [provider]  pregabalin (LYRICA) 50 MG capsule Take 50 mg by mouth daily.    Yes [provider]  PROAIR HFA 108 (90 Base) MCG/ACT inhaler INHALE TWO PUFFS BY MOUTH EVERY 4 HOURS AS NEEDED FOR WHEEZING OR SHORTNESS OF BREATH 08/20/15  Yes Rigoberto Noel, MD  SUMAtriptan (IMITREX) 50 MG tablet Take 50 mg by mouth every 2 (two) hours as needed. For migraine   Yes [provider]    ROS:  Out of a complete 14 system review of symptoms, the patient complains only of the following symptoms, and all other reviewed systems are negative.  Ear pain, ringing in the ears Eye redness, light sensitivity, eye pain Chest pain Abdominal pain, constipation, nausea Daytime sleepiness Difficulty urinating, incontinence of the bladder, urinary urgency Joint pain Skin rash Memory loss, headache, weakness, tremors,  droopy eyelids  Blood pressure 129/83, pulse 92, height 5\' 4"  (1.626 m), weight 169 lb 8 oz (76.9 kg).  Physical Exam  General: The patient is alert and cooperative at the time of the examination.  Skin: No significant peripheral edema is noted.   Neurologic Exam  Mental status: The patient is alert and oriented x 3 at the time of the examination. The patient has apparent normal recent and remote memory, with an apparently normal attention span and concentration ability.   Cranial nerves: Facial symmetry is present.  Speech is normal, no aphasia or dysarthria is noted. Extraocular movements are full. Visual fields are full.  Motor: The patient has good strength in all 4 extremities.  Sensory examination: Soft touch sensation is symmetric on the face, arms, and legs.  Coordination: The patient has good finger-nose-finger and heel-to-shin bilaterally.  Gait and station: The patient has a normal gait. Tandem gait is slightly unsteady. Romberg is negative. No drift is seen.  Reflexes: Deep tendon reflexes are symmetric.   MRI brain 01/16/15:  IMPRESSION:  This MRI of the brain with and without contrast shows the following: 1. Mild cortical atrophy that is probably within normal limits for age. 2.  Scattered T2/FLAIR hyperintense foci in the subcortical deep white matter of both hemispheres consistent with chronic microvascular ischemic changes. The extent is mildly more than expected for age. When compared to the MRI dated 11/17/2008, some of these have developed during the interim, though none appears to be acute on the current study. 3.  There are no acute findings.  * MRI scan images were reviewed online. I agree with the written report.    Assessment/Plan:  1. Chronic depression  2. Chronic daily headache, muscle tension type  3. Tremor  The patient actually has minimal tremor. She will continue the low-dose Diamox, she will follow-up in one year. I urged her to follow  the advice of her psychiatrist and seek a psychological counselor. The patient needs to increase physical activity to reduce fatigue. She will follow-up in one year.   Jill Alexanders MD 07/20/2016 2:42 PM  [facility] [address] [place], [place] [address]  Phone [phone] Fax [phone]

## 2016-07-26 ENCOUNTER — Telehealth: Payer: Self-pay | Admitting: Gastroenterology

## 2016-07-26 MED ORDER — LINACLOTIDE 72 MCG PO CAPS: 72.0000 ug | ORAL_CAPSULE | Freq: Every day | ORAL | 2 refills | Status: DC

## 2016-07-26 NOTE — Telephone Encounter (Signed)
The patient reports she had a small bowel movement this morning. She states her last "normal" bowel movement was about 5 days ago. She strained hard this morning. Now her side hurts. Denies nausea, fever or blood with her bowel movement. After a lot of questioning, the patient states she has not been taking any stool softeners. She took 2 Ex-lax pills last night. She is having waves of cramps. She has also taken Levsin which has helped with her cramping.  Patient wants to do a Miralax purge, then try linzess 72 mcg. Please advise.

## 2016-07-26 NOTE — Telephone Encounter (Signed)
Agree with bowel purge with Miralax and gatorade. She can start low dose Linzess 82mcg daily after bowel purge, if no improvement, will need to increase the dose of Linzess to 161mcg

## 2016-07-28 ENCOUNTER — Ambulatory Visit: Payer: Medicare Other | Admitting: Adult Health

## 2016-08-03 DIAGNOSIS — R070 Pain in throat: Secondary | ICD-10-CM | POA: Diagnosis not present

## 2016-08-03 DIAGNOSIS — I129 Hypertensive chronic kidney disease with stage 1 through stage 4 chronic kidney disease, or unspecified chronic kidney disease: Secondary | ICD-10-CM | POA: Diagnosis not present

## 2016-08-03 DIAGNOSIS — N183 Chronic kidney disease, stage 3 (moderate): Secondary | ICD-10-CM | POA: Diagnosis not present

## 2016-08-03 DIAGNOSIS — M2550 Pain in unspecified joint: Secondary | ICD-10-CM | POA: Diagnosis not present

## 2016-08-03 DIAGNOSIS — F322 Major depressive disorder, single episode, severe without psychotic features: Secondary | ICD-10-CM | POA: Diagnosis not present

## 2016-08-15 ENCOUNTER — Other Ambulatory Visit: Payer: Self-pay

## 2016-08-15 ENCOUNTER — Telehealth: Payer: Self-pay | Admitting: Gastroenterology

## 2016-08-15 MED ORDER — LINACLOTIDE 145 MCG PO CAPS: 145.0000 ug | ORAL_CAPSULE | Freq: Every day | ORAL | 3 refills | Status: DC

## 2016-08-15 NOTE — Telephone Encounter (Signed)
Discussed with the patient increasing to Linzess 145 mcg daily. She would like a prescription called to [facility]. She is not taking any stool softener. They seem to "gripe " her stomach. She says her stomach will not feel good all day. Presently she is having about 3 bowel movements each week.

## 2016-08-19 NOTE — Telephone Encounter (Signed)
Patient states she's calling about her condition and would like to speak with nurse [facility]

## 2016-08-22 NOTE — Telephone Encounter (Signed)
She had a little diarrhea, but her husband did too. She thinks they may have eaten something that did not agrees with their stomachs. She will continue to adjust her bowel medications as her needs change. Call with questions or concerns. And appointment if she has symptoms that she feels she cannot correct.

## 2016-08-24 ENCOUNTER — Other Ambulatory Visit: Payer: Self-pay | Admitting: Pulmonary Disease

## 2016-09-06 ENCOUNTER — Telehealth: Payer: Self-pay | Admitting: Gastroenterology

## 2016-09-06 MED ORDER — LINACLOTIDE 290 MCG PO CAPS: 290.0000 ug | ORAL_CAPSULE | Freq: Every day | ORAL | 3 refills | Status: DC

## 2016-09-06 NOTE — Telephone Encounter (Signed)
Med sent to [facility] [place]

## 2016-09-12 DIAGNOSIS — I129 Hypertensive chronic kidney disease with stage 1 through stage 4 chronic kidney disease, or unspecified chronic kidney disease: Secondary | ICD-10-CM | POA: Diagnosis not present

## 2016-09-12 DIAGNOSIS — F325 Major depressive disorder, single episode, in full remission: Secondary | ICD-10-CM | POA: Diagnosis not present

## 2016-09-12 DIAGNOSIS — F322 Major depressive disorder, single episode, severe without psychotic features: Secondary | ICD-10-CM | POA: Diagnosis not present

## 2016-09-12 DIAGNOSIS — J4530 Mild persistent asthma, uncomplicated: Secondary | ICD-10-CM | POA: Diagnosis not present

## 2016-09-12 DIAGNOSIS — E039 Hypothyroidism, unspecified: Secondary | ICD-10-CM | POA: Diagnosis not present

## 2016-09-12 DIAGNOSIS — N183 Chronic kidney disease, stage 3 (moderate): Secondary | ICD-10-CM | POA: Diagnosis not present

## 2016-09-13 ENCOUNTER — Ambulatory Visit: Payer: Medicare Other | Admitting: Pulmonary Disease

## 2016-09-22 ENCOUNTER — Other Ambulatory Visit: Payer: Self-pay | Admitting: Pulmonary Disease

## 2016-09-23 ENCOUNTER — Other Ambulatory Visit: Payer: Self-pay | Admitting: Gastroenterology

## 2016-10-13 DIAGNOSIS — R109 Unspecified abdominal pain: Secondary | ICD-10-CM | POA: Diagnosis not present

## 2016-10-13 DIAGNOSIS — N390 Urinary tract infection, site not specified: Secondary | ICD-10-CM | POA: Diagnosis not present

## 2016-11-04 DIAGNOSIS — G8929 Other chronic pain: Secondary | ICD-10-CM | POA: Diagnosis not present

## 2016-11-04 DIAGNOSIS — Z23 Encounter for immunization: Secondary | ICD-10-CM | POA: Diagnosis not present

## 2016-11-04 DIAGNOSIS — N183 Chronic kidney disease, stage 3 (moderate): Secondary | ICD-10-CM | POA: Diagnosis not present

## 2016-11-04 DIAGNOSIS — M542 Cervicalgia: Secondary | ICD-10-CM | POA: Diagnosis not present

## 2016-11-04 DIAGNOSIS — R0789 Other chest pain: Secondary | ICD-10-CM | POA: Diagnosis not present

## 2016-11-04 DIAGNOSIS — M545 Low back pain: Secondary | ICD-10-CM | POA: Diagnosis not present

## 2016-11-04 DIAGNOSIS — F322 Major depressive disorder, single episode, severe without psychotic features: Secondary | ICD-10-CM | POA: Diagnosis not present

## 2016-11-04 DIAGNOSIS — I129 Hypertensive chronic kidney disease with stage 1 through stage 4 chronic kidney disease, or unspecified chronic kidney disease: Secondary | ICD-10-CM | POA: Diagnosis not present

## 2016-11-11 DIAGNOSIS — N390 Urinary tract infection, site not specified: Secondary | ICD-10-CM | POA: Diagnosis not present

## 2016-11-15 DIAGNOSIS — N390 Urinary tract infection, site not specified: Secondary | ICD-10-CM | POA: Diagnosis not present

## 2016-12-06 ENCOUNTER — Ambulatory Visit: Payer: Medicare Other | Admitting: Pulmonary Disease

## 2016-12-08 DIAGNOSIS — I129 Hypertensive chronic kidney disease with stage 1 through stage 4 chronic kidney disease, or unspecified chronic kidney disease: Secondary | ICD-10-CM | POA: Diagnosis not present

## 2016-12-08 DIAGNOSIS — N183 Chronic kidney disease, stage 3 (moderate): Secondary | ICD-10-CM | POA: Diagnosis not present

## 2016-12-08 DIAGNOSIS — J4530 Mild persistent asthma, uncomplicated: Secondary | ICD-10-CM | POA: Diagnosis not present

## 2016-12-08 DIAGNOSIS — F322 Major depressive disorder, single episode, severe without psychotic features: Secondary | ICD-10-CM | POA: Diagnosis not present

## 2016-12-08 DIAGNOSIS — F325 Major depressive disorder, single episode, in full remission: Secondary | ICD-10-CM | POA: Diagnosis not present

## 2016-12-08 DIAGNOSIS — E039 Hypothyroidism, unspecified: Secondary | ICD-10-CM | POA: Diagnosis not present

## 2016-12-13 ENCOUNTER — Ambulatory Visit (INDEPENDENT_AMBULATORY_CARE_PROVIDER_SITE_OTHER): Payer: Medicare Other | Admitting: Pulmonary Disease

## 2016-12-13 ENCOUNTER — Other Ambulatory Visit: Payer: Self-pay | Admitting: Geriatric Medicine

## 2016-12-13 ENCOUNTER — Encounter: Payer: Self-pay | Admitting: Pulmonary Disease

## 2016-12-13 DIAGNOSIS — R1013 Epigastric pain: Secondary | ICD-10-CM | POA: Diagnosis not present

## 2016-12-13 DIAGNOSIS — Z79899 Other long term (current) drug therapy: Secondary | ICD-10-CM | POA: Diagnosis not present

## 2016-12-13 DIAGNOSIS — J4530 Mild persistent asthma, uncomplicated: Secondary | ICD-10-CM | POA: Diagnosis not present

## 2016-12-13 DIAGNOSIS — K5900 Constipation, unspecified: Secondary | ICD-10-CM | POA: Diagnosis not present

## 2016-12-13 DIAGNOSIS — I129 Hypertensive chronic kidney disease with stage 1 through stage 4 chronic kidney disease, or unspecified chronic kidney disease: Secondary | ICD-10-CM | POA: Diagnosis not present

## 2016-12-13 DIAGNOSIS — N183 Chronic kidney disease, stage 3 (moderate): Secondary | ICD-10-CM | POA: Diagnosis not present

## 2016-12-13 DIAGNOSIS — G43C0 Periodic headache syndromes in child or adult, not intractable: Secondary | ICD-10-CM | POA: Diagnosis not present

## 2016-12-13 DIAGNOSIS — E039 Hypothyroidism, unspecified: Secondary | ICD-10-CM | POA: Diagnosis not present

## 2016-12-13 MED ORDER — FLUTICASONE PROPIONATE HFA 110 MCG/ACT IN AERO: 2.0000 | INHALATION_SPRAY | Freq: Every day | RESPIRATORY_TRACT | 3 refills | Status: DC

## 2016-12-13 NOTE — Patient Instructions (Signed)
STOP taking Breo when done Start taking FLOVENT 110 mcg 2 puffs at bedtime instead Take albuterol for rescue as needed

## 2016-12-13 NOTE — Progress Notes (Signed)
   Subjective:    Patient ID: [name], female    DOB: Apr 22, 1940, 76 y.o.   MRN: [id number]  HPI  76/F  never smoker for FU of asthma She has multiple medical problems including movement disorder, hypertension dyslipidemia , asthma, depression and fibromyalgia  08/2015 - RX was stepped up to [place] from [place] Has remained stable since then , no flares, no noct symptoms  No obvious sinus congestion, PND or GERD  Significant tests/ events reviewed  CT chest in 07/2007, 05/2012 neg    06/2012 PFT showed Nml FEV1 at 114% , min drop in ratio at 63, Mild obstruction in mid flows at 58% w/ 20% BD response.    Past Medical History:  Diagnosis Date  . ADD (attention deficit disorder)   . Anxiety   . Arthritis   . Asthma   . Chronic kidney disease    CKD Stage 3  . Chronic renal insufficiency, stage III (moderate) (HCC)   . Depression    major depression  . Dermatitis   . Diverticulosis   . Essential tremor 05/17/2012  . Fibromyalgia   . GERD (gastroesophageal reflux disease)   . Hyperlipidemia   . Hypertension   . Memory loss 05/17/2012  . Migraines   . Occasional tremors   . Pneumonia   . TIA (transient ischemic attack)    History of 2 TIA's per pt      Review of Systems neg for any significant sore throat, dysphagia, itching, sneezing, nasal congestion or excess/ purulent secretions, fever, chills, sweats, unintended wt loss, pleuritic or exertional cp, hempoptysis, orthopnea pnd or change in chronic leg swelling. Also denies presyncope, palpitations, heartburn, abdominal pain, nausea, vomiting, diarrhea or change in bowel or urinary habits, dysuria,hematuria, rash, arthralgias, visual complaints, headache, numbness weakness or ataxia.     Objective:   Physical Exam   Gen. Pleasant, well-nourished, in no distress ENT - no thrush, no post nasal drip Neck: No JVD, no thyromegaly, no carotid bruits Lungs: no use of accessory muscles, no dullness to  percussion, clear without rales or rhonchi  Cardiovascular: Rhythm regular, heart sounds  normal, no murmurs or gallops, no peripheral edema Musculoskeletal: No deformities, no cyanosis or clubbing          Assessment & Plan:

## 2016-12-13 NOTE — Assessment & Plan Note (Signed)
Step down therapy- STOP taking Breo when done Start taking FLOVENT 110 mcg 2 puffs at bedtime instead Take albuterol for rescue as needed

## 2016-12-13 NOTE — Addendum Note (Signed)
Addended by: Valerie Salts on: 12/13/2016 02:51 PM   Modules accepted: Orders

## 2016-12-14 ENCOUNTER — Other Ambulatory Visit: Payer: Self-pay | Admitting: Gastroenterology

## 2016-12-16 ENCOUNTER — Ambulatory Visit
Admission: RE | Admit: 2016-12-16 | Discharge: 2016-12-16 | Disposition: A | Discharge status: Home / Self Care | Payer: Medicare Other | Source: Ambulatory Visit | Attending: Geriatric Medicine | Admitting: Geriatric Medicine

## 2016-12-16 DIAGNOSIS — R1013 Epigastric pain: Secondary | ICD-10-CM | POA: Diagnosis not present

## 2016-12-16 MED ORDER — IOHEXOL 300 MG/ML  SOLN
30.0000 mL | Freq: Once | INTRAMUSCULAR | Status: AC | PRN
  Administered 2016-12-16: 30 mL via ORAL

## 2016-12-16 MED ORDER — IOHEXOL 300 MG/ML  SOLN: 150.0000 mL | Freq: Once | INTRAMUSCULAR | Status: DC | PRN

## 2016-12-16 MED ORDER — IOPAMIDOL (ISOVUE-300) INJECTION 61%
100.0000 mL | Freq: Once | INTRAVENOUS | Status: AC | PRN
Start: 2016-12-16 — End: 2016-12-16
  Administered 2016-12-16: 100 mL via INTRAVENOUS

## 2016-12-21 ENCOUNTER — Other Ambulatory Visit: Payer: Self-pay | Admitting: Pulmonary Disease

## 2017-01-02 ENCOUNTER — Other Ambulatory Visit: Payer: Self-pay | Admitting: Gastroenterology

## 2017-01-04 DIAGNOSIS — R319 Hematuria, unspecified: Secondary | ICD-10-CM | POA: Diagnosis not present

## 2017-01-04 DIAGNOSIS — R3915 Urgency of urination: Secondary | ICD-10-CM | POA: Diagnosis not present

## 2017-01-04 DIAGNOSIS — N390 Urinary tract infection, site not specified: Secondary | ICD-10-CM | POA: Diagnosis not present

## 2017-01-18 DIAGNOSIS — R102 Pelvic and perineal pain: Secondary | ICD-10-CM | POA: Diagnosis not present

## 2017-01-18 DIAGNOSIS — G4489 Other headache syndrome: Secondary | ICD-10-CM | POA: Diagnosis not present

## 2017-01-18 DIAGNOSIS — I700 Atherosclerosis of aorta: Secondary | ICD-10-CM | POA: Diagnosis not present

## 2017-01-18 DIAGNOSIS — F322 Major depressive disorder, single episode, severe without psychotic features: Secondary | ICD-10-CM | POA: Diagnosis not present

## 2017-01-20 ENCOUNTER — Other Ambulatory Visit: Payer: Self-pay | Admitting: Gastroenterology

## 2017-01-30 ENCOUNTER — Telehealth: Payer: Self-pay | Admitting: Pulmonary Disease

## 2017-01-30 MED ORDER — ALBUTEROL SULFATE HFA 108 (90 BASE) MCG/ACT IN AERS: 2.0000 | INHALATION_SPRAY | Freq: Four times a day (QID) | RESPIRATORY_TRACT | 4 refills | Status: DC | PRN

## 2017-01-30 NOTE — Telephone Encounter (Signed)
Spoke with patient. She was requesting a refill on her rescue inhaler. She wants to switch to Ventolin to see if it will be cheaper than ProAir. Advised patient that [facility] would [facility] [place]. She verbalized understanding. Nothing else needed at time of call.

## 2017-03-16 DIAGNOSIS — R300 Dysuria: Secondary | ICD-10-CM | POA: Diagnosis not present

## 2017-03-29 ENCOUNTER — Other Ambulatory Visit: Payer: Self-pay | Admitting: Pulmonary Disease

## 2017-04-19 DIAGNOSIS — Z638 Other specified problems related to primary support group: Secondary | ICD-10-CM | POA: Diagnosis not present

## 2017-04-19 DIAGNOSIS — I129 Hypertensive chronic kidney disease with stage 1 through stage 4 chronic kidney disease, or unspecified chronic kidney disease: Secondary | ICD-10-CM | POA: Diagnosis not present

## 2017-04-19 DIAGNOSIS — E7800 Pure hypercholesterolemia, unspecified: Secondary | ICD-10-CM | POA: Diagnosis not present

## 2017-04-19 DIAGNOSIS — N183 Chronic kidney disease, stage 3 (moderate): Secondary | ICD-10-CM | POA: Diagnosis not present

## 2017-04-19 DIAGNOSIS — I700 Atherosclerosis of aorta: Secondary | ICD-10-CM | POA: Diagnosis not present

## 2017-04-30 ENCOUNTER — Other Ambulatory Visit: Payer: Self-pay | Admitting: Gastroenterology

## 2017-05-17 ENCOUNTER — Other Ambulatory Visit: Payer: Self-pay | Admitting: Obstetrics and Gynecology

## 2017-05-17 DIAGNOSIS — Z1231 Encounter for screening mammogram for malignant neoplasm of breast: Secondary | ICD-10-CM

## 2017-05-31 DIAGNOSIS — N644 Mastodynia: Secondary | ICD-10-CM | POA: Diagnosis not present

## 2017-06-01 ENCOUNTER — Other Ambulatory Visit: Payer: Self-pay | Admitting: Obstetrics and Gynecology

## 2017-06-01 DIAGNOSIS — R300 Dysuria: Secondary | ICD-10-CM | POA: Diagnosis not present

## 2017-06-01 DIAGNOSIS — R1030 Lower abdominal pain, unspecified: Secondary | ICD-10-CM | POA: Diagnosis not present

## 2017-06-01 DIAGNOSIS — R0789 Other chest pain: Secondary | ICD-10-CM | POA: Diagnosis not present

## 2017-06-01 DIAGNOSIS — R531 Weakness: Secondary | ICD-10-CM | POA: Diagnosis not present

## 2017-06-01 DIAGNOSIS — N644 Mastodynia: Secondary | ICD-10-CM

## 2017-06-04 ENCOUNTER — Other Ambulatory Visit: Payer: Self-pay | Admitting: Gastroenterology

## 2017-06-05 ENCOUNTER — Telehealth: Payer: Self-pay | Admitting: Gastroenterology

## 2017-06-05 ENCOUNTER — Other Ambulatory Visit: Payer: Self-pay

## 2017-06-05 ENCOUNTER — Telehealth: Payer: Self-pay | Admitting: Neurology

## 2017-06-05 MED ORDER — RANITIDINE HCL 150 MG PO TABS: 150.0000 mg | ORAL_TABLET | Freq: Two times a day (BID) | ORAL | 3 refills | Status: DC

## 2017-06-05 NOTE — Telephone Encounter (Signed)
Please send Rx for ranitidine 150 mg twice daily as needed.  Schedule follow-up office visit.  Please advised patient to call back if symptoms do not improve within the next 2 weeks

## 2017-06-05 NOTE — Telephone Encounter (Signed)
I called the patient, the patient is on 81 mg of aspirin, coated aspirin, still has stomach upset when she takes the medication.  She is also on omeprazole, this would have an interaction with Plavix.  She is to stop the aspirin, she may need an evaluation to look for gastritis.  She has contacted her primary care physician concerning this.

## 2017-06-05 NOTE — Telephone Encounter (Signed)
Patient agrees to this plan. Dr Jannifer Franklin has asked her to hold the ASA until her GI issue is resolved.

## 2017-06-05 NOTE — Telephone Encounter (Signed)
Pt requesting a call to discuss a "raw feeling in her stomach". Pt feels this is connection to her aspirin 81 MG tablet pt would like to discuss coming off medication for a while

## 2017-06-05 NOTE — Telephone Encounter (Signed)
Patient takes 81 mg ASA coated. She takes it in the evening after supper. She has developed stomach pain upper center quadrant. It intensifies after she takes the ASA. She is having soft unformed bowel movements. No black stools. PCP pharmacist tech told her to call us. She is not on Prilosec. She has not tried any OTC. She asks if there is anything she can take?

## 2017-06-07 ENCOUNTER — Other Ambulatory Visit: Payer: Medicare Other

## 2017-06-07 MED ORDER — CLOPIDOGREL BISULFATE 75 MG PO TABS: 75.0000 mg | ORAL_TABLET | Freq: Every day | ORAL | 3 refills | Status: DC

## 2017-06-07 NOTE — Addendum Note (Signed)
Addended by: Kathrynn Ducking on: 06/07/2017 05:31 PM   Modules accepted: Orders

## 2017-06-07 NOTE — Telephone Encounter (Signed)
I called the patient.  The patient is on Zantac which does not interact with the Plavix, the patient is off of aspirin, I will start Plavix now.  The patient has stopped prilosec.

## 2017-06-07 NOTE — Telephone Encounter (Signed)
Pt called she has been prescribed zantac 150mg  2 x day by her gastroenterologist.  She also said she is stopping the omeprazole and the aspirin which has helped her stomach. Pt said Dr Viona Gilmore mentioned plavix. Please call to advise about plavix.

## 2017-06-09 ENCOUNTER — Telehealth: Payer: Self-pay | Admitting: Gastroenterology

## 2017-06-09 NOTE — Telephone Encounter (Signed)
Spoke with the patient. She has a UTI and she is on Keflex. (U.C. Shows Klebsiella) She has an "upset stomach." Wants to know if I think the Keflex could cause this. Discussed possible side effects of antibiotic. She agrees to finish the Keflex, maintain hydration. She may take Pepto Bismol is she feel "queasy" but understands this may turn the stools black. She is having a single bowel movement each day. No diarrhea. Continue her other medications as she has been directed by her doctors.

## 2017-06-12 ENCOUNTER — Other Ambulatory Visit: Payer: Medicare Other

## 2017-06-13 DIAGNOSIS — E039 Hypothyroidism, unspecified: Secondary | ICD-10-CM | POA: Diagnosis not present

## 2017-06-13 DIAGNOSIS — I129 Hypertensive chronic kidney disease with stage 1 through stage 4 chronic kidney disease, or unspecified chronic kidney disease: Secondary | ICD-10-CM | POA: Diagnosis not present

## 2017-06-13 DIAGNOSIS — N183 Chronic kidney disease, stage 3 (moderate): Secondary | ICD-10-CM | POA: Diagnosis not present

## 2017-06-13 DIAGNOSIS — F322 Major depressive disorder, single episode, severe without psychotic features: Secondary | ICD-10-CM | POA: Diagnosis not present

## 2017-06-13 DIAGNOSIS — J4530 Mild persistent asthma, uncomplicated: Secondary | ICD-10-CM | POA: Diagnosis not present

## 2017-06-15 ENCOUNTER — Telehealth: Payer: Self-pay | Admitting: Gastroenterology

## 2017-06-15 NOTE — Telephone Encounter (Signed)
Pt wants to speak with you regarding some sxs she is having, she thinks that dosage of medication needs to be adjusted.

## 2017-06-16 ENCOUNTER — Ambulatory Visit: Payer: Medicare Other | Admitting: Adult Health

## 2017-06-16 NOTE — Telephone Encounter (Signed)
Ok to take additional dose of Ranitidine and if still has symptoms, can take Gaviscon upto three times daily as needed after meals.

## 2017-06-16 NOTE — Telephone Encounter (Signed)
Spoke with the patient. She reports she takes Ranitidine 150 mg BID. She takes her last dose about 1 to 2 hours prior to retiring. She complies with anti-reflux measures. Last night she had a "terrible spell" with acid in her throat. She feels certain her stomach or her small intestines have something wrong. She has "limitations" on what she can take for GERD because she is on Plavix. I have made her an appointment. She has not been seen in a while. For the time being, she would like to know what you suggest. She wants to know if she can double the bedtime dosage of Ranitidine?

## 2017-06-16 NOTE — Telephone Encounter (Signed)
No answer. No voicemail. 

## 2017-06-16 NOTE — Telephone Encounter (Signed)
Patient returned phone call. Best # [phone]

## 2017-06-16 NOTE — Telephone Encounter (Signed)
Patient advised. She thanks [place] for the recommendations.

## 2017-06-20 ENCOUNTER — Telehealth: Payer: Self-pay | Admitting: Gastroenterology

## 2017-06-20 MED ORDER — RANITIDINE HCL 150 MG PO TABS: 150.0000 mg | ORAL_TABLET | Freq: Two times a day (BID) | ORAL | 3 refills | Status: DC

## 2017-06-20 NOTE — Telephone Encounter (Signed)
[facility]

## 2017-06-21 DIAGNOSIS — I129 Hypertensive chronic kidney disease with stage 1 through stage 4 chronic kidney disease, or unspecified chronic kidney disease: Secondary | ICD-10-CM | POA: Diagnosis not present

## 2017-06-21 DIAGNOSIS — F322 Major depressive disorder, single episode, severe without psychotic features: Secondary | ICD-10-CM | POA: Diagnosis not present

## 2017-06-21 DIAGNOSIS — N183 Chronic kidney disease, stage 3 (moderate): Secondary | ICD-10-CM | POA: Diagnosis not present

## 2017-06-21 DIAGNOSIS — R1013 Epigastric pain: Secondary | ICD-10-CM | POA: Diagnosis not present

## 2017-06-21 DIAGNOSIS — Z79899 Other long term (current) drug therapy: Secondary | ICD-10-CM | POA: Diagnosis not present

## 2017-06-23 ENCOUNTER — Other Ambulatory Visit: Payer: Medicare Other

## 2017-06-23 ENCOUNTER — Telehealth: Payer: Self-pay | Admitting: *Deleted

## 2017-06-23 ENCOUNTER — Telehealth: Payer: Self-pay | Admitting: Gastroenterology

## 2017-06-23 ENCOUNTER — Telehealth: Payer: Self-pay | Admitting: Neurology

## 2017-06-23 NOTE — Telephone Encounter (Signed)
Pt called to inform that rf for ranitidine was sent for the incorrect dose. She takes 3 pills a day and it was sent for 2 per day. [facility] [place]

## 2017-06-23 NOTE — Telephone Encounter (Signed)
I called and talk with the patient.  She has started Plavix 3 or 4 days ago, today she feels somewhat jittery, dizzy.  This just started 1/2-hour before I talked with her today.  She indicates that her heart rate is running in the mid 50s to the 80s, her blood pressure is normal.  I have asked her to lay down for half an hour or so, if she still feels poorly, she may go to an urgent medical care for an evaluation.  The patient has not blacked out.  It is not clear that the Plavix is the source of her symptoms.  She is to stop the Plavix for about 5 days, restart the medication, if the symptoms recur, the Plavix may be the source of these symptoms.

## 2017-06-23 NOTE — Telephone Encounter (Signed)
Picked up call from phone staff. She stated she recently started on plavix. She switched from aspirin to plavix d/t possible stomach ulcer. When she woke up this am jer hands were shaking. She took her BP which was okay. HR 56.  She has ate/drank today and had regular medication. She not started any other new medication. Today is the first episode. She feels like she is going to pass out.

## 2017-06-23 NOTE — Telephone Encounter (Signed)
I called patient concerning this, please refer to phone note from this date.

## 2017-06-26 NOTE — Telephone Encounter (Signed)
Need to discuss this prescription with this patient

## 2017-06-26 NOTE — Telephone Encounter (Signed)
[facility]ac BID for the new script of 1-2 BID as needed  150mg 

## 2017-06-26 NOTE — Telephone Encounter (Signed)
Dr Silverio Decamp can you help me with this, Patient claims she takes Zantac 150 mg three times a day I only see that she has ever taken it but twice a day, did you increase to three per day ?

## 2017-06-26 NOTE — Telephone Encounter (Signed)
dont think so. We can change her Rx to Zantac 300mg  BID and give her 150mg  tabs, so she can take upto 2 tabs in AM and PM as needed. Thanks

## 2017-06-27 ENCOUNTER — Other Ambulatory Visit: Payer: Self-pay | Admitting: Pulmonary Disease

## 2017-06-27 NOTE — Telephone Encounter (Signed)
I called the patient.  The patient is on prednisone, she will be on this for another 3 days, when she stops the prednisone, she will restart the Plavix and if she has any problems on this she will call me.  Even 81 mg enteric-coated aspirin made her stomach extremely upset.

## 2017-06-27 NOTE — Telephone Encounter (Signed)
Pt has called to inform Dr Jannifer Franklin that she has completed her 5th day of not taking her Plavix.  Pt is asking for a call back as to what to do now.

## 2017-06-29 ENCOUNTER — Ambulatory Visit: Payer: Medicare Other

## 2017-06-29 ENCOUNTER — Other Ambulatory Visit: Payer: Medicare Other

## 2017-06-29 ENCOUNTER — Ambulatory Visit
Admission: RE | Admit: 2017-06-29 | Discharge: 2017-06-29 | Disposition: A | Discharge status: Home / Self Care | Payer: Medicare Other | Source: Ambulatory Visit | Attending: Obstetrics and Gynecology | Admitting: Obstetrics and Gynecology

## 2017-06-29 DIAGNOSIS — R928 Other abnormal and inconclusive findings on diagnostic imaging of breast: Secondary | ICD-10-CM | POA: Diagnosis not present

## 2017-06-29 DIAGNOSIS — N644 Mastodynia: Secondary | ICD-10-CM

## 2017-07-06 ENCOUNTER — Ambulatory Visit (INDEPENDENT_AMBULATORY_CARE_PROVIDER_SITE_OTHER): Payer: Medicare Other | Admitting: Gastroenterology

## 2017-07-06 ENCOUNTER — Other Ambulatory Visit: Payer: Medicare Other

## 2017-07-06 ENCOUNTER — Encounter: Payer: Self-pay | Admitting: Gastroenterology

## 2017-07-06 VITALS — BP 102/64 | HR 85 | Ht 64.0 in | Wt 159.0 lb

## 2017-07-06 DIAGNOSIS — Z86010 Personal history of colonic polyps: Secondary | ICD-10-CM

## 2017-07-06 DIAGNOSIS — K5909 Other constipation: Secondary | ICD-10-CM | POA: Diagnosis not present

## 2017-07-06 DIAGNOSIS — K219 Gastro-esophageal reflux disease without esophagitis: Secondary | ICD-10-CM

## 2017-07-06 DIAGNOSIS — K589 Irritable bowel syndrome without diarrhea: Secondary | ICD-10-CM

## 2017-07-06 NOTE — Patient Instructions (Signed)
Continue Zantac as needed  FDGard / IBGard three times a day as needed  Continue Linzess 290 mcg as needed    Follow up as needed   If you are age 75 or older, your body mass index should be between 23-30. Your Body mass index is 27.29 kg/m. If this is out of the aforementioned range listed, please consider follow up with your Primary Care Provider.  If you are age 63 or younger, your body mass index should be between 19-25. Your Body mass index is 27.29 kg/m. If this is out of the aformentioned range listed, please consider follow up with your Primary Care Provider.

## 2017-07-06 NOTE — Progress Notes (Signed)
[address]    [id number]    October 17, 1940  Primary Care Physician:Stoneking, Christiane Ha, MD  Referring Physician: Lajean Manes, MD [address] [place], [place] [address]  Chief complaint:  IBS HPI: 77 year old female with history of irritable bowel syndrome with predominant constipation here for follow-up visit. She was last seen in office June 2017.  Complains of intermittent abdominal pain and reflux symptoms with heartburn and regurgitation. Has had extensive GI work-up in the past as noted during prior visits. EGD and colonoscopy October 2017 EGD was normal .  During colonoscopy 2 small diminutive tubular adenomas were removed, sigmoid diverticulitis and small internal hemorrhoids She is currently taking taking Zantac 2-3 times daily with breakthrough heartburn and regurgitation.  She is on Linzess 290 mcg daily with regular bowel movements.  If she skips a dose she does not have a bowel movement. Denies any nausea, vomiting, abdominal pain, melena or bright red blood per rectum  Previous HPI: 77 year old female with h/o IBS predominant chronic constipation is here for follow-up visit. She has had extensive GI workup in the past which includes HIDA scan that was normal with ejection fraction of 80%. Gastric emptying scan was also normal with 91% emptying in 2 hours. Last upper endoscopy by Dr. Cristina Gong in July 2012 was normal. Colonoscopy in July 2012 was also normal. Prior colonoscopy in 2003 with sessile serrated polyp that was removed with biopsy and on her last colonoscopy report in 2012 was recommended surveillance colonoscopy in 5 years. She is having 2-3 soft bowel movements a day with MiraLAX daily. On review of systems complained of intermittent dysphagia with meat and bread for past few years, she feels its slow to go down and sometimes gets hung up but eventually goes down when she drinks water. No difficulty with pills or liquids. Denies any  nausea, vomiting, abdominal pain, melena or bright red blood per rectum Outpatient Encounter Medications as of 07/06/2017  Medication Sig  . acetaZOLAMIDE (DIAMOX) 125 MG tablet TAKE ONE TABLET BY MOUTH TWICE DAILY  . albuterol (PROVENTIL HFA;VENTOLIN HFA) 108 (90 Base) MCG/ACT inhaler Inhale 2 puffs into the lungs every 6 (six) hours as needed for wheezing or shortness of breath.  Marland Kitchen BREO ELLIPTA 100-25 MCG/INH AEPB TAKE 1 PUFF BY MOUTH EVERY DAY  . clopidogrel (PLAVIX) 75 MG tablet Take 1 tablet (75 mg total) by mouth daily.  . DULoxetine (CYMBALTA) 30 MG capsule Take 30 mg by mouth daily.  . ergocalciferol (VITAMIN D2) 50000 UNITS capsule Take 50,000 Units by mouth once a week.  Marland Kitchen HYDROcodone-acetaminophen (NORCO) 7.5-325 MG per tablet Take 1 tablet by mouth 2 (two) times daily. For pain  . hydrocortisone 2.5 % cream daily as needed.  . hyoscyamine (LEVSIN SL) 0.125 MG SL tablet DISSOLVE 1 TABLET UNDER THE TONGUE EVERY 6 HOURS AS NEEDED  . levothyroxine (SYNTHROID, LEVOTHROID) 50 MCG tablet Once daily  . LINZESS 290 MCG CAPS capsule TAKE 1 CAPSULE (290 MCG TOTAL) BY MOUTH DAILY BEFORE BREAKFAST.  Marland Kitchen LORazepam (ATIVAN) 1 MG tablet Take 1 tablet by mouth at bedtime.  Marland Kitchen losartan (COZAAR) 100 MG tablet Take 100 mg by mouth daily.  . pregabalin (LYRICA) 50 MG capsule Take 50 mg by mouth daily.   . ranitidine (ZANTAC) 150 MG tablet Take 1 tablet (150 mg total) by mouth 2 (two) times daily.  . SUMAtriptan (IMITREX) 50 MG tablet Take 50 mg by mouth every 2 (two) hours  as needed. For migraine  . [DISCONTINUED] Coenzyme Q10 (CO Q 10 PO) Take 1 tablet by mouth daily.  . [DISCONTINUED] fluticasone (FLONASE) 50 MCG/ACT nasal spray Place 2 sprays into both nostrils daily. (Patient taking differently: Place 2 sprays into both nostrils as needed. )  . [DISCONTINUED] fluticasone (FLOVENT HFA) 110 MCG/ACT inhaler Inhale 2 puffs into the lungs at bedtime.  . [DISCONTINUED] hyoscyamine (LEVSIN SL) 0.125 MG SL  tablet DISSOLVE 1 TABLET UNDER THE TONGUE EVERY 6 HOURS AS NEEDED  . [DISCONTINUED] Multiple Vitamins-Minerals (ICAPS PO) Take 1 tablet by mouth daily.   No facility-administered encounter medications on file as of 07/06/2017.     Allergies as of 07/06/2017 - Review Complete 07/06/2017  Allergen Reaction Noted  . Crestor [rosuvastatin calcium]  11/17/2015  . Zocor [simvastatin]  11/17/2015  . Enalapril  11/17/2015  . Lamictal [lamotrigine]  11/17/2015  . Nitrofuran derivatives Nausea Only 12/29/2014  . Erythromycin Other (See Comments)   . Vivelle [estradiol]  11/17/2015  . Ciprofloxacin Nausea Only 12/12/2011  . Ciprofloxacin hcl  11/17/2015  . Latex Rash 03/03/2012  . Shellfish allergy Rash 03/03/2012  . Sulfa antibiotics Nausea Only 12/12/2011    Past Medical History:  Diagnosis Date  . ADD (attention deficit disorder)   . Anxiety   . Arthritis   . Asthma   . Chronic kidney disease    CKD Stage 3  . Chronic renal insufficiency, stage III (moderate) ([facility])   . Depression    major depression  . Dermatitis   . Diverticulosis   . Essential tremor 05/17/2012  . Fibromyalgia   . GERD (gastroesophageal reflux disease)   . Hyperlipidemia   . Hypertension   . Memory loss 05/17/2012  . Migraines   . Occasional tremors   . Pneumonia   . TIA (transient ischemic attack)    History of 2 TIA's per pt    Past Surgical History:  Procedure Laterality Date  . ABDOMINAL HYSTERECTOMY    . APPENDECTOMY    . CATARACT EXTRACTION Bilateral   . TONSILLECTOMY AND ADENOIDECTOMY     age 23  . TOTAL HIP ARTHROPLASTY Bilateral 2009   Dr Adriana Mccallum    Family History  Problem Relation Age of Onset  . Liver cancer Mother   . Pulmonary embolism Mother   . Prostate cancer Father   . Stroke Maternal Grandmother   . Heart failure Maternal Grandfather   . Emphysema Maternal Grandfather   . Congestive Heart Failure Maternal Grandfather   . Coronary artery disease Paternal Grandmother   .  Coronary artery disease Paternal Grandfather   . Prostate cancer Paternal Uncle        x 2  . ALS Paternal Aunt   . Brain cancer Paternal Aunt   . Breast cancer Paternal Aunt   . Lung cancer Maternal Aunt   . Depression Brother        suicide  . Diabetes Paternal Aunt        x 2  . Breast cancer Cousin   . Colon cancer Neg Hx   . Esophageal cancer Neg Hx   . Rectal cancer Neg Hx   . Stomach cancer Neg Hx     Social History   Socioeconomic History  . Marital status: Married    Spouse name: Not on file  . Number of children: 2  . Years of education: hs  . Highest education level: Not on file  Occupational History  . Occupation: Retired    Fish farm manager:  RETIRED  Social Needs  . Financial resource strain: Not on file  . Food insecurity:    Worry: Not on file    Inability: Not on file  . Transportation needs:    Medical: Not on file    Non-medical: Not on file  Tobacco Use  . Smoking status: Never Smoker  . Smokeless tobacco: Never Used  Substance and Sexual Activity  . Alcohol use: No    Alcohol/week: 0.0 oz  . Drug use: No  . Sexual activity: Never  Lifestyle  . Physical activity:    Days per week: Not on file    Minutes per session: Not on file  . Stress: Not on file  Relationships  . Social connections:    Talks on phone: Not on file    Gets together: Not on file    Attends religious service: Not on file    Active member of club or organization: Not on file    Attends meetings of clubs or organizations: Not on file    Relationship status: Not on file  . Intimate partner violence:    Fear of current or ex partner: Not on file    Emotionally abused: Not on file    Physically abused: Not on file    Forced sexual activity: Not on file  Other Topics Concern  . Not on file  Social History Narrative   Patient lives at home with her husband [name]).    Retired.   Education high school.   Right handed.   Caffeine two cups of coffee daily and one glass of sweet  tea.      Review of systems: Review of Systems  Constitutional: Negative for fever and chills. Positive for lack of energy HENT: Positive for post nasal drip and difficulty hearing   Eyes: Negative for blurred vision.  Respiratory: Negative for cough, shortness of breath and wheezing.   Cardiovascular: Negative for chest pain and palpitations.  Gastrointestinal: as per HPI Genitourinary: Negative for dysuria, urgency, frequency and hematuria.  Musculoskeletal: Positive for myalgias, back pain and joint pain.  Skin: Negative for itching and rash.  Neurological: Negative for dizziness, tremors, focal weakness, seizures and loss of consciousness.  Endo/Heme/Allergies: Positive for seasonal allergies.  Psychiatric/Behavioral: Negative for suicidal ideas and hallucinations. Positive for depression and anxiety All other systems reviewed and are negative.   Physical Exam: Vitals:   07/06/17 1541  BP: 102/64  Pulse: 85   Body mass index is 27.29 kg/m. Gen:      No acute distress HEENT:  EOMI, sclera anicteric Neck:     No masses; no thyromegaly Lungs:    Clear to auscultation bilaterally; normal respiratory effort CV:         Regular rate and rhythm; no murmurs Abd:      + bowel sounds; soft, non-tender; no palpable masses, no distension Ext:    No edema; adequate peripheral perfusion Skin:      Warm and dry; no rash Neuro: alert and oriented x 3 Psych: normal mood and affect  Data Reviewed:  Reviewed labs, radiology imaging, old records and pertinent past GI work up   Assessment and Plan/Recommendations:  77 year old female with history of chronic GERD and irritable bowel syndrome predominant constipation here for follow-up visit  GERD: Patient is worried about potential side effects with PPI use, she is reluctant to restart PPI Continue Zantac twice daily as needed Antireflux measures and lifestyle modification  Constipation: Continue Linzess 290 mcg  daily  Irritable  bowel syndrome: FD guard and IBgard 1 capsule up to 3 times daily as needed  Colorectal cancer screening: Increased risk with history of tubular adenoma Due for surveillance colonoscopy October 2022  Greater than 50% of the time used for counseling as well as treatment plan and follow-up. She had multiple questions which were answered to her satisfaction  K. Denzil Magnuson , MD [phone]    CC: Lajean Manes, MD

## 2017-07-12 DIAGNOSIS — M545 Low back pain: Secondary | ICD-10-CM | POA: Diagnosis not present

## 2017-07-12 DIAGNOSIS — Z6827 Body mass index (BMI) 27.0-27.9, adult: Secondary | ICD-10-CM | POA: Diagnosis not present

## 2017-07-12 DIAGNOSIS — M2550 Pain in unspecified joint: Secondary | ICD-10-CM | POA: Diagnosis not present

## 2017-07-12 DIAGNOSIS — M150 Primary generalized (osteo)arthritis: Secondary | ICD-10-CM | POA: Diagnosis not present

## 2017-07-12 DIAGNOSIS — R5383 Other fatigue: Secondary | ICD-10-CM | POA: Diagnosis not present

## 2017-07-12 DIAGNOSIS — E663 Overweight: Secondary | ICD-10-CM | POA: Diagnosis not present

## 2017-07-12 DIAGNOSIS — M797 Fibromyalgia: Secondary | ICD-10-CM | POA: Diagnosis not present

## 2017-07-14 ENCOUNTER — Encounter: Payer: Self-pay | Admitting: Gastroenterology

## 2017-07-24 ENCOUNTER — Ambulatory Visit (INDEPENDENT_AMBULATORY_CARE_PROVIDER_SITE_OTHER): Payer: Medicare Other | Admitting: Adult Health

## 2017-07-24 ENCOUNTER — Encounter: Payer: Self-pay | Admitting: Adult Health

## 2017-07-24 VITALS — BP 108/74 | HR 79 | Ht 64.0 in | Wt 159.2 lb

## 2017-07-24 DIAGNOSIS — F329 Major depressive disorder, single episode, unspecified: Secondary | ICD-10-CM

## 2017-07-24 DIAGNOSIS — R251 Tremor, unspecified: Secondary | ICD-10-CM | POA: Diagnosis not present

## 2017-07-24 DIAGNOSIS — F32A Depression, unspecified: Secondary | ICD-10-CM

## 2017-07-24 DIAGNOSIS — N390 Urinary tract infection, site not specified: Secondary | ICD-10-CM | POA: Diagnosis not present

## 2017-07-24 MED ORDER — ACETAZOLAMIDE 125 MG PO TABS: 125.0000 mg | ORAL_TABLET | Freq: Every day | ORAL | 3 refills | Status: DC

## 2017-07-24 NOTE — Progress Notes (Signed)
I have read the note, and I agree with the clinical assessment and plan.  Luisdavid Hamblin K Jaryn Hocutt   

## 2017-07-24 NOTE — Progress Notes (Signed)
PATIENT: [name] DOB: 1940-09-10  REASON FOR VISIT: follow up HISTORY FROM: patient  HISTORY OF PRESENT ILLNESS: Today 07/24/17:  [name] is a 77 year old female with a history of essential tremor.  She returns today for follow-up.  She remains on Diamox 125 mg daily.  She reports that this controls her tremor.  She reports that she continues to have trouble with depression.  She states that she continues to live in a toxic environment.  She actually has not followed up with Dr. Robina Ade recently nor has she is seeing a psychologist as recommended by Dr. Robina Ade.  She returns today for evaluation.  HISTORY [name] is a 77 year old right-handed white female with a history of an essential tremor. The tremor is relatively well controlled on low-dose Diamox taking one 125 mg tablet daily. The patient was not able tolerate 2 tablets daily. The patient continues to complain of ongoing severe depression with daily thoughts of suicide. She claims that her home environment is intolerable. Having said that, she has not followed directions of her psychiatrist to seek out a counselor or psychologist. She sleeps 10 or 11 hours at night and then sleeps off and on throughout the day, and complains that she is too fatigued to do anything such as walk or exercise. She does not want to go outside the house even though she claims that the home environment is too stressful for her. The patient has ongoing daily tension-type headache with pressure sensations in the head. She returns this office for an evaluation.    REVIEW OF SYSTEMS: Out of a complete 14 system review of symptoms, the patient complains only of the following symptoms, and all other reviewed systems are negative.  Activity change, appetite change, fatigue, unexpected weight change, excessive sweating, ringing in ears, runny nose, trouble swallowing, chest pain, leg swelling, palpitations, murmur, wheezing, shortness of breath, blurred  vision, loss of vision, light sensitivity, eye redness, eye itching, heat intolerance, flushing, swollen abdomen, abdominal pain, constipation environmental allergies, food allergies, difficulty urinating, painful urination, incontinence of bladder, frequency of urination, urgency, urine decrease, joint pain, back pain, aching muscles, walking difficulty, neck pain, neck stiffness, rash, moles, itching, agitation, depression, nervous/anxious, suicidal thoughts, weakness, tremors, passing out, headache, dizziness, memory loss, bruise/bleed easily  ALLERGIES: Allergies  Allergen Reactions  . Crestor [Rosuvastatin Calcium]     myalgia  . Zocor [Simvastatin]     myalgia  . Enalapril     swelling  . Lamictal [Lamotrigine]     rash  . Nitrofuran Derivatives Nausea Only  . Erythromycin Other (See Comments)    Stomach burning  . Vivelle [Estradiol]     rash  . Ciprofloxacin Nausea Only  . Ciprofloxacin Hcl     rash  . Latex Rash  . Shellfish Allergy Rash  . Sulfa Antibiotics Nausea Only    HOME MEDICATIONS: Outpatient Medications Prior to Visit  Medication Sig Dispense Refill  . acetaZOLAMIDE (DIAMOX) 125 MG tablet TAKE ONE TABLET BY MOUTH TWICE DAILY 180 tablet 3  . albuterol (PROVENTIL HFA;VENTOLIN HFA) 108 (90 Base) MCG/ACT inhaler Inhale 2 puffs into the lungs every 6 (six) hours as needed for wheezing or shortness of breath. 1 Inhaler 4  . BREO ELLIPTA 100-25 MCG/INH AEPB TAKE 1 PUFF BY MOUTH EVERY DAY 60 each 2  . clopidogrel (PLAVIX) 75 MG tablet Take 1 tablet (75 mg total) by mouth daily. 90 tablet 3  . DULoxetine (CYMBALTA) 30 MG capsule Take 30 mg by mouth  daily.    . ergocalciferol (VITAMIN D2) 50000 UNITS capsule Take 50,000 Units by mouth once a week.    Marland Kitchen HYDROcodone-acetaminophen (NORCO) 7.5-325 MG per tablet Take 1 tablet by mouth 2 (two) times daily. For pain    . hydrocortisone 2.5 % cream daily as needed.    . hyoscyamine (LEVSIN SL) 0.125 MG SL tablet DISSOLVE 1  TABLET UNDER THE TONGUE EVERY 6 HOURS AS NEEDED 60 tablet 0  . levothyroxine (SYNTHROID, LEVOTHROID) 50 MCG tablet Once daily    . LINZESS 290 MCG CAPS capsule TAKE 1 CAPSULE (290 MCG TOTAL) BY MOUTH DAILY BEFORE BREAKFAST. 30 capsule 3  . LORazepam (ATIVAN) 1 MG tablet Take 1 tablet by mouth at bedtime.    Marland Kitchen losartan (COZAAR) 100 MG tablet Take 100 mg by mouth daily.    . pregabalin (LYRICA) 50 MG capsule Take 50 mg by mouth daily.     . ranitidine (ZANTAC) 150 MG tablet Take 1 tablet (150 mg total) by mouth 2 (two) times daily. 60 tablet 3  . SUMAtriptan (IMITREX) 50 MG tablet Take 50 mg by mouth every 2 (two) hours as needed. For migraine     No facility-administered medications prior to visit.     PAST MEDICAL HISTORY: Past Medical History:  Diagnosis Date  . ADD (attention deficit disorder)   . Anxiety   . Arthritis   . Asthma   . Chronic kidney disease    CKD Stage 3  . Chronic renal insufficiency, stage III (moderate) (HCC)   . Depression    major depression  . Dermatitis   . Diverticulosis   . Essential tremor 05/17/2012  . Fibromyalgia   . GERD (gastroesophageal reflux disease)   . Hyperlipidemia   . Hypertension   . Memory loss 05/17/2012  . Migraines   . Occasional tremors   . Pneumonia   . TIA (transient ischemic attack)    History of 2 TIA's per pt    PAST SURGICAL HISTORY: Past Surgical History:  Procedure Laterality Date  . ABDOMINAL HYSTERECTOMY    . APPENDECTOMY    . CATARACT EXTRACTION Bilateral   . TONSILLECTOMY AND ADENOIDECTOMY     age 89  . TOTAL HIP ARTHROPLASTY Bilateral 2009   Dr Adriana Mccallum    FAMILY HISTORY: Family History  Problem Relation Age of Onset  . Liver cancer Mother   . Pulmonary embolism Mother   . Prostate cancer Father   . Stroke Maternal Grandmother   . Heart failure Maternal Grandfather   . Emphysema Maternal Grandfather   . Congestive Heart Failure Maternal Grandfather   . Coronary artery disease Paternal Grandmother    . Coronary artery disease Paternal Grandfather   . Prostate cancer Paternal Uncle        x 2  . ALS Paternal Aunt   . Brain cancer Paternal Aunt   . Breast cancer Paternal Aunt   . Lung cancer Maternal Aunt   . Depression Brother        suicide  . Diabetes Paternal Aunt        x 2  . Breast cancer Cousin   . Colon cancer Neg Hx   . Esophageal cancer Neg Hx   . Rectal cancer Neg Hx   . Stomach cancer Neg Hx     SOCIAL HISTORY: Social History   Socioeconomic History  . Marital status: Married    Spouse name: Not on file  . Number of children: 2  . Years of education:  hs  . Highest education level: Not on file  Occupational History  . Occupation: Retired    Fish farm manager: RETIRED  Social Needs  . Financial resource strain: Not on file  . Food insecurity:    Worry: Not on file    Inability: Not on file  . Transportation needs:    Medical: Not on file    Non-medical: Not on file  Tobacco Use  . Smoking status: Never Smoker  . Smokeless tobacco: Never Used  Substance and Sexual Activity  . Alcohol use: No    Alcohol/week: 0.0 oz  . Drug use: No  . Sexual activity: Never  Lifestyle  . Physical activity:    Days per week: Not on file    Minutes per session: Not on file  . Stress: Not on file  Relationships  . Social connections:    Talks on phone: Not on file    Gets together: Not on file    Attends religious service: Not on file    Active member of club or organization: Not on file    Attends meetings of clubs or organizations: Not on file    Relationship status: Not on file  . Intimate partner violence:    Fear of current or ex partner: Not on file    Emotionally abused: Not on file    Physically abused: Not on file    Forced sexual activity: Not on file  Other Topics Concern  . Not on file  Social History Narrative   Patient lives at home with her husband [name]).    Retired.   Education high school.   Right handed.   Caffeine two cups of coffee daily  and one glass of sweet tea.      PHYSICAL EXAM  Vitals:   07/24/17 1423  BP: 108/74  Pulse: 79  SpO2: 98%  Weight: 159 lb 3.2 oz (72.2 kg)  Height: 5\' 4"  (1.626 m)   Body mass index is 27.33 kg/m.  Generalized: Well developed, in no acute distress   Neurological examination  Mentation: Alert oriented to time, place, history taking. Follows all commands speech and language fluent Cranial nerve II-XII: Pupils were equal round reactive to light. Extraocular movements were full, visual field were full on confrontational test. Facial sensation and strength were normal. Uvula tongue midline. Head turning and shoulder shrug  were normal and symmetric. Motor: The motor testing reveals 5 over 5 strength of all 4 extremities. Good symmetric motor tone is noted throughout.  Sensory: Sensory testing is intact to soft touch on all 4 extremities. No evidence of extinction is noted.  Coordination: Cerebellar testing reveals good finger-nose-finger and heel-to-shin bilaterally.  Gait and station: Gait is normal. Reflexes: Deep tendon reflexes are symmetric and normal bilaterally.   DIAGNOSTIC DATA (LABS, IMAGING, TESTING) - I reviewed patient records, labs, notes, testing and imaging myself where available.  Lab Results  Component Value Date   WBC 7.2 03/03/2012   HGB 14.1 03/03/2012   HCT 40.2 03/03/2012   MCV 92.8 03/03/2012   PLT 255 03/03/2012      Component Value Date/Time   NA 138 12/29/2014 1519   K 4.5 12/29/2014 1519   CL 100 12/29/2014 1519   CO2 26 12/29/2014 1519   GLUCOSE 78 12/29/2014 1519   GLUCOSE 87 05/29/2012 1405   BUN 17 12/29/2014 1519   CREATININE 0.98 12/29/2014 1519   CALCIUM 10.1 12/29/2014 1519   PROT 6.9 12/29/2014 1519   ALBUMIN 4.2 12/29/2014 1519  AST 18 12/29/2014 1519   ALT 12 12/29/2014 1519   ALKPHOS 80 12/29/2014 1519   BILITOT 0.5 12/29/2014 1519   GFRNONAA 58 (L) 12/29/2014 1519   GFRAA 67 12/29/2014 1519   Lab Results  Component  Value Date   CHOL 254 (H) 03/04/2012   HDL 30 (L) 03/04/2012   LDLCALC 158 (H) 03/04/2012   TRIG 328 (H) 03/04/2012   CHOLHDL 8.5 03/04/2012     ASSESSMENT AND PLAN 77 y.o. year old female  has a past medical history of ADD (attention deficit disorder), Anxiety, Arthritis, Asthma, Chronic kidney disease, Chronic renal insufficiency, stage III (moderate) ([facility]), Depression, Dermatitis, Diverticulosis, Essential tremor (05/17/2012), Fibromyalgia, GERD (gastroesophageal reflux disease), Hyperlipidemia, Hypertension, Memory loss (05/17/2012), Migraines, Occasional tremors, Pneumonia, and TIA (transient ischemic attack). here with:  1.  Tremor 2.  Depression  The patient will continue on Diamox 125 mg daily.  I advised patient that she should follow-up with her psychiatrist.  He voiced understanding.  She will follow-up in 1 year or sooner if needed.    Ward Givens, MSN, NP-C 07/24/2017, 2:25 PM [facility] [address] [place], [place] [address] [phone]

## 2017-07-24 NOTE — Patient Instructions (Signed)
Your Plan:  Continue Diamox Follow-up with psychiatrist If your symptoms worsen or you develop new symptoms please let us know.   Thank you for coming to see [place] at [facility]. I hope we have been able to provide you high quality care today.  You may receive a patient satisfaction survey over the next few weeks. We would appreciate your feedback and comments so that we may continue to improve ourselves and the health of our patients.

## 2017-08-21 ENCOUNTER — Telehealth: Payer: Self-pay | Admitting: Gastroenterology

## 2017-08-21 ENCOUNTER — Other Ambulatory Visit: Payer: Self-pay | Admitting: Gastroenterology

## 2017-08-21 ENCOUNTER — Telehealth: Payer: Self-pay | Admitting: Adult Health

## 2017-08-21 NOTE — Telephone Encounter (Signed)
Patient calling to discuss discontinuing  clopidogrel (PLAVIX) 75 MG tablet.

## 2017-08-21 NOTE — Telephone Encounter (Signed)
Called the pt back. Pt has history of TIA's and is on plavix for stroke prevention. Prior to plavix the pt was on asprin. She stopped the asprin per GI doc recommendation due to the patient developing ulcer. Dr Jannifer Franklin then placed the patient on plavix. Pt states that she called before and spoke with him and informed him that since taking the plavix her head feels funny and she feels causes her depression to be worse. Dr Jannifer Franklin encouraged her to continue to use it and give it time and call back. Pt states that the feeling is the same. She states she has not taken in the last 2 days and she feels so much better. She has contacted her GI doc to ask if in there opinion is it safe for her to go back to taking asprin instead of the plavix. She has not heard a response back. She asked that if she cant go back to asprin can she take the plavix every other day. I informed her typically it works best as a daily medication for stroke prevention but that I would make her concerns aware. I have asked that the patient call me back with what the GI MD recommends also. Pt agreed to call me back and let me know what they say.

## 2017-08-21 NOTE — Telephone Encounter (Signed)
Pt states she is needing to know if she can continue to take Aspirin Dr.Willis her neurologist is also needing to know in order to advise her further. Best call back # [phone].

## 2017-08-21 NOTE — Telephone Encounter (Signed)
Ideally she should take Plavix every day.  The patient has a chronic history of depression.  I am not sure that Plavix is the cause of this.  We can get the input from her GI doctor.  If he has no concerns about switching her to aspirin and we can do that.  But we would need to verify that with that MD.

## 2017-08-22 DIAGNOSIS — R102 Pelvic and perineal pain: Secondary | ICD-10-CM | POA: Diagnosis not present

## 2017-08-22 NOTE — Telephone Encounter (Signed)
Pt is requesting to speak with nurse about these medications. Best call back # [phone].

## 2017-08-22 NOTE — Telephone Encounter (Signed)
[name] with her stopping plavix and switching to Aspirin EC 81 mg daily

## 2017-08-22 NOTE — Telephone Encounter (Signed)
No contraindication for ASA from GI standpoint, she will need to discuss with PMD and neurology. Thanks

## 2017-08-22 NOTE — Telephone Encounter (Signed)
She is not on a PPI. Takes Zantac. She was taken off ASA because she had worsening GERD on it.

## 2017-08-22 NOTE — Telephone Encounter (Signed)
Called the patient and LVM informing her that after discussing with GI, PCP and Ward Givens, NP everyone agrees the patient can stop taking the plavix and start a 81 mg EC asprin a day. Instructed the pt to call me if she had any other questions.

## 2017-08-22 NOTE — Telephone Encounter (Signed)
Left the patient a message that GI is okay with her using ASA in place of Plavix if the neurologist feels it is okay also. From a GI standpoint, she will be okay to change.

## 2017-09-12 DIAGNOSIS — M150 Primary generalized (osteo)arthritis: Secondary | ICD-10-CM | POA: Diagnosis not present

## 2017-09-12 DIAGNOSIS — Z6827 Body mass index (BMI) 27.0-27.9, adult: Secondary | ICD-10-CM | POA: Diagnosis not present

## 2017-09-12 DIAGNOSIS — E663 Overweight: Secondary | ICD-10-CM | POA: Diagnosis not present

## 2017-09-12 DIAGNOSIS — M2550 Pain in unspecified joint: Secondary | ICD-10-CM | POA: Diagnosis not present

## 2017-09-12 DIAGNOSIS — R768 Other specified abnormal immunological findings in serum: Secondary | ICD-10-CM | POA: Diagnosis not present

## 2017-09-12 DIAGNOSIS — M545 Low back pain: Secondary | ICD-10-CM | POA: Diagnosis not present

## 2017-09-12 DIAGNOSIS — M797 Fibromyalgia: Secondary | ICD-10-CM | POA: Diagnosis not present

## 2017-09-25 ENCOUNTER — Other Ambulatory Visit: Payer: Self-pay | Admitting: Pulmonary Disease

## 2017-09-26 ENCOUNTER — Other Ambulatory Visit: Payer: Self-pay | Admitting: Pulmonary Disease

## 2017-10-13 ENCOUNTER — Other Ambulatory Visit: Payer: Self-pay | Admitting: Geriatric Medicine

## 2017-10-13 DIAGNOSIS — G894 Chronic pain syndrome: Secondary | ICD-10-CM | POA: Diagnosis not present

## 2017-10-13 DIAGNOSIS — M797 Fibromyalgia: Secondary | ICD-10-CM | POA: Diagnosis not present

## 2017-10-13 DIAGNOSIS — E559 Vitamin D deficiency, unspecified: Secondary | ICD-10-CM | POA: Diagnosis not present

## 2017-10-13 DIAGNOSIS — Z79899 Other long term (current) drug therapy: Secondary | ICD-10-CM | POA: Diagnosis not present

## 2017-10-13 DIAGNOSIS — N390 Urinary tract infection, site not specified: Secondary | ICD-10-CM | POA: Diagnosis not present

## 2017-10-13 DIAGNOSIS — I129 Hypertensive chronic kidney disease with stage 1 through stage 4 chronic kidney disease, or unspecified chronic kidney disease: Secondary | ICD-10-CM | POA: Diagnosis not present

## 2017-10-13 DIAGNOSIS — G441 Vascular headache, not elsewhere classified: Secondary | ICD-10-CM | POA: Diagnosis not present

## 2017-10-13 DIAGNOSIS — N183 Chronic kidney disease, stage 3 (moderate): Secondary | ICD-10-CM | POA: Diagnosis not present

## 2017-10-13 DIAGNOSIS — K5900 Constipation, unspecified: Secondary | ICD-10-CM | POA: Diagnosis not present

## 2017-10-19 ENCOUNTER — Other Ambulatory Visit: Payer: Self-pay | Admitting: Gastroenterology

## 2017-10-24 ENCOUNTER — Ambulatory Visit
Admission: RE | Admit: 2017-10-24 | Discharge: 2017-10-24 | Disposition: A | Discharge status: Home / Self Care | Payer: Medicare Other | Source: Ambulatory Visit | Attending: Geriatric Medicine | Admitting: Geriatric Medicine

## 2017-10-24 DIAGNOSIS — G441 Vascular headache, not elsewhere classified: Secondary | ICD-10-CM

## 2017-10-24 DIAGNOSIS — H538 Other visual disturbances: Secondary | ICD-10-CM | POA: Diagnosis not present

## 2017-10-24 DIAGNOSIS — R410 Disorientation, unspecified: Secondary | ICD-10-CM | POA: Diagnosis not present

## 2017-10-24 MED ORDER — IOPAMIDOL (ISOVUE-300) INJECTION 61%
75.0000 mL | Freq: Once | INTRAVENOUS | Status: AC | PRN
  Administered 2017-10-24: 75 mL via INTRAVENOUS

## 2017-10-25 ENCOUNTER — Other Ambulatory Visit: Payer: Self-pay | Admitting: Pulmonary Disease

## 2017-10-26 ENCOUNTER — Other Ambulatory Visit: Payer: Self-pay | Admitting: Pulmonary Disease

## 2017-10-30 ENCOUNTER — Other Ambulatory Visit: Payer: Self-pay | Admitting: Gastroenterology

## 2017-10-30 ENCOUNTER — Other Ambulatory Visit: Payer: Self-pay | Admitting: Pulmonary Disease

## 2017-10-31 ENCOUNTER — Other Ambulatory Visit: Payer: Self-pay | Admitting: Pulmonary Disease

## 2017-10-31 ENCOUNTER — Telehealth: Payer: Self-pay | Admitting: Pulmonary Disease

## 2017-10-31 MED ORDER — FLUTICASONE FUROATE-VILANTEROL 100-25 MCG/INH IN AEPB: INHALATION_SPRAY | RESPIRATORY_TRACT | 0 refills | Status: DC

## 2017-10-31 NOTE — Telephone Encounter (Signed)
Called and spoke with the patient to verify the medication and pharmacy and made sure to schedule her for a follow up apt nothing further needed at this time.

## 2017-11-13 ENCOUNTER — Ambulatory Visit (INDEPENDENT_AMBULATORY_CARE_PROVIDER_SITE_OTHER): Payer: Medicare Other | Admitting: Pulmonary Disease

## 2017-11-13 ENCOUNTER — Encounter: Payer: Self-pay | Admitting: Pulmonary Disease

## 2017-11-13 DIAGNOSIS — Z23 Encounter for immunization: Secondary | ICD-10-CM

## 2017-11-13 DIAGNOSIS — J4520 Mild intermittent asthma, uncomplicated: Secondary | ICD-10-CM | POA: Diagnosis not present

## 2017-11-13 DIAGNOSIS — B370 Candidal stomatitis: Secondary | ICD-10-CM

## 2017-11-13 NOTE — Assessment & Plan Note (Signed)
Nystatin swish and swallow 5 mL twice daily x 7 days

## 2017-11-13 NOTE — Progress Notes (Signed)
   Subjective:    Patient ID: [name], female    DOB: [birth year], 77 y.o.   MRN: [id number]  HPI  77 yo never smoker for FU of asthma She has multiple medical problems including movement disorder, hypertension dyslipidemia , asthma, depression and fibromyalgia  08/2015 - RX was stepped up to Breo from [place] [facility]  Patient presents with  . Follow-up    6 month follow up for asthma. Per patient, she has been feeling ok since last visit. Memory Dance has been working well for her asthma.     On her last visit we talked about stepping down from [place] to [place], she did not do that and remains on Breo. She complains of hoarseness of voice.  Breathing is mostly well controlled, no wheezing or nocturnal symptoms   Significant tests/ events reviewed  CT chest in 07/2007, 05/2012 neg   06/2012 PFT showed Nml FEV1 at 114% , min drop in ratio at 63, Mild obstruction in mid flows at 58% w/ 20% BD response.    Review of Systems neg for any significant sore throat, dysphagia, itching, sneezing, nasal congestion or excess/ purulent secretions, fever, chills, sweats, unintended wt loss, pleuritic or exertional cp, hempoptysis, orthopnea pnd or change in chronic leg swelling. Also denies presyncope, palpitations, heartburn, abdominal pain, nausea, vomiting, diarrhea or change in bowel or urinary habits, dysuria,hematuria, rash, arthralgias, visual complaints, headache, numbness weakness or ataxia.     Objective:   Physical Exam  Gen. Pleasant, well-nourished, in no distress ENT - oral thrush, no post nasal drip Neck: No JVD, no thyromegaly, no carotid bruits Lungs: no use of accessory muscles, no dullness to percussion, clear without rales or rhonchi  Cardiovascular: Rhythm regular, heart sounds  normal, no murmurs or gallops, no peripheral edema Musculoskeletal: No deformities, no cyanosis or clubbing         Assessment & Plan:

## 2017-11-13 NOTE — Assessment & Plan Note (Signed)
Refills on Brio.  Flu shot.  try to stop Breo for 7 days and see how you feel,  Call me to report in 1 month Use albuterol as needed only

## 2017-11-13 NOTE — Patient Instructions (Signed)
Refills on Brio.  Flu shot.  Nystatin swish and swallow 5 mL twice daily x 7 days Try to stop Breo for 7 days and see how you feel,  Call me to report in 1 month

## 2017-11-14 ENCOUNTER — Other Ambulatory Visit: Payer: Self-pay

## 2017-11-14 MED ORDER — NYSTATIN 100000 UNIT/ML MT SUSP: OROMUCOSAL | 0 refills | Status: DC

## 2017-11-15 ENCOUNTER — Telehealth: Payer: Self-pay | Admitting: Pulmonary Disease

## 2017-11-15 NOTE — Telephone Encounter (Signed)
Called and spoke to pt.  Pt stated that she read the patient information for Nystatin, which states to make provider aware of any hx of kidney disease before using. Pt is concerned about taking this medication, as she has stage 3 kidney disease.   Pt is requesting an alternative.  RA please advise. Thanks

## 2017-11-15 NOTE — Telephone Encounter (Signed)
Pt is aware of below message and voiced her understanding. Pt also stated that she is concerned about side effects, as nausea and vomiting were listed.  I have advised pt to contact our office if she develops any side effects.  Nothing further is needed.   Will route to RA as an [place].

## 2017-11-15 NOTE — Telephone Encounter (Signed)
She was given low-dose and for a short period of time, should not be a problem with kidney disease

## 2017-11-17 ENCOUNTER — Telehealth: Payer: Self-pay | Admitting: Pulmonary Disease

## 2017-11-17 MED ORDER — FLUCONAZOLE 100 MG PO TABS: ORAL_TABLET | ORAL | 0 refills | Status: DC

## 2017-11-17 NOTE — Telephone Encounter (Signed)
Called and spoke with patient she has only been on nystatin swish and swallow for 2 days. She noticed small blisters starting on day one, and worse on day two. Patient states she has stopped her Breo but denies increased SOB, rash, fever, throat swelling.   Dr. Elsworth Soho please advise.

## 2017-11-17 NOTE — Telephone Encounter (Signed)
Pt is calling back [phone]

## 2017-11-17 NOTE — Telephone Encounter (Signed)
Okay to stop the statin, please list as allergy. Start Diflucan 100 mg daily for 3 days instead

## 2017-11-17 NOTE — Telephone Encounter (Signed)
Called spoke with patient. Instructed her per RA's rec to stop nystatin and a RX would be called in for diflucan to pharmacy verified by patient.   Also allergy added.  Nothing further needed.

## 2017-11-23 DIAGNOSIS — L259 Unspecified contact dermatitis, unspecified cause: Secondary | ICD-10-CM | POA: Diagnosis not present

## 2017-11-23 DIAGNOSIS — R000 Tachycardia, unspecified: Secondary | ICD-10-CM | POA: Diagnosis not present

## 2017-11-27 ENCOUNTER — Other Ambulatory Visit: Payer: Self-pay | Admitting: Gastroenterology

## 2017-12-03 ENCOUNTER — Telehealth: Payer: Self-pay | Admitting: Physician Assistant

## 2017-12-03 MED ORDER — FAMOTIDINE 20 MG PO TABS: 20.0000 mg | ORAL_TABLET | Freq: Three times a day (TID) | ORAL | 5 refills | Status: DC

## 2017-12-03 NOTE — Telephone Encounter (Signed)
12/03/2017 1355  Patient called our clinic to explain that she had a conversation with Dr. Hilarie Fredrickson yesterday in regards to switching from her Zantac to Pepcid due to the side effects that she has heard about.  She has been to "all the stores in my region" and none of them have the medicine.  Advised to continue Zantac until she can get her hands on Pepcid.  Did go ahead and send her in a prescription in case this makes it easier for her.  Advised her to call back if she has any problems.   Prescribed: Pepcid 20 TID before breakfast and dinner and at bedtime #90 refill x5  Ellouise Newer, PA-C

## 2017-12-19 ENCOUNTER — Other Ambulatory Visit: Payer: Self-pay | Admitting: Gastroenterology

## 2017-12-24 ENCOUNTER — Other Ambulatory Visit: Payer: Self-pay | Admitting: Gastroenterology

## 2018-01-06 ENCOUNTER — Telehealth: Payer: Self-pay | Admitting: Gastroenterology

## 2018-01-06 NOTE — Telephone Encounter (Signed)
Pt relates loose watery stools for the past 4 days occuring 3-4 times/day without bleeding, abdominal pain, N/V, fever. She has chronic constipation on is maintained on Linzess which she has not stopped. No recent travel, antibiotics or new medications.  Advised holding Linzess until diarrhea has resolved. Imodium AD bid prn if diarrhea persists. Low fat, lactose free, no raw fruits/raw vegetables diet until diarrhea resolves. Call if symptoms do not resolve over next few days.

## 2018-01-09 ENCOUNTER — Other Ambulatory Visit: Payer: Self-pay

## 2018-01-09 ENCOUNTER — Telehealth: Payer: Self-pay | Admitting: Gastroenterology

## 2018-01-09 NOTE — Telephone Encounter (Signed)
Patient states she has been having having liquid stool for the past 7 days and nothing is helping. Patient requesting to speak with nurse for advice.

## 2018-01-09 NOTE — Telephone Encounter (Signed)
She needs a GI pathogen panel sent.  She should also start a single imodium once daily for now.

## 2018-01-09 NOTE — Telephone Encounter (Signed)
Doc of the Day Patient of Dr Woodward Ku who usually has chronic constipation and is maintained on Linzess. She talked with the on-call, Dr Fuller Plan, on 01/06/18. Patient continues to have more than 3 loose bowel movements a day. She has stopped Linzess and made dietary changes as instructed by Dr Fuller Plan. No recent medication changes or recent antibiotics. She does not feel like she can travel to the office due to her diarrhea. But she agrees to do stool studies if it will help her "get some answers." Please advise.

## 2018-01-10 NOTE — Telephone Encounter (Signed)
Patient advised. She will let us know if she can come in for testing. She mentions there is an urgent care office "5 minutes from the house." This is an option also.

## 2018-01-10 NOTE — Telephone Encounter (Signed)
No answer. No voicemail. 

## 2018-01-12 ENCOUNTER — Other Ambulatory Visit: Payer: Self-pay | Admitting: Gastroenterology

## 2018-01-18 DIAGNOSIS — N390 Urinary tract infection, site not specified: Secondary | ICD-10-CM | POA: Diagnosis not present

## 2018-01-18 DIAGNOSIS — R350 Frequency of micturition: Secondary | ICD-10-CM | POA: Diagnosis not present

## 2018-01-18 DIAGNOSIS — J00 Acute nasopharyngitis [common cold]: Secondary | ICD-10-CM | POA: Diagnosis not present

## 2018-01-23 DIAGNOSIS — R000 Tachycardia, unspecified: Secondary | ICD-10-CM | POA: Diagnosis not present

## 2018-01-23 DIAGNOSIS — N183 Chronic kidney disease, stage 3 (moderate): Secondary | ICD-10-CM | POA: Diagnosis not present

## 2018-01-23 DIAGNOSIS — I129 Hypertensive chronic kidney disease with stage 1 through stage 4 chronic kidney disease, or unspecified chronic kidney disease: Secondary | ICD-10-CM | POA: Diagnosis not present

## 2018-01-23 DIAGNOSIS — F331 Major depressive disorder, recurrent, moderate: Secondary | ICD-10-CM | POA: Diagnosis not present

## 2018-01-23 DIAGNOSIS — H02821 Cysts of right upper eyelid: Secondary | ICD-10-CM | POA: Diagnosis not present

## 2018-02-19 ENCOUNTER — Other Ambulatory Visit: Payer: Self-pay | Admitting: Gastroenterology

## 2018-02-20 DIAGNOSIS — M810 Age-related osteoporosis without current pathological fracture: Secondary | ICD-10-CM | POA: Diagnosis not present

## 2018-02-20 DIAGNOSIS — Z1389 Encounter for screening for other disorder: Secondary | ICD-10-CM | POA: Diagnosis not present

## 2018-02-20 DIAGNOSIS — I700 Atherosclerosis of aorta: Secondary | ICD-10-CM | POA: Diagnosis not present

## 2018-02-20 DIAGNOSIS — N3941 Urge incontinence: Secondary | ICD-10-CM | POA: Diagnosis not present

## 2018-02-20 DIAGNOSIS — Z79899 Other long term (current) drug therapy: Secondary | ICD-10-CM | POA: Diagnosis not present

## 2018-02-20 DIAGNOSIS — I129 Hypertensive chronic kidney disease with stage 1 through stage 4 chronic kidney disease, or unspecified chronic kidney disease: Secondary | ICD-10-CM | POA: Diagnosis not present

## 2018-02-20 DIAGNOSIS — E7800 Pure hypercholesterolemia, unspecified: Secondary | ICD-10-CM | POA: Diagnosis not present

## 2018-02-20 DIAGNOSIS — N183 Chronic kidney disease, stage 3 (moderate): Secondary | ICD-10-CM | POA: Diagnosis not present

## 2018-02-20 DIAGNOSIS — Z0000 Encounter for general adult medical examination without abnormal findings: Secondary | ICD-10-CM | POA: Diagnosis not present

## 2018-02-20 DIAGNOSIS — F325 Major depressive disorder, single episode, in full remission: Secondary | ICD-10-CM | POA: Diagnosis not present

## 2018-02-21 DIAGNOSIS — H0011 Chalazion right upper eyelid: Secondary | ICD-10-CM | POA: Diagnosis not present

## 2018-02-22 ENCOUNTER — Other Ambulatory Visit: Payer: Self-pay | Admitting: Geriatric Medicine

## 2018-02-22 DIAGNOSIS — M818 Other osteoporosis without current pathological fracture: Secondary | ICD-10-CM

## 2018-03-15 DIAGNOSIS — M150 Primary generalized (osteo)arthritis: Secondary | ICD-10-CM | POA: Diagnosis not present

## 2018-03-15 DIAGNOSIS — M2550 Pain in unspecified joint: Secondary | ICD-10-CM | POA: Diagnosis not present

## 2018-03-15 DIAGNOSIS — Z6827 Body mass index (BMI) 27.0-27.9, adult: Secondary | ICD-10-CM | POA: Diagnosis not present

## 2018-03-15 DIAGNOSIS — E663 Overweight: Secondary | ICD-10-CM | POA: Diagnosis not present

## 2018-03-15 DIAGNOSIS — M545 Low back pain: Secondary | ICD-10-CM | POA: Diagnosis not present

## 2018-03-15 DIAGNOSIS — M797 Fibromyalgia: Secondary | ICD-10-CM | POA: Diagnosis not present

## 2018-03-15 DIAGNOSIS — R768 Other specified abnormal immunological findings in serum: Secondary | ICD-10-CM | POA: Diagnosis not present

## 2018-03-21 DIAGNOSIS — M810 Age-related osteoporosis without current pathological fracture: Secondary | ICD-10-CM | POA: Diagnosis not present

## 2018-03-21 DIAGNOSIS — N183 Chronic kidney disease, stage 3 (moderate): Secondary | ICD-10-CM | POA: Diagnosis not present

## 2018-03-21 DIAGNOSIS — F325 Major depressive disorder, single episode, in full remission: Secondary | ICD-10-CM | POA: Diagnosis not present

## 2018-03-21 DIAGNOSIS — F322 Major depressive disorder, single episode, severe without psychotic features: Secondary | ICD-10-CM | POA: Diagnosis not present

## 2018-03-21 DIAGNOSIS — E039 Hypothyroidism, unspecified: Secondary | ICD-10-CM | POA: Diagnosis not present

## 2018-03-21 DIAGNOSIS — I10 Essential (primary) hypertension: Secondary | ICD-10-CM | POA: Diagnosis not present

## 2018-03-21 DIAGNOSIS — I129 Hypertensive chronic kidney disease with stage 1 through stage 4 chronic kidney disease, or unspecified chronic kidney disease: Secondary | ICD-10-CM | POA: Diagnosis not present

## 2018-03-26 DIAGNOSIS — H02831 Dermatochalasis of right upper eyelid: Secondary | ICD-10-CM | POA: Diagnosis not present

## 2018-03-26 DIAGNOSIS — H524 Presbyopia: Secondary | ICD-10-CM | POA: Diagnosis not present

## 2018-03-26 DIAGNOSIS — H52203 Unspecified astigmatism, bilateral: Secondary | ICD-10-CM | POA: Diagnosis not present

## 2018-03-26 DIAGNOSIS — H5211 Myopia, right eye: Secondary | ICD-10-CM | POA: Diagnosis not present

## 2018-03-26 DIAGNOSIS — Z961 Presence of intraocular lens: Secondary | ICD-10-CM | POA: Diagnosis not present

## 2018-03-26 DIAGNOSIS — H02834 Dermatochalasis of left upper eyelid: Secondary | ICD-10-CM | POA: Diagnosis not present

## 2018-04-13 DIAGNOSIS — I10 Essential (primary) hypertension: Secondary | ICD-10-CM | POA: Diagnosis not present

## 2018-04-13 DIAGNOSIS — R0602 Shortness of breath: Secondary | ICD-10-CM | POA: Diagnosis not present

## 2018-04-13 DIAGNOSIS — R202 Paresthesia of skin: Secondary | ICD-10-CM | POA: Diagnosis not present

## 2018-04-13 DIAGNOSIS — F439 Reaction to severe stress, unspecified: Secondary | ICD-10-CM | POA: Diagnosis not present

## 2018-04-18 ENCOUNTER — Other Ambulatory Visit: Payer: Medicare Other

## 2018-04-22 ENCOUNTER — Other Ambulatory Visit: Payer: Self-pay | Admitting: Pulmonary Disease

## 2018-04-24 ENCOUNTER — Telehealth: Payer: Self-pay | Admitting: Pulmonary Disease

## 2018-04-24 MED ORDER — FLUTICASONE FUROATE-VILANTEROL 100-25 MCG/INH IN AEPB: INHALATION_SPRAY | RESPIRATORY_TRACT | 2 refills | Status: DC

## 2018-04-24 NOTE — Telephone Encounter (Signed)
Okay to get back on Breo 100 once daily  X 3 months with 2 refills

## 2018-04-24 NOTE — Telephone Encounter (Signed)
Called and spoke with patient she is aware and verbalized understanding nothing further needed.  

## 2018-04-24 NOTE — Telephone Encounter (Signed)
Called and spoke with patient, she stated that she has become short of breath again, she would like to start on the [place] again. Patient stated that the rescue inhaler works but she would like to start back on Breo.   RA please advise, thank you.

## 2018-04-24 NOTE — Telephone Encounter (Signed)
Attempted to call pt but unable to reach and unable to leave a VM due to mailbox not currently accepting messages. Will try to call back later.

## 2018-05-03 DIAGNOSIS — R300 Dysuria: Secondary | ICD-10-CM | POA: Diagnosis not present

## 2018-05-24 ENCOUNTER — Other Ambulatory Visit: Payer: Self-pay | Admitting: Gastroenterology

## 2018-05-30 ENCOUNTER — Telehealth: Payer: Self-pay

## 2018-05-30 NOTE — Telephone Encounter (Signed)
Pt is on MM's waitlist for a sooner appt. I called home and cell number, neither have VM available. If pt still needs a sooner appt, she may see Judson Roch, NP (virtually).

## 2018-06-13 DIAGNOSIS — N183 Chronic kidney disease, stage 3 (moderate): Secondary | ICD-10-CM | POA: Diagnosis not present

## 2018-06-13 DIAGNOSIS — M810 Age-related osteoporosis without current pathological fracture: Secondary | ICD-10-CM | POA: Diagnosis not present

## 2018-06-13 DIAGNOSIS — F322 Major depressive disorder, single episode, severe without psychotic features: Secondary | ICD-10-CM | POA: Diagnosis not present

## 2018-06-13 DIAGNOSIS — I129 Hypertensive chronic kidney disease with stage 1 through stage 4 chronic kidney disease, or unspecified chronic kidney disease: Secondary | ICD-10-CM | POA: Diagnosis not present

## 2018-06-13 DIAGNOSIS — F325 Major depressive disorder, single episode, in full remission: Secondary | ICD-10-CM | POA: Diagnosis not present

## 2018-06-13 DIAGNOSIS — E039 Hypothyroidism, unspecified: Secondary | ICD-10-CM | POA: Diagnosis not present

## 2018-06-13 DIAGNOSIS — I10 Essential (primary) hypertension: Secondary | ICD-10-CM | POA: Diagnosis not present

## 2018-06-22 DIAGNOSIS — F322 Major depressive disorder, single episode, severe without psychotic features: Secondary | ICD-10-CM | POA: Diagnosis not present

## 2018-06-22 DIAGNOSIS — E039 Hypothyroidism, unspecified: Secondary | ICD-10-CM | POA: Diagnosis not present

## 2018-06-22 DIAGNOSIS — I10 Essential (primary) hypertension: Secondary | ICD-10-CM | POA: Diagnosis not present

## 2018-06-22 DIAGNOSIS — F325 Major depressive disorder, single episode, in full remission: Secondary | ICD-10-CM | POA: Diagnosis not present

## 2018-06-22 DIAGNOSIS — I129 Hypertensive chronic kidney disease with stage 1 through stage 4 chronic kidney disease, or unspecified chronic kidney disease: Secondary | ICD-10-CM | POA: Diagnosis not present

## 2018-06-22 DIAGNOSIS — N183 Chronic kidney disease, stage 3 (moderate): Secondary | ICD-10-CM | POA: Diagnosis not present

## 2018-06-22 DIAGNOSIS — M810 Age-related osteoporosis without current pathological fracture: Secondary | ICD-10-CM | POA: Diagnosis not present

## 2018-06-23 ENCOUNTER — Other Ambulatory Visit: Payer: Self-pay | Admitting: Gastroenterology

## 2018-07-03 ENCOUNTER — Other Ambulatory Visit: Payer: Self-pay | Admitting: Obstetrics and Gynecology

## 2018-07-03 ENCOUNTER — Telehealth: Payer: Self-pay

## 2018-07-03 DIAGNOSIS — Z1231 Encounter for screening mammogram for malignant neoplasm of breast: Secondary | ICD-10-CM

## 2018-07-03 NOTE — Telephone Encounter (Signed)
Unable to get in contact with the patient to convert their office visit with [facility] on 07/10/2018 into a doxy.me visit. Voicemail hasn't been setup. I will attempt to call back later.   If patient calls back please convert their office visit into a doxy.me visit.

## 2018-07-03 NOTE — Telephone Encounter (Signed)
Pt returned call and stated that she will try to get her son or grandson to help set her up. If they are not able to help her she will call back to r/s due to her not having the technology to do the Virtual Visit.

## 2018-07-05 NOTE — Telephone Encounter (Signed)
Pt called back and will not be able to do the Virtual Visit. Pt was r/s for an In Office. No one was available to confirm if the date and time for the In Office was a good date so pt is aware that if its not possible to do it on that date and time we will cal her to r/s again.

## 2018-07-05 NOTE — Telephone Encounter (Signed)
LMVM for pt checking to see if go her family member to help with doxy.me visit for Monday. If not will r/s.

## 2018-07-10 ENCOUNTER — Ambulatory Visit: Payer: Medicare Other | Admitting: Adult Health

## 2018-07-10 NOTE — Telephone Encounter (Signed)
Pt is covid risk 4, has chronic pulm disease, hypertenstion.  Dx essential tremor, using diamox.

## 2018-07-11 ENCOUNTER — Ambulatory Visit (INDEPENDENT_AMBULATORY_CARE_PROVIDER_SITE_OTHER): Payer: Medicare Other | Admitting: Adult Health

## 2018-07-11 ENCOUNTER — Other Ambulatory Visit: Payer: Self-pay

## 2018-07-11 ENCOUNTER — Encounter: Payer: Self-pay | Admitting: Adult Health

## 2018-07-11 DIAGNOSIS — F329 Major depressive disorder, single episode, unspecified: Secondary | ICD-10-CM

## 2018-07-11 DIAGNOSIS — R251 Tremor, unspecified: Secondary | ICD-10-CM

## 2018-07-11 DIAGNOSIS — F32A Depression, unspecified: Secondary | ICD-10-CM

## 2018-07-11 MED ORDER — ACETAZOLAMIDE 125 MG PO TABS: 125.0000 mg | ORAL_TABLET | Freq: Every day | ORAL | 3 refills | Status: DC

## 2018-07-11 NOTE — Telephone Encounter (Signed)
Noted  

## 2018-07-11 NOTE — Progress Notes (Signed)
I have read the note, and I agree with the clinical assessment and plan.  Shadai Mcclane K Laurens Matheny   

## 2018-07-11 NOTE — Telephone Encounter (Signed)
We can do telephone if she prefers not to come in

## 2018-07-11 NOTE — Progress Notes (Signed)
  [facility] [address] [place]. [place] [address] [phone]     Virtual Visit via Telephone Note  I connected with [name] on 07/11/18 at  3:00 PM EDT by telephone located remotely at [facility] and verified that I am speaking with the correct person using two identifiers who reports being located at her home.    I discussed the limitations, risks, security and privacy concerns of performing an evaluation and management service by telephone and the availability of in person appointments. I also discussed with the patient that there may be a patient responsible charge related to this service. The patient expressed understanding and agreed to proceed. Scheduler also got consent: See telephone note for consent and additional scheduling information.    History of Present Illness:  [name] is a 78 y.o. female who has been followed in this office for tremor. She was initially scheduled for face-to-face office follow up visit today time but due to [place], visit rescheduled for non-face-to-face telephone visit with patients consent. Unable to participate in video visit due to lack of access to device with camera.    Today 07/11/18:  [name]  78 year old female with a history of essential tremor.  She was unable to do a Virtual visit so we are completed a telephone visit.  Her tremor has remained stable.  She continues on Diamox.  She reports that this continues to work well for her tremor.  She continues to struggle with depression.  She continues to see Dr. Robina Ade.  She is very tearful on the phone today.  Spoke mainly about her toxic environment and her husband and grandson running up her credit. States that she thinks about suicide daily but no plans to carry this out.   HISTORY 07/24/17:  [name] is a 78 year old female with a history of essential tremor.  She returns today for follow-up.  She remains on Diamox 125 mg  daily.  She reports that this controls her tremor.  She reports that she continues to have trouble with depression.  She states that she continues to live in a toxic environment.  She actually has not followed up with Dr. Robina Ade recently nor has she is seeing a psychologist as recommended by Dr. Robina Ade.  She returns today for evaluation   Observations/Objective:    Neurological examination  Mentation: Alert oriented to time, place, history taking. Speech and language fluent  Assessment and Plan:  1: Tremor  She will continue on Diamox. Encouraged her to follow up with Dr. Robina Ade. Advised that if she is considering Suicide to call 911. She assured me that she does not have plans to do that. Advised that if her symptoms worsen or she develops new symptoms she should let us know.   Follow Up Instructions:   F/U 6 months    I discussed the assessment and treatment plan with the patient.  The patient was provided an opportunity to ask questions and all were answered to their satisfaction. The patient agreed with the plan and verbalized an understanding of the instructions.   I provided15 minutes of non-face-to-face time during this encounter.       Ward Givens, MSN, NP-C 07/11/2018, 2:49 PM [facility] [address], [place],  [address] [phone]

## 2018-07-11 NOTE — Telephone Encounter (Signed)
Pt called back and accepted and consented to a Telephone Visit.

## 2018-07-11 NOTE — Telephone Encounter (Signed)
Attempted to call pt home VM not set up, mobile, not taking calls at this time.

## 2018-07-26 ENCOUNTER — Ambulatory Visit: Payer: Medicare Other | Admitting: Adult Health

## 2018-07-31 DIAGNOSIS — M542 Cervicalgia: Secondary | ICD-10-CM | POA: Diagnosis not present

## 2018-07-31 DIAGNOSIS — M545 Low back pain: Secondary | ICD-10-CM | POA: Diagnosis not present

## 2018-07-31 DIAGNOSIS — R197 Diarrhea, unspecified: Secondary | ICD-10-CM | POA: Diagnosis not present

## 2018-07-31 DIAGNOSIS — I10 Essential (primary) hypertension: Secondary | ICD-10-CM | POA: Diagnosis not present

## 2018-07-31 DIAGNOSIS — R600 Localized edema: Secondary | ICD-10-CM | POA: Diagnosis not present

## 2018-07-31 DIAGNOSIS — R0600 Dyspnea, unspecified: Secondary | ICD-10-CM | POA: Diagnosis not present

## 2018-07-31 DIAGNOSIS — R413 Other amnesia: Secondary | ICD-10-CM | POA: Diagnosis not present

## 2018-08-21 ENCOUNTER — Ambulatory Visit: Payer: Medicare Other

## 2018-08-21 DIAGNOSIS — K529 Noninfective gastroenteritis and colitis, unspecified: Secondary | ICD-10-CM | POA: Diagnosis not present

## 2018-08-21 DIAGNOSIS — R5383 Other fatigue: Secondary | ICD-10-CM | POA: Diagnosis not present

## 2018-08-21 DIAGNOSIS — H1131 Conjunctival hemorrhage, right eye: Secondary | ICD-10-CM | POA: Diagnosis not present

## 2018-08-23 ENCOUNTER — Telehealth: Payer: Self-pay | Admitting: Gastroenterology

## 2018-08-23 NOTE — Telephone Encounter (Signed)
Pt states that she went to the urgent care yesterday because she was having stomach pain and diarrhea, she was prescribed Bentyl 10mg , provider that she saw there thinks that she may have gastroenteritis. Pt has not taken it until Dr. Silverio Decamp agrees on dx and treatment. Pls call her.

## 2018-08-24 NOTE — Telephone Encounter (Signed)
No answer. No voicemail. 

## 2018-08-24 NOTE — Telephone Encounter (Signed)
Patient return call. ?

## 2018-08-28 NOTE — Telephone Encounter (Signed)
Patient on recent course of Doxycycline for a tick bite. She developed "gurling on my left side and diarrhea." Sen at an urgent care for an unrelated matter and mentioned the GI issues also. The urgent care provider gave her Bentyl which she did not take due to her concern about the side effects.                    She has continued to take her Linzess the entire time. Afebrile. No bloody or black stools. She agrees to hold the [place] for 2 days. She will call back on Thursday. She understands not to take any anti-diarrheals until we talk on Thursday.

## 2018-08-28 NOTE — Telephone Encounter (Signed)
Pt calling back regarding this. [phone]

## 2018-08-28 NOTE — Telephone Encounter (Signed)
Okay if persistent diarrhea, will need to check for C. difficile.  Thanks

## 2018-08-30 ENCOUNTER — Telehealth: Payer: Self-pay | Admitting: Gastroenterology

## 2018-08-30 NOTE — Telephone Encounter (Signed)
Pt called to update on Linzess

## 2018-08-30 NOTE — Telephone Encounter (Signed)
Diarrhea is gone. She has resumed Linzess as of today. No bowel movement today. She does ask about options for her bowel regimen. She has felt a little nauseous  And bloated since taking it. She wonders if she should take it every other day, try Amitiza again or lower the Linzess dosage. She would like to try tweaking her regimen if you think it could help her nausea.

## 2018-08-31 NOTE — Telephone Encounter (Signed)
Please send her prescription for Linzess 145 mcg daily.  Schedule virtual office visit next available to help adjust her medication regimen for management of IBS with constipation and diarrhea

## 2018-09-03 ENCOUNTER — Other Ambulatory Visit: Payer: Self-pay

## 2018-09-03 MED ORDER — LINACLOTIDE 145 MCG PO CAPS: 145.0000 ug | ORAL_CAPSULE | Freq: Every day | ORAL | 3 refills | Status: DC

## 2018-09-11 DIAGNOSIS — F322 Major depressive disorder, single episode, severe without psychotic features: Secondary | ICD-10-CM | POA: Diagnosis not present

## 2018-09-11 DIAGNOSIS — R413 Other amnesia: Secondary | ICD-10-CM | POA: Diagnosis not present

## 2018-09-11 DIAGNOSIS — I10 Essential (primary) hypertension: Secondary | ICD-10-CM | POA: Diagnosis not present

## 2018-09-24 ENCOUNTER — Other Ambulatory Visit: Payer: Medicare Other

## 2018-09-25 ENCOUNTER — Other Ambulatory Visit: Payer: Self-pay | Admitting: Obstetrics and Gynecology

## 2018-09-25 DIAGNOSIS — N644 Mastodynia: Secondary | ICD-10-CM

## 2018-10-01 ENCOUNTER — Other Ambulatory Visit: Payer: Self-pay

## 2018-10-01 ENCOUNTER — Ambulatory Visit: Payer: Medicare Other

## 2018-10-01 ENCOUNTER — Ambulatory Visit
Admission: RE | Admit: 2018-10-01 | Discharge: 2018-10-01 | Disposition: A | Discharge status: Home / Self Care | Payer: Medicare Other | Source: Ambulatory Visit | Attending: Obstetrics and Gynecology | Admitting: Obstetrics and Gynecology

## 2018-10-01 DIAGNOSIS — N390 Urinary tract infection, site not specified: Secondary | ICD-10-CM | POA: Diagnosis not present

## 2018-10-01 DIAGNOSIS — R922 Inconclusive mammogram: Secondary | ICD-10-CM | POA: Diagnosis not present

## 2018-10-01 DIAGNOSIS — N644 Mastodynia: Secondary | ICD-10-CM

## 2018-10-02 ENCOUNTER — Ambulatory Visit: Payer: Medicare Other

## 2018-10-12 ENCOUNTER — Ambulatory Visit: Payer: Medicare Other | Admitting: Gastroenterology

## 2018-10-30 DIAGNOSIS — R0602 Shortness of breath: Secondary | ICD-10-CM | POA: Diagnosis not present

## 2018-11-11 DIAGNOSIS — L0291 Cutaneous abscess, unspecified: Secondary | ICD-10-CM | POA: Diagnosis not present

## 2018-11-17 ENCOUNTER — Other Ambulatory Visit: Payer: Self-pay | Admitting: Gastroenterology

## 2018-11-28 DIAGNOSIS — I10 Essential (primary) hypertension: Secondary | ICD-10-CM | POA: Diagnosis not present

## 2018-11-28 DIAGNOSIS — F325 Major depressive disorder, single episode, in full remission: Secondary | ICD-10-CM | POA: Diagnosis not present

## 2018-11-28 DIAGNOSIS — F322 Major depressive disorder, single episode, severe without psychotic features: Secondary | ICD-10-CM | POA: Diagnosis not present

## 2018-11-28 DIAGNOSIS — E039 Hypothyroidism, unspecified: Secondary | ICD-10-CM | POA: Diagnosis not present

## 2018-11-28 DIAGNOSIS — E7800 Pure hypercholesterolemia, unspecified: Secondary | ICD-10-CM | POA: Diagnosis not present

## 2018-12-03 ENCOUNTER — Other Ambulatory Visit: Payer: Self-pay | Admitting: Geriatric Medicine

## 2018-12-03 DIAGNOSIS — M545 Low back pain, unspecified: Secondary | ICD-10-CM

## 2018-12-03 DIAGNOSIS — Z23 Encounter for immunization: Secondary | ICD-10-CM | POA: Diagnosis not present

## 2018-12-03 DIAGNOSIS — G8929 Other chronic pain: Secondary | ICD-10-CM | POA: Diagnosis not present

## 2018-12-03 DIAGNOSIS — H9312 Tinnitus, left ear: Secondary | ICD-10-CM | POA: Diagnosis not present

## 2018-12-03 DIAGNOSIS — R410 Disorientation, unspecified: Secondary | ICD-10-CM | POA: Diagnosis not present

## 2018-12-07 ENCOUNTER — Ambulatory Visit (HOSPITAL_COMMUNITY)
Admission: RE | Admit: 2018-12-07 | Discharge: 2018-12-07 | Disposition: A | Discharge status: Home / Self Care | Payer: Medicare Other | Source: Ambulatory Visit | Attending: Geriatric Medicine | Admitting: Geriatric Medicine

## 2018-12-07 ENCOUNTER — Other Ambulatory Visit: Payer: Self-pay

## 2018-12-07 DIAGNOSIS — M545 Low back pain, unspecified: Secondary | ICD-10-CM

## 2018-12-11 DIAGNOSIS — I10 Essential (primary) hypertension: Secondary | ICD-10-CM | POA: Diagnosis not present

## 2018-12-11 DIAGNOSIS — M4317 Spondylolisthesis, lumbosacral region: Secondary | ICD-10-CM | POA: Diagnosis not present

## 2018-12-11 DIAGNOSIS — Z6825 Body mass index (BMI) 25.0-25.9, adult: Secondary | ICD-10-CM | POA: Diagnosis not present

## 2018-12-11 DIAGNOSIS — M47816 Spondylosis without myelopathy or radiculopathy, lumbar region: Secondary | ICD-10-CM | POA: Diagnosis not present

## 2018-12-11 DIAGNOSIS — M545 Low back pain: Secondary | ICD-10-CM | POA: Diagnosis not present

## 2018-12-11 DIAGNOSIS — M4316 Spondylolisthesis, lumbar region: Secondary | ICD-10-CM | POA: Diagnosis not present

## 2018-12-20 ENCOUNTER — Other Ambulatory Visit: Payer: Self-pay

## 2018-12-20 ENCOUNTER — Ambulatory Visit (INDEPENDENT_AMBULATORY_CARE_PROVIDER_SITE_OTHER): Payer: Medicare Other | Admitting: Adult Health

## 2018-12-20 ENCOUNTER — Encounter: Payer: Self-pay | Admitting: Adult Health

## 2018-12-20 VITALS — BP 124/77 | HR 77 | Temp 97.1°F | Ht 64.0 in | Wt 149.0 lb

## 2018-12-20 DIAGNOSIS — R251 Tremor, unspecified: Secondary | ICD-10-CM

## 2018-12-20 DIAGNOSIS — H9311 Tinnitus, right ear: Secondary | ICD-10-CM | POA: Diagnosis not present

## 2018-12-20 DIAGNOSIS — R410 Disorientation, unspecified: Secondary | ICD-10-CM

## 2018-12-20 NOTE — Patient Instructions (Signed)
Your Plan:  Continue Diamox MRI Brain  If your symptoms worsen or you develop new symptoms please let us know.    Thank you for coming to see [place] at [facility]. I hope we have been able to provide you high quality care today.  You may receive a patient satisfaction survey over the next few weeks. We would appreciate your feedback and comments so that we may continue to improve ourselves and the health of our patients.

## 2018-12-20 NOTE — Progress Notes (Addendum)
PATIENT: [name] DOB: 11-17-1940  REASON FOR VISIT: follow up HISTORY FROM: patient  HISTORY OF PRESENT ILLNESS: Today 12/20/18:   [name] is a 78 year old female with a history of essential tremor.  She returns today for follow-up.  She remains on Diamox.  She finds it beneficial for her tremor.  She states that recently she had an episode of ringing in the right ear.  She states for 30 minutes she could barely hear anything out of that ear.  Eventually it went away.  She states that there was another time that she was putting her pills in a pillbox and got confused.  She states that it was as if something going on in her brain.  She states that she was able to sit down for 30 minutes and she felt better.  The patient is extremely anxious that something is going on in her brain.  She is requesting to have another MRI.  She returns today for an evaluation.  HISTORY 07/11/18:  [name]  78 year old female with a history of essential tremor.  She was unable to do a Virtual visit so we are completed a telephone visit.  Her tremor has remained stable.  She continues on Diamox.  She reports that this continues to work well for her tremor.  She continues to struggle with depression.  She continues to see Dr. Robina Ade.  She is very tearful on the phone today.  Spoke mainly about her toxic environment and her husband and grandson running up her credit. States that she thinks about suicide daily but no plans to carry this out.    REVIEW OF SYSTEMS: Out of a complete 14 system review of symptoms, the patient complains only of the following symptoms, and all other reviewed systems are negative.  See HPI  ALLERGIES: Allergies  Allergen Reactions  . Crestor [Rosuvastatin Calcium]     myalgia  . Zocor [Simvastatin]     myalgia  . Enalapril     swelling  . Lamictal [Lamotrigine]     rash  . Nitrofuran Derivatives Nausea Only  . Erythromycin Other (See Comments)    Stomach  burning  . Methylprednisolone     Major depression, weakness, nausea  . Nystatin Other (See Comments)    Blisters on her lips  . Vivelle [Estradiol]     rash  . Zetia [Ezetimibe]     Muscle weakness  . Ciprofloxacin Nausea Only  . Ciprofloxacin Hcl     rash  . Latex Rash  . Shellfish Allergy Rash  . Sulfa Antibiotics Nausea Only  . Sulfasalazine Nausea Only    HOME MEDICATIONS: Outpatient Medications Prior to Visit  Medication Sig Dispense Refill  . acetaZOLAMIDE (DIAMOX) 125 MG tablet Take 1 tablet (125 mg total) by mouth daily. 90 tablet 3  . aspirin EC 81 MG tablet Take 81 mg by mouth daily.    . cephALEXin (KEFLEX) 500 MG capsule Take 500 mg by mouth 2 (two) times daily.    . DULoxetine (CYMBALTA) 30 MG capsule Take 30 mg by mouth daily.    . ergocalciferol (VITAMIN D2) 50000 UNITS capsule Take 50,000 Units by mouth once a week.    . famotidine (PEPCID) 20 MG tablet TAKE 1 TABLET 3 (THREE) TIMES DAILY (BEFORE BREAKFAST AND DINNER AND ANOTHER ONE BEFORE BEDTIME) 90 tablet 5  . fluconazole (DIFLUCAN) 100 MG tablet Take 100mg  daily for 3 days 3 tablet 0  . HYDROcodone-acetaminophen (NORCO) 7.5-325 MG per tablet Take  1 tablet by mouth 2 (two) times daily. For pain    . hydrocortisone 2.5 % cream daily as needed.    . hyoscyamine (LEVSIN SL) 0.125 MG SL tablet DISSOLVE 1 TABLET UNDER THE TONGUE EVERY 6 HOURS AS NEEDED 60 tablet 0  . levothyroxine (SYNTHROID, LEVOTHROID) 50 MCG tablet Once daily    . linaclotide (LINZESS) 145 MCG CAPS capsule Take 1 capsule (145 mcg total) by mouth daily before breakfast. 30 capsule 3  . LORazepam (ATIVAN) 1 MG tablet Take 1 tablet by mouth at bedtime.    Marland Kitchen losartan (COZAAR) 100 MG tablet Take 100 mg by mouth daily.    . pregabalin (LYRICA) 50 MG capsule Take 50 mg by mouth daily.     . ranitidine (ZANTAC) 150 MG tablet TAKE 1 TO 2 TABLETS BY MOUTH TWICE A DAY 120 tablet 3  . SUMAtriptan (IMITREX) 50 MG tablet Take 50 mg by mouth every 2 (two)  hours as needed. For migraine    . VENTOLIN HFA 108 (90 Base) MCG/ACT inhaler TAKE 2 PUFFS BY MOUTH EVERY 6 HOURS AS NEEDED FOR WHEEZE OR SHORTNESS OF BREATH 18 g 4  . fluticasone furoate-vilanterol (BREO ELLIPTA) 100-25 MCG/INH AEPB INHALE 1 PUFF BY MOUTH EVERY DAY (Patient not taking: Reported on 12/20/2018) 60 each 2   No facility-administered medications prior to visit.     PAST MEDICAL HISTORY: Past Medical History:  Diagnosis Date  . ADD (attention deficit disorder)   . Anxiety   . Arthritis   . Asthma   . Chronic kidney disease    CKD Stage 3  . Chronic renal insufficiency, stage III (moderate)   . Depression    major depression  . Dermatitis   . Diverticulosis   . Essential tremor 05/17/2012  . Fibromyalgia   . GERD (gastroesophageal reflux disease)   . Hyperlipidemia   . Hypertension   . Memory loss 05/17/2012  . Migraines   . Occasional tremors   . Pneumonia   . TIA (transient ischemic attack)    History of 2 TIA's per pt    PAST SURGICAL HISTORY: Past Surgical History:  Procedure Laterality Date  . ABDOMINAL HYSTERECTOMY    . APPENDECTOMY    . CATARACT EXTRACTION Bilateral   . TONSILLECTOMY AND ADENOIDECTOMY     age 56  . TOTAL HIP ARTHROPLASTY Bilateral 2009   Dr Adriana Mccallum    FAMILY HISTORY: Family History  Problem Relation Age of Onset  . Liver cancer Mother   . Pulmonary embolism Mother   . Prostate cancer Father   . Stroke Maternal Grandmother   . Heart failure Maternal Grandfather   . Emphysema Maternal Grandfather   . Congestive Heart Failure Maternal Grandfather   . Coronary artery disease Paternal Grandmother   . Coronary artery disease Paternal Grandfather   . Prostate cancer Paternal Uncle        x 2  . ALS Paternal Aunt   . Brain cancer Paternal Aunt   . Breast cancer Paternal Aunt   . Lung cancer Maternal Aunt   . Depression Brother        suicide  . Diabetes Paternal Aunt        x 2  . Breast cancer Cousin   . Colon cancer Neg  Hx   . Esophageal cancer Neg Hx   . Rectal cancer Neg Hx   . Stomach cancer Neg Hx     SOCIAL HISTORY: Social History   Socioeconomic History  . Marital  status: Married    Spouse name: Not on file  . Number of children: 2  . Years of education: hs  . Highest education level: Not on file  Occupational History  . Occupation: Retired    Fish farm manager: RETIRED  Social Needs  . Financial resource strain: Not on file  . Food insecurity    Worry: Not on file    Inability: Not on file  . Transportation needs    Medical: Not on file    Non-medical: Not on file  Tobacco Use  . Smoking status: Never Smoker  . Smokeless tobacco: Never Used  Substance and Sexual Activity  . Alcohol use: No    Alcohol/week: 0.0 standard drinks  . Drug use: No  . Sexual activity: Never  Lifestyle  . Physical activity    Days per week: Not on file    Minutes per session: Not on file  . Stress: Not on file  Relationships  . Social Herbalist on phone: Not on file    Gets together: Not on file    Attends religious service: Not on file    Active member of club or organization: Not on file    Attends meetings of clubs or organizations: Not on file    Relationship status: Not on file  . Intimate partner violence    Fear of current or ex partner: Not on file    Emotionally abused: Not on file    Physically abused: Not on file    Forced sexual activity: Not on file  Other Topics Concern  . Not on file  Social History Narrative   Patient lives at home with her husband [name]).    Retired.   Education high school.   Right handed.   Caffeine two cups of coffee daily and one glass of sweet tea.      PHYSICAL EXAM  Vitals:   12/20/18 1412  BP: 124/77  Pulse: 77  Temp: (!) 97.1 F (36.2 C)  Weight: 149 lb (67.6 kg)  Height: 5\' 4"  (1.626 m)   Body mass index is 25.58 kg/m.  Generalized: Well developed, in no acute distress   Neurological examination  Mentation: Alert oriented  to time, place, history taking. Follows all commands speech and language fluent Cranial nerve II-XII: Pupils were equal round reactive to light. Extraocular movements were full, visual field were full on confrontational test. Head turning and shoulder shrug  were normal and symmetric. Motor: The motor testing reveals 5 over 5 strength of all 4 extremities. Good symmetric motor tone is noted throughout.  Sensory: Sensory testing is intact to soft touch on all 4 extremities. No evidence of extinction is noted.  Coordination: Cerebellar testing reveals good finger-nose-finger and heel-to-shin bilaterally.  Gait and station: Gait is normal.  Reflexes: Deep tendon reflexes are symmetric and normal bilaterally.   DIAGNOSTIC DATA (LABS, IMAGING, TESTING) - I reviewed patient records, labs, notes, testing and imaging myself where available.  Lab Results  Component Value Date   WBC 7.2 03/03/2012   HGB 14.1 03/03/2012   HCT 40.2 03/03/2012   MCV 92.8 03/03/2012   PLT 255 03/03/2012      Component Value Date/Time   NA 138 12/29/2014 1519   K 4.5 12/29/2014 1519   CL 100 12/29/2014 1519   CO2 26 12/29/2014 1519   GLUCOSE 78 12/29/2014 1519   GLUCOSE 87 05/29/2012 1405   BUN 17 12/29/2014 1519   CREATININE 0.98 12/29/2014 1519   CALCIUM  10.1 12/29/2014 1519   PROT 6.9 12/29/2014 1519   ALBUMIN 4.2 12/29/2014 1519   AST 18 12/29/2014 1519   ALT 12 12/29/2014 1519   ALKPHOS 80 12/29/2014 1519   BILITOT 0.5 12/29/2014 1519   GFRNONAA 58 (L) 12/29/2014 1519   GFRAA 67 12/29/2014 1519   Lab Results  Component Value Date   CHOL 254 (H) 03/04/2012   HDL 30 (L) 03/04/2012   LDLCALC 158 (H) 03/04/2012   TRIG 328 (H) 03/04/2012   CHOLHDL 8.5 03/04/2012   No results found for: HGBA1C No results found for: VITAMINB12 No results found for: TSH    ASSESSMENT AND PLAN 78 y.o. year old female  has a past medical history of ADD (attention deficit disorder), Anxiety, Arthritis, Asthma,  Chronic kidney disease, Chronic renal insufficiency, stage III (moderate), Depression, Dermatitis, Diverticulosis, Essential tremor (05/17/2012), Fibromyalgia, GERD (gastroesophageal reflux disease), Hyperlipidemia, Hypertension, Memory loss (05/17/2012), Migraines, Occasional tremors, Pneumonia, and TIA (transient ischemic attack). here with:  1.  Tremor 2.  Confusional event 3.  Tinnitus right side  The patient will continue on Diamox.  The patient is adamant that something is going on in her brain.  She is concerned about the confusional event and tinnitus that she recently experienced.  Her physical exam is relatively unremarkable.  She has had an abnormal MRI of the brain in the past.  I will repeat an MRI of the brain to look for any other changes.  She is advised that if her symptoms worsen or she develops new symptoms she should let us know.  She will follow-up in 1 year or sooner if needed.    Ward Givens, MSN, NP-C 12/20/2018, 2:44 PM [facility] [address] [place], [place] [address] [phone]

## 2018-12-21 DIAGNOSIS — R300 Dysuria: Secondary | ICD-10-CM | POA: Diagnosis not present

## 2018-12-22 NOTE — Progress Notes (Signed)
I have read the note, and I agree with the clinical assessment and plan.  Haleigh Desmith K Nyal Schachter   

## 2018-12-24 ENCOUNTER — Telehealth: Payer: Self-pay | Admitting: Adult Health

## 2018-12-24 NOTE — Telephone Encounter (Signed)
Medicare/bankers fidelity order sent to GI. No auth they will reach out to the patient to schedule.

## 2018-12-27 DIAGNOSIS — R300 Dysuria: Secondary | ICD-10-CM | POA: Diagnosis not present

## 2018-12-29 DIAGNOSIS — M160 Bilateral primary osteoarthritis of hip: Secondary | ICD-10-CM | POA: Diagnosis not present

## 2018-12-29 DIAGNOSIS — M25552 Pain in left hip: Secondary | ICD-10-CM | POA: Diagnosis not present

## 2018-12-29 DIAGNOSIS — Z96642 Presence of left artificial hip joint: Secondary | ICD-10-CM | POA: Diagnosis not present

## 2018-12-29 DIAGNOSIS — M25551 Pain in right hip: Secondary | ICD-10-CM | POA: Diagnosis not present

## 2018-12-29 DIAGNOSIS — Z471 Aftercare following joint replacement surgery: Secondary | ICD-10-CM | POA: Diagnosis not present

## 2018-12-29 DIAGNOSIS — Z96641 Presence of right artificial hip joint: Secondary | ICD-10-CM | POA: Diagnosis not present

## 2019-01-02 DIAGNOSIS — M5136 Other intervertebral disc degeneration, lumbar region: Secondary | ICD-10-CM | POA: Diagnosis not present

## 2019-01-02 DIAGNOSIS — Z96643 Presence of artificial hip joint, bilateral: Secondary | ICD-10-CM | POA: Diagnosis not present

## 2019-01-19 ENCOUNTER — Other Ambulatory Visit: Payer: Medicare Other

## 2019-01-22 ENCOUNTER — Ambulatory Visit: Payer: PRIVATE HEALTH INSURANCE | Admitting: Adult Health

## 2019-01-29 DIAGNOSIS — E039 Hypothyroidism, unspecified: Secondary | ICD-10-CM | POA: Diagnosis not present

## 2019-01-29 DIAGNOSIS — F322 Major depressive disorder, single episode, severe without psychotic features: Secondary | ICD-10-CM | POA: Diagnosis not present

## 2019-01-29 DIAGNOSIS — I10 Essential (primary) hypertension: Secondary | ICD-10-CM | POA: Diagnosis not present

## 2019-01-29 DIAGNOSIS — E7800 Pure hypercholesterolemia, unspecified: Secondary | ICD-10-CM | POA: Diagnosis not present

## 2019-01-29 DIAGNOSIS — F325 Major depressive disorder, single episode, in full remission: Secondary | ICD-10-CM | POA: Diagnosis not present

## 2019-02-06 ENCOUNTER — Other Ambulatory Visit: Payer: Medicare Other

## 2019-02-12 ENCOUNTER — Other Ambulatory Visit: Payer: Self-pay | Admitting: Gastroenterology

## 2019-02-21 ENCOUNTER — Other Ambulatory Visit: Payer: Self-pay | Admitting: Gastroenterology

## 2019-02-25 ENCOUNTER — Other Ambulatory Visit: Payer: Self-pay | Admitting: Gastroenterology

## 2019-02-26 DIAGNOSIS — E7800 Pure hypercholesterolemia, unspecified: Secondary | ICD-10-CM | POA: Diagnosis not present

## 2019-02-26 DIAGNOSIS — E039 Hypothyroidism, unspecified: Secondary | ICD-10-CM | POA: Diagnosis not present

## 2019-02-26 DIAGNOSIS — F322 Major depressive disorder, single episode, severe without psychotic features: Secondary | ICD-10-CM | POA: Diagnosis not present

## 2019-02-26 DIAGNOSIS — F325 Major depressive disorder, single episode, in full remission: Secondary | ICD-10-CM | POA: Diagnosis not present

## 2019-02-26 DIAGNOSIS — I10 Essential (primary) hypertension: Secondary | ICD-10-CM | POA: Diagnosis not present

## 2019-03-03 ENCOUNTER — Other Ambulatory Visit: Payer: Self-pay | Admitting: Gastroenterology

## 2019-03-14 ENCOUNTER — Telehealth: Payer: Self-pay | Admitting: Adult Health

## 2019-03-27 DIAGNOSIS — R0602 Shortness of breath: Secondary | ICD-10-CM | POA: Diagnosis not present

## 2019-03-27 DIAGNOSIS — M549 Dorsalgia, unspecified: Secondary | ICD-10-CM | POA: Diagnosis not present

## 2019-03-28 DIAGNOSIS — I10 Essential (primary) hypertension: Secondary | ICD-10-CM | POA: Diagnosis not present

## 2019-03-28 DIAGNOSIS — R0602 Shortness of breath: Secondary | ICD-10-CM | POA: Diagnosis not present

## 2019-03-28 DIAGNOSIS — E039 Hypothyroidism, unspecified: Secondary | ICD-10-CM | POA: Diagnosis not present

## 2019-03-28 DIAGNOSIS — F325 Major depressive disorder, single episode, in full remission: Secondary | ICD-10-CM | POA: Diagnosis not present

## 2019-03-28 DIAGNOSIS — F322 Major depressive disorder, single episode, severe without psychotic features: Secondary | ICD-10-CM | POA: Diagnosis not present

## 2019-03-28 DIAGNOSIS — E7800 Pure hypercholesterolemia, unspecified: Secondary | ICD-10-CM | POA: Diagnosis not present

## 2019-04-11 DIAGNOSIS — H02831 Dermatochalasis of right upper eyelid: Secondary | ICD-10-CM | POA: Diagnosis not present

## 2019-04-11 DIAGNOSIS — Z961 Presence of intraocular lens: Secondary | ICD-10-CM | POA: Diagnosis not present

## 2019-04-11 DIAGNOSIS — H02834 Dermatochalasis of left upper eyelid: Secondary | ICD-10-CM | POA: Diagnosis not present

## 2019-04-24 ENCOUNTER — Other Ambulatory Visit: Payer: Self-pay | Admitting: Gastroenterology

## 2019-05-01 DIAGNOSIS — L720 Epidermal cyst: Secondary | ICD-10-CM | POA: Diagnosis not present

## 2019-05-01 DIAGNOSIS — L309 Dermatitis, unspecified: Secondary | ICD-10-CM | POA: Diagnosis not present

## 2019-05-02 DIAGNOSIS — E7800 Pure hypercholesterolemia, unspecified: Secondary | ICD-10-CM | POA: Diagnosis not present

## 2019-05-02 DIAGNOSIS — I10 Essential (primary) hypertension: Secondary | ICD-10-CM | POA: Diagnosis not present

## 2019-05-02 DIAGNOSIS — F322 Major depressive disorder, single episode, severe without psychotic features: Secondary | ICD-10-CM | POA: Diagnosis not present

## 2019-05-02 DIAGNOSIS — F325 Major depressive disorder, single episode, in full remission: Secondary | ICD-10-CM | POA: Diagnosis not present

## 2019-05-02 DIAGNOSIS — E039 Hypothyroidism, unspecified: Secondary | ICD-10-CM | POA: Diagnosis not present

## 2019-05-16 ENCOUNTER — Other Ambulatory Visit: Payer: Self-pay | Admitting: Pulmonary Disease

## 2019-05-27 DIAGNOSIS — R350 Frequency of micturition: Secondary | ICD-10-CM | POA: Diagnosis not present

## 2019-05-27 DIAGNOSIS — R300 Dysuria: Secondary | ICD-10-CM | POA: Diagnosis not present

## 2019-06-03 ENCOUNTER — Other Ambulatory Visit: Payer: Self-pay

## 2019-06-03 ENCOUNTER — Ambulatory Visit
Admission: RE | Admit: 2019-06-03 | Discharge: 2019-06-03 | Disposition: A | Discharge status: Home / Self Care | Payer: Medicare Other | Source: Ambulatory Visit | Attending: Adult Health | Admitting: Adult Health

## 2019-06-03 DIAGNOSIS — H9311 Tinnitus, right ear: Secondary | ICD-10-CM | POA: Diagnosis not present

## 2019-06-03 DIAGNOSIS — R410 Disorientation, unspecified: Secondary | ICD-10-CM

## 2019-06-03 MED ORDER — GADOBENATE DIMEGLUMINE 529 MG/ML IV SOLN
14.0000 mL | Freq: Once | INTRAVENOUS | Status: AC | PRN
  Administered 2019-06-03: 14 mL via INTRAVENOUS

## 2019-06-05 ENCOUNTER — Telehealth: Payer: Self-pay | Admitting: *Deleted

## 2019-06-05 NOTE — Telephone Encounter (Signed)
I called pt and relayed that the MRI brain results showed no significant change from lat MRI 01-16-2015.  She wanted a copy of report.  She stated she had ministrokes (CT back in 2019) Dr. Jannifer Franklin told her.  I will forward message to him has she asked for a call from him at his convenience.

## 2019-06-06 NOTE — Telephone Encounter (Signed)
Mailed copy to pt.

## 2019-06-07 NOTE — Telephone Encounter (Signed)
Called the patient, left a message, I will try to call back later.  Patient has minimal white matter changes by MRI, unchanged from 2016.  The patient does have a history of hypertension, and migraine headache.

## 2019-06-09 NOTE — Telephone Encounter (Signed)
I have called the patient several times at both numbers listed, I left a message with her husband.  If the patient requires further assistance or desires to speak with me, she is to contact us at the number that she can be reached.

## 2019-06-10 ENCOUNTER — Telehealth: Payer: Self-pay | Admitting: Neurology

## 2019-06-10 NOTE — Telephone Encounter (Signed)
The patient did call back, I discussed the results of the MRI of the brain with her, and indicated that she does have some small vessel disease, no change from 2016.

## 2019-06-10 NOTE — Telephone Encounter (Signed)
Error

## 2019-06-18 ENCOUNTER — Ambulatory Visit: Payer: Medicare Other | Admitting: Adult Health

## 2019-06-19 ENCOUNTER — Telehealth: Payer: Self-pay | Admitting: Gastroenterology

## 2019-06-20 NOTE — Telephone Encounter (Signed)
Spoke with the patient. Last seen in office in 2019. She has been taking daily stool softeners and daily Linzess 145 mcg. She has begun having  Low abdominal discomfort. She can feel a hardness near her surgical scar from a hysterectomy. She has also noticed a bugling in the perineal area. Difficulty moving stool has been an ongoing problem. Agrees to an appointment to evaluate.

## 2019-06-21 ENCOUNTER — Ambulatory Visit: Payer: Medicare Other | Admitting: Gastroenterology

## 2019-06-21 ENCOUNTER — Telehealth: Payer: Self-pay | Admitting: Gastroenterology

## 2019-06-24 ENCOUNTER — Ambulatory Visit: Payer: Medicare Other | Admitting: Adult Health

## 2019-06-25 ENCOUNTER — Other Ambulatory Visit: Payer: Self-pay

## 2019-06-25 NOTE — Telephone Encounter (Signed)
Rescheduled her appointment and mailed her a letter.

## 2019-07-02 DIAGNOSIS — F322 Major depressive disorder, single episode, severe without psychotic features: Secondary | ICD-10-CM | POA: Diagnosis not present

## 2019-07-02 DIAGNOSIS — E039 Hypothyroidism, unspecified: Secondary | ICD-10-CM | POA: Diagnosis not present

## 2019-07-02 DIAGNOSIS — F325 Major depressive disorder, single episode, in full remission: Secondary | ICD-10-CM | POA: Diagnosis not present

## 2019-07-02 DIAGNOSIS — E7800 Pure hypercholesterolemia, unspecified: Secondary | ICD-10-CM | POA: Diagnosis not present

## 2019-07-02 DIAGNOSIS — I10 Essential (primary) hypertension: Secondary | ICD-10-CM | POA: Diagnosis not present

## 2019-07-03 ENCOUNTER — Other Ambulatory Visit: Payer: Self-pay | Admitting: Geriatric Medicine

## 2019-07-03 ENCOUNTER — Ambulatory Visit
Admission: RE | Admit: 2019-07-03 | Discharge: 2019-07-03 | Disposition: A | Discharge status: Home / Self Care | Payer: Medicare Other | Source: Ambulatory Visit | Attending: Geriatric Medicine | Admitting: Geriatric Medicine

## 2019-07-03 DIAGNOSIS — R059 Cough, unspecified: Secondary | ICD-10-CM

## 2019-07-03 DIAGNOSIS — R350 Frequency of micturition: Secondary | ICD-10-CM | POA: Diagnosis not present

## 2019-07-03 DIAGNOSIS — R5383 Other fatigue: Secondary | ICD-10-CM | POA: Diagnosis not present

## 2019-07-03 DIAGNOSIS — R634 Abnormal weight loss: Secondary | ICD-10-CM | POA: Diagnosis not present

## 2019-07-03 DIAGNOSIS — M546 Pain in thoracic spine: Secondary | ICD-10-CM | POA: Diagnosis not present

## 2019-07-03 DIAGNOSIS — K5901 Slow transit constipation: Secondary | ICD-10-CM | POA: Diagnosis not present

## 2019-07-03 DIAGNOSIS — R05 Cough: Secondary | ICD-10-CM | POA: Diagnosis not present

## 2019-07-03 DIAGNOSIS — Z79899 Other long term (current) drug therapy: Secondary | ICD-10-CM | POA: Diagnosis not present

## 2019-07-03 DIAGNOSIS — R0602 Shortness of breath: Secondary | ICD-10-CM | POA: Diagnosis not present

## 2019-07-08 ENCOUNTER — Encounter: Payer: Self-pay | Admitting: Gastroenterology

## 2019-07-08 ENCOUNTER — Ambulatory Visit (INDEPENDENT_AMBULATORY_CARE_PROVIDER_SITE_OTHER): Payer: Medicare Other | Admitting: Gastroenterology

## 2019-07-08 VITALS — BP 120/78 | HR 96 | Ht 64.0 in | Wt 140.0 lb

## 2019-07-08 DIAGNOSIS — K219 Gastro-esophageal reflux disease without esophagitis: Secondary | ICD-10-CM | POA: Diagnosis not present

## 2019-07-08 DIAGNOSIS — K5900 Constipation, unspecified: Secondary | ICD-10-CM

## 2019-07-08 DIAGNOSIS — R1013 Epigastric pain: Secondary | ICD-10-CM

## 2019-07-08 DIAGNOSIS — K582 Mixed irritable bowel syndrome: Secondary | ICD-10-CM

## 2019-07-08 MED ORDER — NALOXEGOL OXALATE 12.5 MG PO TABS: 12.5000 mg | ORAL_TABLET | Freq: Every day | ORAL | 6 refills | Status: DC

## 2019-07-08 NOTE — Progress Notes (Signed)
[name]    [name]    1940-12-02  Primary Care Physician:Stoneking, Christiane Ha, MD  Referring Physician: Lajean Manes, MD [address] [place],  [place] [address]   Chief complaint: Generalized abdominal pain, chronic IBS, chronic constipation  HPI:  79 year old female here for follow-up visit for irritable bowel syndrome, constipation and abdominal pain.  She was last seen in office June 2017.  Complains of intermittent generalized abdominal pain and taking Pepto-Bismol intermittently with minimal improvement  She is currently taking taking Pepcid for breakthrough heartburn and regurgitation.    Decreased the dose of Linzess to 145 mcg daily as she was having diarrhea with 290 mcg daily.  She is unable to completely evacuate with lower dose Linzess, she has to take multiple stool softeners and over-the-counter laxatives No melena or rectal bleeding.  No vomiting.  She has lost over 14 pounds in the past year intentionally.  She has had extensive GI work-up in the past by Dr. Cristina Gong at [facility] and here   EGD October 2017 was normal .   colonoscopy October 2017 2 small diminutive tubular adenomas were removed, sigmoid diverticulitis and small internal hemorrhoids   GI workup in the past which includes HIDA scan that was normal with ejection fraction of 80%.  Gastric emptying scan was also normal with 91% emptying in 2 hours.  upper endoscopy by Dr. Cristina Gong in July 2012 was normal.  Colonoscopy in July 2012 was also normal.  Recommended recall in 5 years Prior colonoscopy in 2003 with sessile serrated polyp that was removed with biopsy    Outpatient Encounter Medications as of 07/08/2019  Medication Sig  . acetaZOLAMIDE (DIAMOX) 125 MG tablet Take 1 tablet (125 mg total) by mouth daily.  Marland Kitchen albuterol (VENTOLIN HFA) 108 (90 Base) MCG/ACT inhaler TAKE 2 PUFFS BY MOUTH EVERY 6 HOURS AS NEEDED FOR WHEEZE OR SHORTNESS OF BREATH  . aspirin EC 81 MG  tablet Take 81 mg by mouth daily.  Marland Kitchen bismuth subsalicylate (PEPTO BISMOL) 262 MG/15ML suspension Take 30 mLs by mouth every 6 (six) hours as needed.  . docusate sodium (COLACE) 250 MG capsule Take 250 mg by mouth daily.  . DULoxetine (CYMBALTA) 30 MG capsule Take 30 mg by mouth daily.  . ergocalciferol (VITAMIN D2) 50000 UNITS capsule Take 50,000 Units by mouth once a week.  . famotidine (PEPCID) 20 MG tablet TAKE 1 TABLET 3 (THREE) TIMES DAILY (BEFORE BREAKFAST AND DINNER AND ANOTHER ONE BEFORE BEDTIME)  . HYDROcodone-acetaminophen (NORCO) 7.5-325 MG per tablet Take 1 tablet by mouth 2 (two) times daily. For pain  . hydrocortisone 2.5 % cream daily as needed.  . hyoscyamine (LEVSIN SL) 0.125 MG SL tablet DISSOLVE 1 TABLET UNDER THE TONGUE EVERY 6 HOURS AS NEEDED  . levothyroxine (SYNTHROID, LEVOTHROID) 50 MCG tablet Once daily  . LINZESS 145 MCG CAPS capsule TAKE 1 CAPSULE (145 MCG TOTAL) BY MOUTH DAILY BEFORE BREAKFAST.  Marland Kitchen LINZESS 290 MCG CAPS capsule TAKE 1 CAPSULE (290 MCG TOTAL) BY MOUTH DAILY BEFORE BREAKFAST.  Marland Kitchen LORazepam (ATIVAN) 1 MG tablet Take 1 tablet by mouth at bedtime.  Marland Kitchen losartan (COZAAR) 100 MG tablet Take 100 mg by mouth daily.  . SUMAtriptan (IMITREX) 50 MG tablet Take 50 mg by mouth every 2 (two) hours as needed. For migraine  . [DISCONTINUED] cephALEXin (KEFLEX) 500 MG capsule Take 500 mg by mouth 2 (two) times daily.  . [DISCONTINUED] fluconazole (DIFLUCAN) 100 MG  tablet Take 100mg  daily for 3 days  . [DISCONTINUED] fluticasone furoate-vilanterol (BREO ELLIPTA) 100-25 MCG/INH AEPB INHALE 1 PUFF BY MOUTH EVERY DAY (Patient not taking: Reported on 12/20/2018)  . [DISCONTINUED] pregabalin (LYRICA) 50 MG capsule Take 50 mg by mouth daily.   . [DISCONTINUED] ranitidine (ZANTAC) 150 MG tablet TAKE 1 TO 2 TABLETS BY MOUTH TWICE A DAY   No facility-administered encounter medications on file as of 07/08/2019.    Allergies as of 07/08/2019 - Review Complete 07/08/2019  Allergen  Reaction Noted  . Crestor [rosuvastatin calcium]  11/17/2015  . Zocor [simvastatin]  11/17/2015  . Enalapril  11/17/2015  . Lamictal [lamotrigine]  11/17/2015  . Nitrofuran derivatives Nausea Only 12/29/2014  . Erythromycin Other (See Comments)   . Methylprednisolone  12/20/2018  . Nystatin Other (See Comments) 11/17/2017  . Vivelle [estradiol]  11/17/2015  . Zetia [ezetimibe]  07/24/2017  . Ciprofloxacin Nausea Only 12/12/2011  . Ciprofloxacin hcl  11/17/2015  . Latex Rash 03/03/2012  . Shellfish allergy Rash 03/03/2012  . Sulfa antibiotics Nausea Only 12/12/2011  . Sulfasalazine Nausea Only 12/12/2011    Past Medical History:  Diagnosis Date  . ADD (attention deficit disorder)   . Anxiety   . Arthritis   . Asthma   . Chronic kidney disease    CKD Stage 3  . Chronic renal insufficiency, stage III (moderate)   . Depression    major depression  . Dermatitis   . Diverticulosis   . Essential tremor 05/17/2012  . Fibromyalgia   . GERD (gastroesophageal reflux disease)   . Hyperlipidemia   . Hypertension   . Memory loss 05/17/2012  . Migraines   . Occasional tremors   . Pneumonia   . TIA (transient ischemic attack)    History of 2 TIA's per pt    Past Surgical History:  Procedure Laterality Date  . ABDOMINAL HYSTERECTOMY    . APPENDECTOMY    . CATARACT EXTRACTION Bilateral   . TONSILLECTOMY AND ADENOIDECTOMY     age 76  . TOTAL HIP ARTHROPLASTY Bilateral 2009   Dr Adriana Mccallum    Family History  Problem Relation Age of Onset  . Liver cancer Mother   . Pulmonary embolism Mother   . Prostate cancer Father   . Stroke Maternal Grandmother   . Heart failure Maternal Grandfather   . Emphysema Maternal Grandfather   . Congestive Heart Failure Maternal Grandfather   . Coronary artery disease Paternal Grandmother   . Coronary artery disease Paternal Grandfather   . Prostate cancer Paternal Uncle        x 2  . ALS Paternal Aunt   . Brain cancer Paternal Aunt   .  Breast cancer Paternal Aunt   . Lung cancer Maternal Aunt   . Depression Brother        suicide  . Diabetes Paternal Aunt        x 2  . Breast cancer Cousin   . Colon cancer Neg Hx   . Esophageal cancer Neg Hx   . Rectal cancer Neg Hx   . Stomach cancer Neg Hx     Social History   Socioeconomic History  . Marital status: Married    Spouse name: Not on file  . Number of children: 2  . Years of education: hs  . Highest education level: Not on file  Occupational History  . Occupation: Retired    Fish farm manager: RETIRED  Tobacco Use  . Smoking status: Never Smoker  . Smokeless  tobacco: Never Used  Substance and Sexual Activity  . Alcohol use: No    Alcohol/week: 0.0 standard drinks  . Drug use: No  . Sexual activity: Never  Other Topics Concern  . Not on file  Social History Narrative   Patient lives at home with her husband [name]).    Retired.   Education high school.   Right handed.   Caffeine two cups of coffee daily and one glass of sweet tea.   Social Determinants of Health   Financial Resource Strain:   . Difficulty of Paying Living Expenses:   Food Insecurity:   . Worried About Charity fundraiser in the Last Year:   . Arboriculturist in the Last Year:   Transportation Needs:   . Film/video editor (Medical):   Marland Kitchen Lack of Transportation (Non-Medical):   Physical Activity:   . Days of Exercise per Week:   . Minutes of Exercise per Session:   Stress:   . Feeling of Stress :   Social Connections:   . Frequency of Communication with Friends and Family:   . Frequency of Social Gatherings with Friends and Family:   . Attends Religious Services:   . Active Member of Clubs or Organizations:   . Attends Archivist Meetings:   Marland Kitchen Marital Status:   Intimate Partner Violence:   . Fear of Current or Ex-Partner:   . Emotionally Abused:   Marland Kitchen Physically Abused:   . Sexually Abused:       Review of systems: All other review of systems negative except  as mentioned in the HPI.   Physical Exam: Vitals:   07/08/19 1358  BP: 120/78  Pulse: 96   Body mass index is 24.03 kg/m. Gen:      No acute distress Abd:       soft, non-tender; no palpable masses, no distension Ext:    No edema; adequate peripheral perfusion Skin:      Warm and dry; no rash Neuro: alert and oriented x 3 Psych: normal mood and affect  Data Reviewed:  Reviewed labs, radiology imaging, old records and pertinent past GI work up   Assessment and Plan/Recommendations:  79 year old very pleasant female with history of chronic irritable bowel syndrome predominant constipation and GERD here for follow-up visit  GERD and dyspepsia: Continue Pepcid, patient is reluctant to use PPI due to potential side effects Use FD guard 1 capsule up to 3 times daily for dyspepsia Antireflux measures Continue with dietary changes and exercise to lose weight  IBS constipation: Persistent symptoms with no significant improvement on Linzess 145 mcg daily, is having to take additional laxatives.  She developed severe diarrhea with higher dose Linzess Will switch to Movantik, advised patient to start taking 12.5 mg daily and if is not improving, can increase the dose to 25 mg daily  Return in 6 months or sooner if needed   The patient was provided an opportunity to ask questions and all were answered. The patient agreed with the plan and demonstrated an understanding of the instructions.  Damaris Hippo , MD    CC: Lajean Manes, MD

## 2019-07-08 NOTE — Patient Instructions (Addendum)
You have been scheduled for a CT scan of the abdomen and pelvis at [facility].  You are scheduled on 07/25/2019  at 3:20pm. You should arrive 20 minutes prior to your appointment time for registration. Please follow the written instructions below on the day of your exam:  WARNING: IF YOU ARE ALLERGIC TO IODINE/X-RAY DYE, PLEASE NOTIFY RADIOLOGY IMMEDIATELY AT [phone]! YOU WILL BE GIVEN A 13 HOUR PREMEDICATION PREP.  1) Do not eat or drink anything after 11:20AM (4 hours prior to your test) 2) You have been given 2 bottles of oral contrast to drink. The solution may taste better if refrigerated, but do NOT add ice or any other liquid to this solution. Shake well before drinking.    Drink 1 bottle of contrast @ 1:20pm (2 hours prior to your exam)  Drink 1 bottle of contrast @ 2:20pm (1 hour prior to your exam)   The purpose of you drinking the oral contrast is to aid in the visualization of your intestinal tract. The contrast solution may cause some diarrhea. Depending on your individual set of symptoms, you may also receive an intravenous injection of x-ray contrast/dye. Plan on being at [facility] for 30 minutes or longer, depending on the type of exam you are having performed.  This test typically takes 30-45 minutes to complete.  If you have any questions regarding your exam or if you need to reschedule, you may call the CT department at [phone] between the hours of 8:00 am and 5:00 pm, Monday-Friday.  ________________________________________________________________________  We have sent the following medications to your pharmacy for you to pick up at your convenience: Movantik, we have given you samples of Movantik 25mg  to try (take 1/2 tablet daily)  Please take one tablespoon of benefiber 3 times a day.  Please purchase over the counter FDgard and take one capsule three times a day as needed.   Follow up with Dr Silverio Decamp in 6  months.  I appreciate the opportunity to care for you. Damaris Hippo

## 2019-07-09 ENCOUNTER — Other Ambulatory Visit: Payer: Self-pay | Admitting: Gastroenterology

## 2019-07-12 ENCOUNTER — Ambulatory Visit: Payer: Medicare Other | Admitting: Gastroenterology

## 2019-07-16 ENCOUNTER — Encounter: Payer: Self-pay | Admitting: Gastroenterology

## 2019-07-18 ENCOUNTER — Telehealth: Payer: Self-pay | Admitting: Gastroenterology

## 2019-07-18 NOTE — Telephone Encounter (Signed)
Patient calling- states that she had appointment with Dr. Silverio Decamp a couple days ago- and scheduled a CT scan- She cancelled CT because she has a rash and does not want to take contrast while taking the medication she is on. Wants to wait until rash is gone and done with the medication she is on before doing it. She wanted to let you know why she cancelled.

## 2019-07-18 NOTE — Telephone Encounter (Signed)
Noted  

## 2019-07-24 ENCOUNTER — Other Ambulatory Visit: Payer: Self-pay | Admitting: Adult Health

## 2019-07-25 ENCOUNTER — Other Ambulatory Visit: Payer: Medicare Other

## 2019-07-27 DIAGNOSIS — R21 Rash and other nonspecific skin eruption: Secondary | ICD-10-CM | POA: Diagnosis not present

## 2019-07-27 DIAGNOSIS — K140 Glossitis: Secondary | ICD-10-CM | POA: Diagnosis not present

## 2019-07-27 DIAGNOSIS — R350 Frequency of micturition: Secondary | ICD-10-CM | POA: Diagnosis not present

## 2019-08-11 DIAGNOSIS — F325 Major depressive disorder, single episode, in full remission: Secondary | ICD-10-CM | POA: Diagnosis not present

## 2019-08-11 DIAGNOSIS — E039 Hypothyroidism, unspecified: Secondary | ICD-10-CM | POA: Diagnosis not present

## 2019-08-11 DIAGNOSIS — E7800 Pure hypercholesterolemia, unspecified: Secondary | ICD-10-CM | POA: Diagnosis not present

## 2019-08-11 DIAGNOSIS — F322 Major depressive disorder, single episode, severe without psychotic features: Secondary | ICD-10-CM | POA: Diagnosis not present

## 2019-08-11 DIAGNOSIS — I10 Essential (primary) hypertension: Secondary | ICD-10-CM | POA: Diagnosis not present

## 2019-08-20 ENCOUNTER — Other Ambulatory Visit: Payer: Medicare Other

## 2019-08-22 NOTE — Telephone Encounter (Signed)
Error

## 2019-08-29 DIAGNOSIS — L308 Other specified dermatitis: Secondary | ICD-10-CM | POA: Diagnosis not present

## 2019-08-29 DIAGNOSIS — R21 Rash and other nonspecific skin eruption: Secondary | ICD-10-CM | POA: Diagnosis not present

## 2019-08-29 DIAGNOSIS — Z79899 Other long term (current) drug therapy: Secondary | ICD-10-CM | POA: Diagnosis not present

## 2019-08-29 DIAGNOSIS — L309 Dermatitis, unspecified: Secondary | ICD-10-CM | POA: Diagnosis not present

## 2019-08-29 DIAGNOSIS — D1721 Benign lipomatous neoplasm of skin and subcutaneous tissue of right arm: Secondary | ICD-10-CM | POA: Diagnosis not present

## 2019-09-03 DIAGNOSIS — M4317 Spondylolisthesis, lumbosacral region: Secondary | ICD-10-CM | POA: Diagnosis not present

## 2019-09-03 DIAGNOSIS — M47816 Spondylosis without myelopathy or radiculopathy, lumbar region: Secondary | ICD-10-CM | POA: Diagnosis not present

## 2019-09-12 DIAGNOSIS — L309 Dermatitis, unspecified: Secondary | ICD-10-CM | POA: Diagnosis not present

## 2019-09-27 DIAGNOSIS — D692 Other nonthrombocytopenic purpura: Secondary | ICD-10-CM | POA: Diagnosis not present

## 2019-09-27 DIAGNOSIS — R21 Rash and other nonspecific skin eruption: Secondary | ICD-10-CM | POA: Diagnosis not present

## 2019-10-03 ENCOUNTER — Other Ambulatory Visit: Payer: Self-pay | Admitting: Obstetrics and Gynecology

## 2019-10-03 ENCOUNTER — Other Ambulatory Visit: Payer: Self-pay | Admitting: Geriatric Medicine

## 2019-10-03 DIAGNOSIS — Z1231 Encounter for screening mammogram for malignant neoplasm of breast: Secondary | ICD-10-CM

## 2019-10-08 DIAGNOSIS — N3941 Urge incontinence: Secondary | ICD-10-CM | POA: Diagnosis not present

## 2019-10-08 DIAGNOSIS — N3020 Other chronic cystitis without hematuria: Secondary | ICD-10-CM | POA: Diagnosis not present

## 2019-10-08 DIAGNOSIS — N952 Postmenopausal atrophic vaginitis: Secondary | ICD-10-CM | POA: Diagnosis not present

## 2019-10-09 DIAGNOSIS — F325 Major depressive disorder, single episode, in full remission: Secondary | ICD-10-CM | POA: Diagnosis not present

## 2019-10-09 DIAGNOSIS — I10 Essential (primary) hypertension: Secondary | ICD-10-CM | POA: Diagnosis not present

## 2019-10-09 DIAGNOSIS — E7800 Pure hypercholesterolemia, unspecified: Secondary | ICD-10-CM | POA: Diagnosis not present

## 2019-10-09 DIAGNOSIS — E039 Hypothyroidism, unspecified: Secondary | ICD-10-CM | POA: Diagnosis not present

## 2019-10-09 DIAGNOSIS — F322 Major depressive disorder, single episode, severe without psychotic features: Secondary | ICD-10-CM | POA: Diagnosis not present

## 2019-10-15 ENCOUNTER — Ambulatory Visit: Payer: Medicare Other

## 2019-10-28 ENCOUNTER — Ambulatory Visit: Payer: Medicare Other

## 2019-11-02 ENCOUNTER — Other Ambulatory Visit: Payer: Self-pay | Admitting: Gastroenterology

## 2019-11-10 ENCOUNTER — Other Ambulatory Visit: Payer: Self-pay | Admitting: Gastroenterology

## 2019-11-11 ENCOUNTER — Other Ambulatory Visit: Payer: Self-pay | Admitting: Gastroenterology

## 2019-11-12 ENCOUNTER — Ambulatory Visit: Payer: Medicare Other

## 2019-11-21 ENCOUNTER — Telehealth: Payer: Self-pay | Admitting: Gastroenterology

## 2019-11-21 NOTE — Telephone Encounter (Signed)
Pt is requesting a call back from a nurse, pt states she is trying to purchase stool softeners but she is breaking out, she states it is mostly stuff that contains the polyethylene glycol. Pt would like to also discuss the CT scan that Dr Silverio Decamp ordered for her.

## 2019-11-22 DIAGNOSIS — L308 Other specified dermatitis: Secondary | ICD-10-CM | POA: Diagnosis not present

## 2019-11-22 NOTE — Telephone Encounter (Signed)
Spoke with the patient. She was scheduled for a CT of the abd/pelvis in July. She cancelled the imaging. She wants to know if she can do the test now and without the oral contrast. She is concerned about the possibility of diarrhea from the contrast. She lives 30 minutes away and feels this could cause her issues. Patient is due follow up in November. Scheduled the follow up appointment. She will discuss imaging at the appointment.

## 2019-11-27 DIAGNOSIS — R21 Rash and other nonspecific skin eruption: Secondary | ICD-10-CM | POA: Diagnosis not present

## 2019-11-27 DIAGNOSIS — Z79899 Other long term (current) drug therapy: Secondary | ICD-10-CM | POA: Diagnosis not present

## 2019-11-27 DIAGNOSIS — Z23 Encounter for immunization: Secondary | ICD-10-CM | POA: Diagnosis not present

## 2019-11-27 DIAGNOSIS — R300 Dysuria: Secondary | ICD-10-CM | POA: Diagnosis not present

## 2019-11-27 DIAGNOSIS — I10 Essential (primary) hypertension: Secondary | ICD-10-CM | POA: Diagnosis not present

## 2019-11-28 ENCOUNTER — Ambulatory Visit
Admission: RE | Admit: 2019-11-28 | Discharge: 2019-11-28 | Disposition: A | Discharge status: Home / Self Care | Payer: Medicare Other | Source: Ambulatory Visit | Attending: Geriatric Medicine | Admitting: Geriatric Medicine

## 2019-11-28 ENCOUNTER — Other Ambulatory Visit: Payer: Self-pay

## 2019-11-28 DIAGNOSIS — Z1231 Encounter for screening mammogram for malignant neoplasm of breast: Secondary | ICD-10-CM

## 2019-12-02 DIAGNOSIS — Z01419 Encounter for gynecological examination (general) (routine) without abnormal findings: Secondary | ICD-10-CM | POA: Diagnosis not present

## 2019-12-03 ENCOUNTER — Other Ambulatory Visit: Payer: Self-pay | Admitting: Gastroenterology

## 2019-12-23 ENCOUNTER — Ambulatory Visit: Payer: Medicare Other | Admitting: Adult Health

## 2019-12-25 ENCOUNTER — Other Ambulatory Visit: Payer: Self-pay | Admitting: Gastroenterology

## 2019-12-27 ENCOUNTER — Ambulatory Visit: Payer: Medicare Other | Admitting: Gastroenterology

## 2020-01-01 DIAGNOSIS — L309 Dermatitis, unspecified: Secondary | ICD-10-CM | POA: Diagnosis not present

## 2020-01-01 DIAGNOSIS — D692 Other nonthrombocytopenic purpura: Secondary | ICD-10-CM | POA: Diagnosis not present

## 2020-01-19 ENCOUNTER — Other Ambulatory Visit: Payer: Self-pay | Admitting: Gastroenterology

## 2020-01-27 ENCOUNTER — Other Ambulatory Visit: Payer: Self-pay | Admitting: Gastroenterology

## 2020-02-13 DIAGNOSIS — R300 Dysuria: Secondary | ICD-10-CM | POA: Diagnosis not present

## 2020-02-13 DIAGNOSIS — L239 Allergic contact dermatitis, unspecified cause: Secondary | ICD-10-CM | POA: Diagnosis not present

## 2020-02-25 ENCOUNTER — Other Ambulatory Visit: Payer: Self-pay | Admitting: Gastroenterology

## 2020-02-25 DIAGNOSIS — I700 Atherosclerosis of aorta: Secondary | ICD-10-CM | POA: Diagnosis not present

## 2020-02-25 DIAGNOSIS — I10 Essential (primary) hypertension: Secondary | ICD-10-CM | POA: Diagnosis not present

## 2020-02-25 DIAGNOSIS — R21 Rash and other nonspecific skin eruption: Secondary | ICD-10-CM | POA: Diagnosis not present

## 2020-02-25 DIAGNOSIS — E7800 Pure hypercholesterolemia, unspecified: Secondary | ICD-10-CM | POA: Diagnosis not present

## 2020-02-25 DIAGNOSIS — R634 Abnormal weight loss: Secondary | ICD-10-CM | POA: Diagnosis not present

## 2020-02-25 DIAGNOSIS — Z79899 Other long term (current) drug therapy: Secondary | ICD-10-CM | POA: Diagnosis not present

## 2020-03-10 DIAGNOSIS — I129 Hypertensive chronic kidney disease with stage 1 through stage 4 chronic kidney disease, or unspecified chronic kidney disease: Secondary | ICD-10-CM | POA: Diagnosis not present

## 2020-03-10 DIAGNOSIS — Z79899 Other long term (current) drug therapy: Secondary | ICD-10-CM | POA: Diagnosis not present

## 2020-03-10 DIAGNOSIS — F331 Major depressive disorder, recurrent, moderate: Secondary | ICD-10-CM | POA: Diagnosis not present

## 2020-03-10 DIAGNOSIS — I10 Essential (primary) hypertension: Secondary | ICD-10-CM | POA: Diagnosis not present

## 2020-03-10 DIAGNOSIS — N1831 Chronic kidney disease, stage 3a: Secondary | ICD-10-CM | POA: Diagnosis not present

## 2020-03-17 DIAGNOSIS — M4000 Postural kyphosis, site unspecified: Secondary | ICD-10-CM | POA: Diagnosis not present

## 2020-03-17 DIAGNOSIS — I10 Essential (primary) hypertension: Secondary | ICD-10-CM | POA: Diagnosis not present

## 2020-03-25 ENCOUNTER — Other Ambulatory Visit: Payer: Self-pay

## 2020-03-25 ENCOUNTER — Ambulatory Visit (INDEPENDENT_AMBULATORY_CARE_PROVIDER_SITE_OTHER): Payer: Medicare Other | Admitting: Allergy & Immunology

## 2020-03-25 ENCOUNTER — Encounter: Payer: Self-pay | Admitting: Allergy & Immunology

## 2020-03-25 VITALS — BP 110/62 | HR 73 | Temp 97.8°F | Resp 17 | Ht 63.98 in | Wt 131.8 lb

## 2020-03-25 DIAGNOSIS — K9049 Malabsorption due to intolerance, not elsewhere classified: Secondary | ICD-10-CM

## 2020-03-25 DIAGNOSIS — Z91018 Allergy to other foods: Secondary | ICD-10-CM | POA: Diagnosis not present

## 2020-03-25 DIAGNOSIS — R21 Rash and other nonspecific skin eruption: Secondary | ICD-10-CM | POA: Diagnosis not present

## 2020-03-25 DIAGNOSIS — L249 Irritant contact dermatitis, unspecified cause: Secondary | ICD-10-CM

## 2020-03-25 DIAGNOSIS — J310 Chronic rhinitis: Secondary | ICD-10-CM

## 2020-03-25 DIAGNOSIS — D721 Eosinophilia, unspecified: Secondary | ICD-10-CM

## 2020-03-25 MED ORDER — PREDNISONE 10 MG PO TABS: ORAL_TABLET | ORAL | 0 refills | Status: DC

## 2020-03-25 NOTE — Progress Notes (Addendum)
NEW PATIENT  Date of Service/Encounter:  03/25/20  Referring provider: Merlene Laughter, MD   Assessment:   Rash - has been biopsied in the past (report pending)    Reported hypereosinophilia  Adverse food reaction - with testing only positive to watermelon  Chronic rhinitis (trees, cats)  Plan/Recommendations:   1. Rash - ? eczema involving around 25% of her body - Testing to the environmental allergy panel was positive only to trees and cats. - Testing to the entire food panel was positive only to watermelon. - Copy of testing results provided.  - We are going to get an extensive lab workup to rule out serious causes of hives/swelling. - Some of the spots look more consistent with eczema.  - We will call you in 1-2 weeks with the results of the testing. - In the meantime, start Allegra 180mg  one tablet once or twice daily to control the itching. - Start the prolonged prednisone taper. - We are going to consider starting Dupixent to see if this helps with the itching (hives versus eczema).  - Information on Dupixent provided today.  - We can look into starting that at the next visit.  - I am going to get the biopsy results as well so we can put some pieces together.   2. Return in about 3 weeks (around 04/15/2020).   Subjective:   [name] is a 80 y.o. female presenting today for evaluation of  Chief Complaint  Patient presents with  . Rash    [name] has a history of the following: Patient Active Problem List   Diagnosis Date Noted  . Oral thrush 11/13/2017  . Constipation 06/05/2014  . Generalized abdominal cramping 06/05/2014  . Dyspnea 05/24/2012  . Essential tremor 05/17/2012  . Memory loss 05/17/2012  . Migraine headache 03/04/2012  . Chest pain 03/03/2012  . Fibromyalgia   . Hypertension   . Hyperlipidemia   . GERD (gastroesophageal reflux disease)   . Asthma   . Depression   . Diverticulosis   . ANXIETY DEPRESSION 12/08/2009  .  GERD 12/08/2009  . COLONIC POLYPS, ADENOMATOUS, HX OF 12/08/2009    History obtained from: chart review and patient.  Damaris Schooner was referred by Merlene Laughter, MD.     [name] is a 80 y.o. female presenting for an evaluation of a rash.  She has had a rash for 14 months. She reports that she is allergic to "elastic". She got a bran new Temperpedic mattress which she did not get until September. She had the rash before this, however. She was seeing Dr. Pete Glatter one day. She had a place on her leg that kept itching and she was told that it was a bite that she "let go". She now has itching over her bilateral legs and over her legs and in her groin.  She has been given hydroxyzine which made her depression worse. She has used 4 jars of triamcinolone which has not helped at all. The triamcinolone did not do anything at all. It did help for a bit, but then worsened. It would continue to come back with a vengeance. She used four jars of the steroids. It is all over her body.   She was on cetirizine but it made her "feel bad mentally". She does not like Benadryl at night because it is making her cognitively slower and she does not want to make that worse. Overall she hates the medications since it makes her memory worse. She  has been on the triamcinolone as mentioned above. She has also been on hydrocortisone without much improvement. Eucrisa does not sound familiar. She is unsure whether she has been on Protopic of Elidel.   She has been biopsied by Dr. Venancio Poisson at [facility]. She is unsure of the biopsy. She did have "high eosinophils" at some point in time.   Her husband is a "pill addict". This is causing a lot of pain/discomfort for her emotionally. He apparently goes to nursing homes to purchase pills from the residents.   Otherwise, there is no history of other atopic diseases, including asthma, food allergies, drug allergies, environmental allergies, stinging insect allergies or  contact dermatitis. There is no significant infectious history. Vaccinations are up to date.    Past Medical History: Patient Active Problem List   Diagnosis Date Noted  . Oral thrush 11/13/2017  . Constipation 06/05/2014  . Generalized abdominal cramping 06/05/2014  . Dyspnea 05/24/2012  . Essential tremor 05/17/2012  . Memory loss 05/17/2012  . Migraine headache 03/04/2012  . Chest pain 03/03/2012  . Fibromyalgia   . Hypertension   . Hyperlipidemia   . GERD (gastroesophageal reflux disease)   . Asthma   . Depression   . Diverticulosis   . ANXIETY DEPRESSION 12/08/2009  . GERD 12/08/2009  . COLONIC POLYPS, ADENOMATOUS, HX OF 12/08/2009    Medication List:  Allergies as of 03/25/2020      Reactions   Crestor [rosuvastatin Calcium]    myalgia   Zocor [simvastatin]    myalgia   Enalapril    swelling   Lamictal [lamotrigine]    rash   Nitrofuran Derivatives Nausea Only   Erythromycin Other (See Comments)   Stomach burning   Methylprednisolone    Major depression, weakness, nausea   Nystatin Other (See Comments)   Blisters on her lips   Vivelle [estradiol]    rash   Zetia [ezetimibe]    Muscle weakness   Ciprofloxacin Nausea Only   Ciprofloxacin Hcl    rash   Latex Rash   Shellfish Allergy Rash   Sulfa Antibiotics Nausea Only   Sulfasalazine Nausea Only      Medication List       Accurate as of March 25, 2020  4:57 PM. If you have any questions, ask your nurse or doctor.        STOP taking these medications   hydrocortisone 2.5 % cream Stopped by: Alfonse Spruce, MD   naloxegol oxalate 12.5 MG Tabs tablet Commonly known as: Movantik Stopped by: Alfonse Spruce, MD     TAKE these medications   acetaZOLAMIDE 125 MG tablet Commonly known as: DIAMOX TAKE 1 TABLET (125 MG TOTAL) BY MOUTH DAILY.   albuterol 108 (90 Base) MCG/ACT inhaler Commonly known as: VENTOLIN HFA TAKE 2 PUFFS BY MOUTH EVERY 6 HOURS AS NEEDED FOR WHEEZE OR SHORTNESS  OF BREATH   aspirin EC 81 MG tablet Take 81 mg by mouth daily.   Bayer Womens 81-777 MG Tabs Generic drug: Aspirin-Calcium Carbonate Women's Aspirin with Calcium 81 mg-300 mg calcium (777 mg) tablet   81 mg by oral route.   bismuth subsalicylate 262 MG/15ML suspension Commonly known as: PEPTO BISMOL Take 30 mLs by mouth every 6 (six) hours as needed.   cyanocobalamin 100 MCG tablet Take by mouth.   docusate sodium 250 MG capsule Commonly known as: COLACE Take 250 mg by mouth daily.   DULoxetine 30 MG capsule Commonly known as: CYMBALTA Take 30 mg  by mouth daily.   ergocalciferol 1.25 MG (50000 UT) capsule Commonly known as: VITAMIN D2 Take 50,000 Units by mouth once a week.   famotidine 20 MG tablet Commonly known as: PEPCID TAKE 1 TABLET 3 (THREE) TIMES DAILY (BEFORE BREAKFAST AND DINNER AND ANOTHER ONE BEFORE BEDTIME)   HYDROcodone-acetaminophen 7.5-325 MG tablet Commonly known as: NORCO Take 1 tablet by mouth 2 (two) times daily. For pain   hyoscyamine 0.125 MG SL tablet Commonly known as: LEVSIN SL DISSOLVE 1 TABLET UNDER THE TONGUE EVERY 6 HOURS AS NEEDED   ipratropium 0.06 % nasal spray Commonly known as: ATROVENT 2 sprays by Each Nare route Three (3) times a day.   levothyroxine 50 MCG tablet Commonly known as: SYNTHROID Once daily   Linzess 145 MCG Caps capsule Generic drug: linaclotide TAKE 1 CAPSULE (145 MCG TOTAL) BY MOUTH DAILY BEFORE BREAKFAST. What changed: Another medication with the same name was removed. Continue taking this medication, and follow the directions you see here. Changed by: Alfonse Spruce, MD   LORazepam 1 MG tablet Commonly known as: ATIVAN Take 1 tablet by mouth at bedtime.   losartan 100 MG tablet Commonly known as: COZAAR Take 100 mg by mouth daily.   predniSONE 10 MG tablet Commonly known as: DELTASONE Take 3 tabs (30mg ) twice daily for 3 days, then 2 tabs (20mg ) twice daily for 3 days, then 1 tab (10mg ) twice  daily for 3 days, then STOP. Started by: Alfonse Spruce, MD   SUMAtriptan 50 MG tablet Commonly known as: IMITREX Take 50 mg by mouth every 2 (two) hours as needed. For migraine   triamcinolone 0.1 % Commonly known as: KENALOG Apply topically 3 (three) times daily.       Birth History: non-contributory  Developmental History: non-contributory  Past Surgical History: Past Surgical History:  Procedure Laterality Date  . ABDOMINAL HYSTERECTOMY    . APPENDECTOMY    . CATARACT EXTRACTION Bilateral   . TONSILLECTOMY AND ADENOIDECTOMY     age 5  . TOTAL HIP ARTHROPLASTY Bilateral 2009   Dr Lajoyce Corners     Family History: Family History  Problem Relation Age of Onset  . Liver cancer Mother   . Pulmonary embolism Mother   . Prostate cancer Father   . Stroke Maternal Grandmother   . Heart failure Maternal Grandfather   . Emphysema Maternal Grandfather   . Congestive Heart Failure Maternal Grandfather   . Coronary artery disease Paternal Grandmother   . Coronary artery disease Paternal Grandfather   . Prostate cancer Paternal Uncle        x 2  . ALS Paternal Aunt   . Brain cancer Paternal Aunt   . Breast cancer Paternal Aunt   . Lung cancer Maternal Aunt   . Depression Brother        suicide  . Diabetes Paternal Aunt        x 2  . Breast cancer Cousin   . Colon cancer Neg Hx   . Esophageal cancer Neg Hx   . Rectal cancer Neg Hx   . Stomach cancer Neg Hx      Social History: Turkessa lives at home with her husband.  She does not have that is 17 years old.  There is volume in the main living areas and carpeting in the bedroom.  They have electric heating and central cooling with a heat pump.  There is a cat outside of the home.  There are dust mite covers on the  bed, but not the pillows.  There is no tobacco exposure.  She does not use a HEPA filter in the home.  [place]   Review of Systems  Constitutional: Negative.   Negative for chills, fever, malaise/fatigue and weight loss.  HENT: Negative.  Negative for congestion, ear discharge, ear pain and sore throat.   Eyes: Negative for pain, discharge and redness.  Respiratory: Negative for cough, sputum production, shortness of breath and wheezing.   Cardiovascular: Negative.  Negative for chest pain and palpitations.  Gastrointestinal: Negative for abdominal pain, constipation, diarrhea, heartburn, nausea and vomiting.  Skin: Positive for itching and rash.  Neurological: Negative for dizziness and headaches.  Endo/Heme/Allergies: Negative for environmental allergies. Does not bruise/bleed easily.       Objective:   Blood pressure 110/62, pulse 73, temperature 97.8 F (36.6 C), temperature source Temporal, resp. rate 17, height 5' 3.98" (1.625 m), weight 131 lb 12.8 oz (59.8 kg), SpO2 98 %. Body mass index is 22.64 kg/m.   Physical Exam:   Physical Exam Constitutional:      Appearance: She is well-developed.  HENT:     Head: Normocephalic and atraumatic.     Right Ear: Tympanic membrane, ear canal and external ear normal. No drainage, swelling or tenderness. Tympanic membrane is not injected, scarred, erythematous, retracted or bulging.     Left Ear: Tympanic membrane, ear canal and external ear normal. No drainage, swelling or tenderness. Tympanic membrane is not injected, scarred, erythematous, retracted or bulging.     Nose: No nasal deformity, septal deviation, mucosal edema, rhinorrhea or epistaxis.     Right Turbinates: Not enlarged, swollen or pale.     Left Turbinates: Not enlarged, swollen or pale.     Right Sinus: No maxillary sinus tenderness or frontal sinus tenderness.     Left Sinus: No maxillary sinus tenderness or frontal sinus tenderness.     Mouth/Throat:     Mouth: Oropharynx is clear and moist. Mucous membranes are not pale and not dry.     Pharynx: Uvula midline.  Eyes:     General:        Right eye: No discharge.         Left eye: No discharge.     Extraocular Movements: EOM normal.     Conjunctiva/sclera: Conjunctivae normal.     Right eye: Right conjunctiva is not injected. No chemosis.    Left eye: Left conjunctiva is not injected. No chemosis.    Pupils: Pupils are equal, round, and reactive to light.  Cardiovascular:     Rate and Rhythm: Normal rate and regular rhythm.     Heart sounds: Normal heart sounds.  Pulmonary:     Effort: Pulmonary effort is normal. No tachypnea, accessory muscle usage or respiratory distress.     Breath sounds: Normal breath sounds. No wheezing, rhonchi or rales.  Chest:     Chest wall: No tenderness.  Abdominal:     Tenderness: There is no abdominal tenderness. There is no guarding or rebound.  Lymphadenopathy:     Head:     Right side of head: No submandibular, tonsillar or occipital adenopathy.     Left side of head: No submandibular, tonsillar or occipital adenopathy.     Cervical: No cervical adenopathy.  Skin:    General: Skin is warm.     Capillary Refill: Capillary refill takes less than 2 seconds.     Coloration: Skin is not pale.  Findings: No abrasion, erythema, petechiae or rash. Rash is not papular, urticarial or vesicular.     Comments: Multiple excoriations present over her entire body. There are some lesions that appear very consistent with eczema (see pictures below) scattered over her entire body.   Neurological:     Mental Status: She is alert.  Psychiatric:        Mood and Affect: Mood and affect normal.        Behavior: Behavior is cooperative.         Diagnostic studies: labs sent instead   Allergy Studies:     Airborne Adult Perc - 03/25/20 1453    Time Antigen Placed 1453    Allergen Manufacturer Waynette Buttery    Location Back    Number of Test 59    Panel 1 Select    1. Control-Buffer 50% Glycerol Negative    2. Control-Histamine 1 mg/ml 2+    3. Albumin saline Negative    4. Bahia Negative    5. French Southern Territories Negative    6. Johnson  Negative    7. Kentucky Blue Negative    8. Meadow Fescue Negative    9. Perennial Rye Negative    10. Sweet Vernal Negative    11. Timothy Negative    12. Cocklebur Negative    13. Burweed Marshelder Negative    14. Ragweed, short Negative    15. Ragweed, Giant Negative    16. Plantain,  English Negative    17. Lamb's Quarters Negative    18. Sheep Sorrell Negative    19. Rough Pigweed Negative    20. Marsh Elder, Rough Negative    21. Mugwort, Common Negative    22. Ash mix 2+    23. Birch mix 3+    24. Beech American 3+    25. Box, Elder Negative    26. Cedar, red Negative    27. Cottonwood, Eastern 2+    28. Elm mix 2+    29. Hickory 2+    30. Maple mix Negative    31. Oak, Guinea-Bissau mix Negative    32. Pecan Pollen Negative    33. Pine mix Negative    34. Sycamore Eastern 2+    35. Walnut, Black Pollen Negative    36. Alternaria alternata Negative    37. Cladosporium Herbarum Negative    38. Aspergillus mix Negative    39. Penicillium mix Negative    40. Bipolaris sorokiniana (Helminthosporium) Negative    41. Drechslera spicifera (Curvularia) Negative    42. Mucor plumbeus Negative    43. Fusarium moniliforme Negative    44. Aureobasidium pullulans (pullulara) Negative    45. Rhizopus oryzae Negative    46. Botrytis cinera Negative    47. Epicoccum nigrum Negative    48. Phoma betae Negative    49. Candida Albicans Negative    50. Trichophyton mentagrophytes Negative    51. Mite, D Farinae  5,000 AU/ml Negative    52. Mite, D Pteronyssinus  5,000 AU/ml Negative    53. Cat Hair 10,000 BAU/ml 3+    54.  Dog Epithelia Negative    55. Mixed Feathers Negative    56. Horse Epithelia Negative    57. Cockroach, German Negative    58. Mouse Negative    59. Tobacco Leaf Negative          Food Adult Perc - 03/25/20 1400    Time Antigen Placed 1453    Allergen Manufacturer Waynette Buttery    Location  Back    Number of allergen test 72     Control-buffer 50% Glycerol  Negative    Control-Histamine 1 mg/ml 2+    1. Peanut Negative    2. Soybean Negative    3. Wheat Negative    4. Sesame Negative    5. Milk, cow Negative    6. Egg White, Chicken Negative    7. Casein Negative    8. Shellfish Mix Negative    9. Fish Mix Negative    10. Cashew Negative    11. Pecan Food Negative    12. Walnut Food Negative    13. Almond Negative    14. Hazelnut Negative    15. [place] nut Negative    16. Coconut Negative    17. Pistachio Negative    18. Catfish Negative    19. Bass Negative    20. Trout Negative    21. Tuna Negative    22. Salmon Negative    23. Flounder Negative    24. Codfish Negative    25. Shrimp Negative    26. Crab Negative    27. Lobster Negative    28. Oyster Negative    29. Scallops Negative    30. Barley Negative    31. Oat  Negative    32. Rye  Negative    33. Hops Negative    34. Rice Negative    35. Cottonseed Negative    36. Saccharomyces Cerevisiae  Negative    37. Pork Negative    38. Mal[place] Meat Negative    39. Chicken Meat Negative    40. Beef Negative    41. Lamb Negative    42. Tomato Negative    43. White Potato Negative    44. Sweet Potato Negative    45. Pea, Green/English Negative    46. Navy Bean Negative    47. Mushrooms Negative    48. Avocado Negative    49. Onion Negative    50. Cabbage Negative    51. Carrots Negative    52. Celery Negative    53. Corn Negative    54. Cucumber Negative    55. Grape (White seedless) Negative    56. Orange  Negative    57. Banana Negative    58. Apple Negative    59. Peach Negative    60. Strawberry Negative    61. Cantaloupe Negative    62. Watermelon --   2x4   63. Pineapple Negative    64. Chocolate/Cacao bean Negative    65. Karaya Gum Negative    66. Acacia (Arabic Gum) Negative    67. Cinnamon Negative    68. Nutmeg Negative    69. Ginger Negative    70. Garlic Negative    71. Pepper, black Negative    72. Mustard Negative    Comments --            Allergy testing results were read and interpreted by myself, documented by clinical staff.         Malachi Bonds, MD [facility]

## 2020-03-25 NOTE — Patient Instructions (Addendum)
1. Rash - Testing to the environmental allergy panel was positive only to trees and cats. - Testing to the entire food panel was positive only to watermelon. - Copy of testing results provided.  - We are going to get an extensive lab workup to rule out serious causes of hives/swelling. - Some of the spots look more consistent with eczema.  - We will call you in 1-2 weeks with the results of the testing. - In the meantime, start Allegra 180mg  one tablet once or twice daily to control the itching. - Start the prolonged prednisone taper. - We are going to consider starting Dupixent to see if this helps with the itching (hives versus eczema).  - Information on Dupixent provided today.  - We can look into starting that at the next visit.  - I am going to get the biopsy results as well so we can put some pieces together.   2. Return in about 3 weeks (around 04/15/2020).    Please inform us of any Emergency Department visits, hospitalizations, or changes in symptoms. Call us before going to the ED for breathing or allergy symptoms since we might be able to fit you in for a sick visit. Feel free to contact us anytime with any questions, problems, or concerns.  It was a pleasure to meet you today!  Websites that have reliable patient information: 1. American Academy of Asthma, Allergy, and Immunology: www.aaaai.org 2. Food Allergy Research and Education (FARE): foodallergy.org 3. Mothers of Asthmatics: http://www.asthmacommunitynetwork.org 4. American College of Allergy, Asthma, and Immunology: www.acaai.org   COVID-19 Vaccine Information can be found at: [email or web address].[email or web address].uk For questions related to vaccine distribution or appointments, please email [email or web address] or call [phone].   We realize that you might be concerned about having an allergic reaction to the COVID19 vaccines. To help with that concern, WE ARE OFFERING THE  COVID19 VACCINES IN OUR OFFICE! Ask the front desk for dates!     "Like" [place] on Facebook and Instagram for our latest updates!       Make sure you are registered to vote! If you have moved or changed any of your contact information, you will need to get this updated before voting!  In some cases, you MAY be able to register to vote online: [email or web address]    Reducing Pollen Exposure  The American Academy of Allergy, Asthma and Immunology suggests the following steps to reduce your exposure to pollen during allergy seasons.    1. Do not hang sheets or clothing out to dry; pollen may collect on these items. 2. Do not mow lawns or spend time around freshly cut grass; mowing stirs up pollen. 3. Keep windows closed at night.  Keep car windows closed while driving. 4. Minimize morning activities outdoors, a time when pollen counts are usually at their highest. 5. Stay indoors as much as possible when pollen counts or humidity is high and on windy days when pollen tends to remain in the air longer. 6. Use air conditioning when possible.  Many air conditioners have filters that trap the pollen spores. 7. Use a HEPA room air filter to remove pollen form the indoor air you breathe.  Control of Dog or Cat Allergen  Avoidance is the best way to manage a dog or cat allergy. If you have a dog or cat and are allergic to dog or cats, consider removing the dog or cat from the home. If you have a dog or cat but don't want to find it a new  home, or if your family wants a pet even though someone in the household is allergic, here are some strategies that may help keep symptoms at bay:  1. Keep the pet out of your bedroom and restrict it to only a few rooms. Be advised that keeping the dog or cat in only one room will not limit the allergens to that room. 2. Don't pet, hug or kiss the dog or cat; if you do, wash your hands with soap and water. 3. High-efficiency  particulate air (HEPA) cleaners run continuously in a bedroom or living room can reduce allergen levels over time. 4. Regular use of a high-efficiency vacuum cleaner or a central vacuum can reduce allergen levels. 5. Giving your dog or cat a bath at least once a week can reduce airborne allergen.

## 2020-03-26 ENCOUNTER — Encounter: Payer: Self-pay | Admitting: Allergy & Immunology

## 2020-03-27 LAB — STRONGYLOIDES, AB, IGG: Strongyloides, Ab, IgG: NEGATIVE

## 2020-03-29 LAB — IGE+ALLERGENS ZONE 2(30)
Alternaria Alternata IgE: <0.1 kU/L
Amer Sycamore IgE Qn: 0.12 kU/L — AB
Aspergillus Fumigatus IgE: <0.1 kU/L
Bahia Grass IgE: 0.12 kU/L — AB
Bermuda Grass IgE: <0.1 kU/L
Cat Dander IgE: 2.15 kU/L — AB
Cedar, Mountain IgE: <0.1 kU/L
Cladosporium Herbarum IgE: <0.1 kU/L
Cockroach, American IgE: <0.1 kU/L
Common Silver Birch IgE: 0.14 kU/L — AB
D Farinae IgE: <0.1 kU/L
D Pteronyssinus IgE: <0.1 kU/L
Dog Dander IgE: 0.87 kU/L — AB
Elm, American IgE: 0.13 kU/L — AB
Hickory, White IgE: <0.1 kU/L
IgE (Immunoglobulin E), Serum: 36 IU/mL (ref 6–495)
Johnson Grass IgE: <0.1 kU/L
Maple/Box Elder IgE: <0.1 kU/L
Mucor Racemosus IgE: <0.1 kU/L
Mugwort IgE Qn: <0.1 kU/L
Nettle IgE: <0.1 kU/L
Oak, White IgE: <0.1 kU/L
Penicillium Chrysogen IgE: <0.1 kU/L
Pigweed, Rough IgE: <0.1 kU/L
Plantain, English IgE: 0.12 kU/L — AB
Ragweed, Short IgE: <0.1 kU/L
Sheep Sorrel IgE Qn: 0.11 kU/L — AB
Stemphylium Herbarum IgE: <0.1 kU/L
Sweet gum IgE RAST Ql: <0.1 kU/L
Timothy Grass IgE: 0.15 kU/L — AB
White Mulberry IgE: <0.1 kU/L

## 2020-03-29 LAB — ALLERGEN PROFILE, BASIC FOOD
Allergen Corn, IgE: <0.1 kU/L
Beef IgE: <0.1 kU/L
Chocolate/Cacao IgE: <0.1 kU/L
Egg, Whole IgE: <0.1 kU/L
Food Mix (Seafoods) IgE: NEGATIVE
Milk IgE: <0.1 kU/L
Peanut IgE: <0.1 kU/L
Pork IgE: <0.1 kU/L
Soybean IgE: <0.1 kU/L
Wheat IgE: <0.1 kU/L

## 2020-03-29 LAB — CMP14+EGFR
ALT: 13 IU/L (ref 0–32)
AST: 21 IU/L (ref 0–40)
Albumin/Globulin Ratio: 2 (ref 1.2–2.2)
Albumin: 4.4 g/dL (ref 3.7–4.7)
Alkaline Phosphatase: 81 IU/L (ref 44–121)
BUN/Creatinine Ratio: 18 (ref 12–28)
BUN: 16 mg/dL (ref 8–27)
Bilirubin Total: 0.3 mg/dL (ref 0.0–1.2)
CO2: 22 mmol/L (ref 20–29)
Calcium: 10.3 mg/dL (ref 8.7–10.3)
Chloride: 102 mmol/L (ref 96–106)
Creatinine, Ser: 0.9 mg/dL (ref 0.57–1.00)
GFR calc Af Amer: 71 mL/min/{1.73_m2} (ref >59)
GFR calc non Af Amer: 62 mL/min/{1.73_m2} (ref >59)
Globulin, Total: 2.2 g/dL (ref 1.5–4.5)
Glucose: 109 mg/dL — ABNORMAL HIGH (ref 65–99)
Potassium: 4.2 mmol/L (ref 3.5–5.2)
Sodium: 138 mmol/L (ref 134–144)
Total Protein: 6.6 g/dL (ref 6.0–8.5)

## 2020-03-29 LAB — CBC WITH DIFFERENTIAL
Basophils Absolute: 0.1 10*3/uL (ref 0.0–0.2)
Basos: 1 %
EOS (ABSOLUTE): 0.3 10*3/uL (ref 0.0–0.4)
Eos: 4 %
Hematocrit: 40 % (ref 34.0–46.6)
Hemoglobin: 13.4 g/dL (ref 11.1–15.9)
Immature Grans (Abs): 0 10*3/uL (ref 0.0–0.1)
Immature Granulocytes: 0 %
Lymphocytes Absolute: 1.1 10*3/uL (ref 0.7–3.1)
Lymphs: 16 %
MCH: 32.7 pg (ref 26.6–33.0)
MCHC: 33.5 g/dL (ref 31.5–35.7)
MCV: 98 fL — ABNORMAL HIGH (ref 79–97)
Monocytes Absolute: 0.5 10*3/uL (ref 0.1–0.9)
Monocytes: 7 %
Neutrophils Absolute: 4.9 10*3/uL (ref 1.4–7.0)
Neutrophils: 72 %
RBC: 4.1 x10E6/uL (ref 3.77–5.28)
RDW: 11.8 % (ref 11.7–15.4)
WBC: 6.9 10*3/uL (ref 3.4–10.8)

## 2020-03-29 LAB — ALPHA-GAL PANEL
Allergen Lamb IgE: <0.1 kU/L
Beef IgE: <0.1 kU/L
IgE (Immunoglobulin E), Serum: 41 IU/mL (ref 6–495)
O215-IgE Alpha-Gal: <0.1 kU/L
Pork IgE: <0.1 kU/L

## 2020-03-29 LAB — ANTINUCLEAR ANTIBODIES, IFA: ANA Titer 1: POSITIVE — AB

## 2020-03-29 LAB — SEDIMENTATION RATE: Sed Rate: 6 mm/hr (ref 0–40)

## 2020-03-29 LAB — C-REACTIVE PROTEIN: CRP: <1 mg/L (ref 0–10)

## 2020-03-29 LAB — THYROID PANEL
Free Thyroxine Index: 2.1 (ref 1.2–4.9)
T3 Uptake Ratio: 27 % (ref 24–39)
T4, Total: 7.6 ug/dL (ref 4.5–12.0)

## 2020-03-29 LAB — FANA STAINING PATTERNS
Homogeneous Pattern: 1:320 {titer} — ABNORMAL HIGH
Speckled Pattern: 1:80 {titer}

## 2020-03-29 LAB — TRYPTASE: Tryptase: 8.9 ug/L (ref 2.2–13.2)

## 2020-03-31 DIAGNOSIS — R21 Rash and other nonspecific skin eruption: Secondary | ICD-10-CM | POA: Diagnosis not present

## 2020-03-31 DIAGNOSIS — R309 Painful micturition, unspecified: Secondary | ICD-10-CM | POA: Diagnosis not present

## 2020-03-31 DIAGNOSIS — F419 Anxiety disorder, unspecified: Secondary | ICD-10-CM | POA: Diagnosis not present

## 2020-04-01 NOTE — Progress Notes (Signed)
Patient was notified of labs and would like a referral to see Rheumatology. Patient had a rheumatologist in the past but would go with Dr. Ernst Bowler recommendation. Patient is concern since she had issues in the past with this. Will mail out information regarding avoidance measure and patient is currently taking her prednisone which is assisting her at this time regarding latex allergy. Patient did mention she was advise by Dr. Ernst Bowler to have this done is this the next route you want to do. If so then patient would like to have it done the Monday 04/27/2020, follow up on 04/29/2020 and have the final read on 05/01/2020. Please advise?

## 2020-04-02 ENCOUNTER — Telehealth: Payer: Self-pay

## 2020-04-02 NOTE — Telephone Encounter (Signed)
Can you put in a Rheumatology referral for Elevated ANA (ICD-10: R76. 0). Patient would like to see Shaili B. Estanislado Pandy, MD.

## 2020-04-03 NOTE — Telephone Encounter (Signed)
Please see lab results. Referral has been placed to [facility].

## 2020-04-07 ENCOUNTER — Telehealth: Payer: Self-pay

## 2020-04-09 ENCOUNTER — Telehealth: Payer: Self-pay | Admitting: Gastroenterology

## 2020-04-09 ENCOUNTER — Other Ambulatory Visit: Payer: Self-pay | Admitting: Gastroenterology

## 2020-04-09 NOTE — Telephone Encounter (Signed)
Patient called to discuss what the next step with be for her. She she has a lot to discuss with a nurse and requested a call back no other info provided.

## 2020-04-09 NOTE — Telephone Encounter (Signed)
Opened in error

## 2020-04-10 NOTE — Telephone Encounter (Signed)
Patient is due for an office visit and has been scheduled for 06/19/20 2:30

## 2020-04-10 NOTE — Telephone Encounter (Signed)
Attempted to return call.  Her VM is not been set up.

## 2020-04-13 DIAGNOSIS — R109 Unspecified abdominal pain: Secondary | ICD-10-CM | POA: Diagnosis not present

## 2020-04-13 DIAGNOSIS — R300 Dysuria: Secondary | ICD-10-CM | POA: Diagnosis not present

## 2020-04-13 NOTE — Telephone Encounter (Signed)
Patient called back to check on her referral to Dr. Estanislado Pandy. Patient was informed that Dr. Arlean Hopping office will not schedule an appointment until they have all her records from her previous Rheumatologist. Patient will call to get them faxed to Dr. Arlean Hopping office.

## 2020-04-13 NOTE — Telephone Encounter (Signed)
Dr. Jerrell Belfast

## 2020-04-16 ENCOUNTER — Other Ambulatory Visit: Payer: Self-pay | Admitting: Gastroenterology

## 2020-04-19 ENCOUNTER — Other Ambulatory Visit: Payer: Self-pay | Admitting: Gastroenterology

## 2020-04-23 ENCOUNTER — Telehealth: Payer: Self-pay | Admitting: Adult Health

## 2020-04-23 NOTE — Telephone Encounter (Signed)
Called patient who stated she's been on ASA x 2 years, was switched because she  didn't feel well on clopidogrel, thought  her depression was worse. She reported a rash x 14 months, is seeing a  dermatologist, and allergist but no cause found. She forgot to take ASA last night, stated  "blood comes up under the skin on my arms", but today noticed some places were not as big and had a Knot under those areas. She thinks it is because she didn't take ASA last night.   I advised her that those areas on her skin will resolve over time and become smaller. Advised she take ASA. She stated she thinks it is "messing up her stomach" even though she takes pepcid 3 x daily. She wants to switch back to clopidogrel. Advised will discuss with NP. Noted she has not been seen since 12/2018. She stated she only wants to see Dr Jannifer Franklin. I advised will send message to NP as she saw her last. Patient verbalized understanding, appreciation.

## 2020-04-23 NOTE — Telephone Encounter (Signed)
aspirin EC 81 MG tablet Pt feels she may be allergic and is asking for a call to discuss

## 2020-04-24 ENCOUNTER — Telehealth: Payer: Self-pay | Admitting: Gastroenterology

## 2020-04-24 NOTE — Telephone Encounter (Signed)
Spoke with patient, she reports that she is having stomach discomfort, she states that her stomach feels weak and it feels like she may have an ulcer. Patient reports that her last BM was today, she has not taken her Linzess for the past 2 days because she was trying to see if this was causing the discomfort. Patient states that she has been taking Aspirin 81 mg daily due to TIA. Pt reports a loss of appetite and nausea. Patient states that she has taken Levsin as needed, Pepcid three times a day. Offered patient a sooner appt next week with an APP at 1:30 for evaluation, she states that she can't make that appt and would need something late afternoon around 3 pm. Patient states that she has other appts that she has to take care of next week and seeking further recommendations. Please advise, thank you.

## 2020-04-27 NOTE — Telephone Encounter (Signed)
Please send to Dr. Jannifer Franklin

## 2020-04-27 NOTE — Telephone Encounter (Signed)
I called the patient.  The patient is reporting some stomach upset, not sure if is the aspirin or not, she will be seen an allergist soon.  She is on Pepcid, does not want to stop this and this has interaction with Plavix.  She is concerned that her tremors are getting worse over time, she is on very low-dose Diamox which can also upset her stomach but she has been on this for a number of years.  She has no revisit set up, I will try to get a revisit for her.

## 2020-04-27 NOTE — Telephone Encounter (Signed)
I tried to call the patient, unable to reach her, I will call back later.

## 2020-04-28 NOTE — Telephone Encounter (Signed)
FYI: Pt called to schedule an appt. Informed Pt of previous note to schedule a 7:30a appt. Pt stated, prefer an afternoon appt, can not get up that early.  Pt scheduled 10/13 at 3:30p and requested to be put on the waiting list.

## 2020-04-28 NOTE — Telephone Encounter (Signed)
I called patient.  No answer, VM not set up yet.  If patient calls back please offer her an office visit with Dr. Jannifer Franklin.  May use a 7:30 AM spot.

## 2020-04-29 ENCOUNTER — Ambulatory Visit: Payer: Medicare Other | Admitting: Allergy & Immunology

## 2020-04-30 DIAGNOSIS — N3941 Urge incontinence: Secondary | ICD-10-CM | POA: Diagnosis not present

## 2020-04-30 DIAGNOSIS — R102 Pelvic and perineal pain: Secondary | ICD-10-CM | POA: Diagnosis not present

## 2020-05-05 ENCOUNTER — Other Ambulatory Visit: Payer: Self-pay | Admitting: Gastroenterology

## 2020-05-13 ENCOUNTER — Other Ambulatory Visit: Payer: Self-pay

## 2020-05-13 ENCOUNTER — Ambulatory Visit (INDEPENDENT_AMBULATORY_CARE_PROVIDER_SITE_OTHER): Payer: Medicare Other | Admitting: Allergy & Immunology

## 2020-05-13 ENCOUNTER — Encounter: Payer: Self-pay | Admitting: Allergy & Immunology

## 2020-05-13 VITALS — BP 102/64 | HR 77 | Temp 98.1°F | Resp 14 | Ht 64.0 in | Wt 134.4 lb

## 2020-05-13 DIAGNOSIS — L508 Other urticaria: Secondary | ICD-10-CM | POA: Diagnosis not present

## 2020-05-13 DIAGNOSIS — K9049 Malabsorption due to intolerance, not elsewhere classified: Secondary | ICD-10-CM

## 2020-05-13 DIAGNOSIS — J3089 Other allergic rhinitis: Secondary | ICD-10-CM

## 2020-05-13 DIAGNOSIS — J302 Other seasonal allergic rhinitis: Secondary | ICD-10-CM | POA: Diagnosis not present

## 2020-05-13 NOTE — Patient Instructions (Addendum)
1. Rash - with testing positive to cat, dog, grasses, weeds, and trees - Since you cannot tolerate the antihistamines, we are going to advance care to Xolair. - We will submit for approval for the Xolair. - Tammy, our Biologics Coordinator, will call you to discuss the approval process. - Come back on Friday for a sample of the Xolaiir so we can get this started. - I would call Dr. Ruthy Dick office and make an appointment for a follow up appointment to discuss your lab work and new onset problems since the last time that you saw her.  - We will try to avoid additional prednisone courses.  - We are going to get the records from Dr. Ubaldo Glassing and Dr. Ronnald Ramp.   2. Return in about 4 weeks (around 06/10/2020).    Please inform us of any Emergency Department visits, hospitalizations, or changes in symptoms. Call us before going to the ED for breathing or allergy symptoms since we might be able to fit you in for a sick visit. Feel free to contact us anytime with any questions, problems, or concerns.  It was a pleasure to see you again today!  Websites that have reliable patient information: 1. American Academy of Asthma, Allergy, and Immunology: www.aaaai.org 2. Food Allergy Research and Education (FARE): foodallergy.org 3. Mothers of Asthmatics: http://www.asthmacommunitynetwork.org 4. American College of Allergy, Asthma, and Immunology: www.acaai.org   COVID-19 Vaccine Information can be found at: ShippingScam.[email or web address].uk For questions related to vaccine distribution or appointments, please email [email or web address] or call [phone].   We realize that you might be concerned about having an allergic reaction to the COVID19 vaccines. To help with that concern, WE ARE OFFERING THE COVID19 VACCINES IN OUR OFFICE! Ask the front desk for dates!     "Like" [place] on Facebook and Instagram for our latest updates!      A healthy democracy  works best when [employer] participate! Make sure you are registered to vote! If you have moved or changed any of your contact information, you will need to get this updated before voting!  In some cases, you MAY be able to register to vote online: [email or web address]

## 2020-05-13 NOTE — Progress Notes (Signed)
FOLLOW UP  Date of Service/Encounter:  05/13/20   Assessment:   Rash - urticarial today and unable to tolerate antihistamines due to side effects   Reported hypereosinophilia - normal per the last CBC  Adverse food reaction - with testing only positive to watermelon  Chronic rhinitis (cat, dog, grasses, weeds, and trees)   [name] presents for a follow-up of her urticaria.  She continues to have a urticaria over the majority of her body.  Her antihistamine regimen that was started at the last visit has helped.  Unfortunately, we have not been able to figure out a trigger of her symptoms.  She is not interested in additional prednisone courses.  She is rather upset with the rheumatologist to refuse her referral, but she will call Dr. Cathey Endow office back to reschedule an appointment with them since she is already established there.  In the interim, we are going to start her on Xolair for better control of her symptoms.  She is coming back in 2 days to receive her sample.   Plan/Recommendations:   1. Rash - with testing positive to cat, dog, grasses, weeds, and trees - Since you cannot tolerate the antihistamines, we are going to advance care to Xolair. - We will submit for approval for the Xolair. - Tammy, our Biologics Coordinator, will call you to discuss the approval process. - Come back on Friday for a sample of the Xolaiir so we can get this started. - I would call Dr. Ruthy Dick office and make an appointment for a follow up appointment to discuss your lab work and new onset problems since the last time that you saw her.  - We will try to avoid additional prednisone courses.  - We are going to get the records from Dr. Ubaldo Glassing and Dr. Ronnald Ramp.   2. Return in about 4 weeks (around 06/10/2020).   Subjective:   [name] is a 80 y.o. female presenting today for follow up of  Chief Complaint  Patient presents with  . Rash    Says it came back harder and spreading.  Antihistamines cause depressions and suicidal issues    [name] has a history of the following: Patient Active Problem List   Diagnosis Date Noted  . Oral thrush 11/13/2017  . Constipation 06/05/2014  . Generalized abdominal cramping 06/05/2014  . Dyspnea 05/24/2012  . Essential tremor 05/17/2012  . Memory loss 05/17/2012  . Migraine headache 03/04/2012  . Chest pain 03/03/2012  . Fibromyalgia   . Hypertension   . Hyperlipidemia   . GERD (gastroesophageal reflux disease)   . Asthma   . Depression   . Diverticulosis   . ANXIETY DEPRESSION 12/08/2009  . GERD 12/08/2009  . COLONIC POLYPS, ADENOMATOUS, HX OF 12/08/2009    History obtained from: chart review and patient.  [name] is a 80 y.o. female presenting for a sick visit.  She was last seen in February 2022 as a new patient.  At that time, we did testing to the environmental allergy panel that was positive to trees and cat only.  We did the entire food panel which was positive only for watermelon.  We obtained labs to look for serious causes of hives.  This was notable for an elevated ANA.  We referred her to see Dr. Estanislado Pandy, but her referral was denied.  We also did patch testing.  We discussed starting Dupixent as a means of controlling her rash, which vacillated between looking like urticaria and eczema.  We also wanted to obtain some outside records to see her biopsy results.  Environmental allergy panel via the blood was positive to cat, dog, grasses, trees, and weeds.  She did have an elevated ANA with a titer of 1:320. She had a homogenous pattern consistent with SLE.  She has not been using her antihistamines at all as it causes depression for her. She reports SI with the use of cetirizine. Benadryl makes her memory problems even worse. She also feels terrible with the Allegra.  She is wondering if there is anything besides the antihistamines it can be used to help control her symptoms.  She does bring up the  idea of a latex allergy.  She knows that she is sensitive to latex.  The only new recent addition to her environment as a memory foam mattress.  However, she is around the label and it does not contain latex.  She had discussed doing latex testing at some point.  Her hives over her entire body.  She does show me multiple areas of her body.  She does not look all the different from when I first saw her, unfortunately.  However, this is likely because she is not able to tolerate any of the antihistamines.  She unfortunately was not able to see Dr. Estanislado Pandy.  I would assume that this is because she is already followed by Dr. Trudie Reed.  She did not have any problems with her previous rheumatologist, she just wanted another opinion.  Her last appointment with her rheumatologist was less than 3 years ago.  Evidently she has had a biopsy performed by Dr. Jarome Matin.  She does not have any records from this.  Otherwise, there have been no changes to her past medical history, surgical history, family history, or social history.    Review of Systems  Constitutional: Negative.  Negative for chills, fever, malaise/fatigue and weight loss.  HENT: Negative.  Negative for congestion, ear discharge, ear pain and sore throat.   Eyes: Negative for pain, discharge and redness.  Respiratory: Negative for cough, sputum production, shortness of breath and wheezing.   Cardiovascular: Negative.  Negative for chest pain and palpitations.  Gastrointestinal: Negative for abdominal pain, constipation, diarrhea, heartburn, nausea and vomiting.  Skin: Positive for itching and rash.  Neurological: Negative for dizziness and headaches.  Endo/Heme/Allergies: Negative for environmental allergies. Does not bruise/bleed easily.       Objective:   Blood pressure 102/64, pulse 77, temperature 98.1 F (36.7 C), resp. rate 14, height 5\' 4"  (1.626 m), weight 134 lb 6.4 oz (61 kg), SpO2 97 %. Body mass index is 23.07  kg/m.   Physical Exam:  Physical Exam Constitutional:      Appearance: She is well-developed.     Comments: Pleasant but clearly frazzled by her hives.  HENT:     Head: Normocephalic and atraumatic.     Right Ear: Tympanic membrane, ear canal and external ear normal.     Left Ear: Tympanic membrane, ear canal and external ear normal.     Nose: No nasal deformity, septal deviation, mucosal edema or rhinorrhea.     Right Turbinates: Enlarged and swollen.     Left Turbinates: Enlarged and swollen.     Right Sinus: No maxillary sinus tenderness or frontal sinus tenderness.     Left Sinus: No maxillary sinus tenderness or frontal sinus tenderness.     Mouth/Throat:     Mouth: Mucous membranes are not pale and not dry.  Pharynx: Uvula midline.  Eyes:     General:        Right eye: No discharge.        Left eye: No discharge.     Conjunctiva/sclera: Conjunctivae normal.     Right eye: Right conjunctiva is not injected. No chemosis.    Left eye: Left conjunctiva is not injected. No chemosis.    Pupils: Pupils are equal, round, and reactive to light.  Cardiovascular:     Rate and Rhythm: Normal rate and regular rhythm.     Heart sounds: Normal heart sounds.  Pulmonary:     Effort: Pulmonary effort is normal. No tachypnea, accessory muscle usage or respiratory distress.     Breath sounds: Normal breath sounds. No wheezing, rhonchi or rales.     Comments: Moving air well in all lung fields. Chest:     Chest wall: No tenderness.  Lymphadenopathy:     Cervical: No cervical adenopathy.  Skin:    General: Skin is warm.     Capillary Refill: Capillary refill takes less than 2 seconds.     Coloration: Skin is not pale.     Findings: Rash present. No abrasion, erythema or petechiae. Rash is urticarial. Rash is not papular or vesicular.     Comments: Multiple urticarial lesions over her upper extremities and lower extremities.  There was more of an eczematous left to her rash last time  I saw her, but all that is there today are clearly urticaria.  Neurological:     Mental Status: She is alert.      Diagnostic studies: none        Salvatore Marvel, MD  [facility]

## 2020-05-14 ENCOUNTER — Telehealth: Payer: Self-pay | Admitting: *Deleted

## 2020-05-14 DIAGNOSIS — L501 Idiopathic urticaria: Secondary | ICD-10-CM | POA: Diagnosis not present

## 2020-05-14 NOTE — Telephone Encounter (Signed)
Called patient and advised buy and bill 300mg  every 28 days and how MCR and supplement will pay for Xolair

## 2020-05-14 NOTE — Telephone Encounter (Signed)
-----   Message from Valentina Shaggy, MD sent at 04/07/2020  6:51 AM EST ----- I think I would rather go with dupi for her since it seemed more eczematous. I am still awaiting a biopsy report that might throw some more light to the situation.  Salvatore Marvel, MD [facility] [place]  ----- Message ----- From: Carin Hock, [place] Sent: 04/01/2020   3:45 PM EST To: Valentina Shaggy, MD  You had sent me a message about Xolair or Dupixent.  We can try her on Xolair if ok with you she is buy and bill and will not require auth but for Dupi will need to go thru patient assistance. Yuji Walth  ----- Message ----- From: Valentina Shaggy, MD Sent: 03/26/2020  11:15 PM EST To: Carin Hock, CMA  We could also try Xolair. Some of her lesions appeared more urticarial. She is also getting patch testing done at some point.

## 2020-05-15 ENCOUNTER — Ambulatory Visit (INDEPENDENT_AMBULATORY_CARE_PROVIDER_SITE_OTHER): Payer: Medicare Other

## 2020-05-15 ENCOUNTER — Other Ambulatory Visit: Payer: Self-pay

## 2020-05-15 DIAGNOSIS — L501 Idiopathic urticaria: Secondary | ICD-10-CM

## 2020-05-15 MED ORDER — EPINEPHRINE 0.3 MG/0.3ML IJ SOAJ: 0.3000 mg | Freq: Once | INTRAMUSCULAR | 0 refills | Status: AC

## 2020-05-15 MED ORDER — OMALIZUMAB 150 MG ~~LOC~~ SOLR
300.0000 mg | SUBCUTANEOUS | Status: AC
  Administered 2020-05-15 – 2020-10-09 (×5): 300 mg via SUBCUTANEOUS

## 2020-05-15 NOTE — Progress Notes (Signed)
Immunotherapy   Patient Details  Name: [name] MRN: [id number] Date of Birth: July 29, 1940  05/15/2020  [name] started xolair injections and waited in the office for 45 minutes without any reactions. Following schedule: Xolair Frequency:Every 28 days Epi-Pen:Yes  Consent signed and patient instructions given.   [name] 05/15/2020, 4:41 PM

## 2020-05-18 ENCOUNTER — Encounter: Payer: Self-pay | Admitting: Allergy & Immunology

## 2020-05-20 ENCOUNTER — Ambulatory Visit: Payer: Medicare Other | Admitting: Family

## 2020-05-20 DIAGNOSIS — N3001 Acute cystitis with hematuria: Secondary | ICD-10-CM | POA: Diagnosis not present

## 2020-05-25 ENCOUNTER — Other Ambulatory Visit: Payer: Self-pay | Admitting: Geriatric Medicine

## 2020-05-25 ENCOUNTER — Ambulatory Visit
Admission: RE | Admit: 2020-05-25 | Discharge: 2020-05-25 | Disposition: A | Discharge status: Home / Self Care | Payer: Medicare Other | Source: Ambulatory Visit | Attending: Geriatric Medicine | Admitting: Geriatric Medicine

## 2020-05-25 DIAGNOSIS — F331 Major depressive disorder, recurrent, moderate: Secondary | ICD-10-CM | POA: Diagnosis not present

## 2020-05-25 DIAGNOSIS — N1831 Chronic kidney disease, stage 3a: Secondary | ICD-10-CM

## 2020-05-25 DIAGNOSIS — I129 Hypertensive chronic kidney disease with stage 1 through stage 4 chronic kidney disease, or unspecified chronic kidney disease: Secondary | ICD-10-CM | POA: Diagnosis not present

## 2020-05-25 DIAGNOSIS — Z79899 Other long term (current) drug therapy: Secondary | ICD-10-CM | POA: Diagnosis not present

## 2020-05-25 DIAGNOSIS — R0609 Other forms of dyspnea: Secondary | ICD-10-CM

## 2020-05-25 DIAGNOSIS — R0600 Dyspnea, unspecified: Secondary | ICD-10-CM

## 2020-05-25 DIAGNOSIS — R413 Other amnesia: Secondary | ICD-10-CM | POA: Diagnosis not present

## 2020-05-25 DIAGNOSIS — L508 Other urticaria: Secondary | ICD-10-CM | POA: Diagnosis not present

## 2020-05-28 NOTE — Progress Notes (Signed)
We did receive her notes from [facility].  The last visit we have is November 2021.  At that time, her triamcinolone was increased to 3 times daily.  She was switched to [facility] sensitive skin.  They coated the rash as dermatitis and I will ask.  It seems that she did receive IM Kenalog a few times.  She was also prescribed betamethasone cream.  She has been treated with permethrin cream on 2 occasions.  She was also given halobetasol at one point.  Ice packs were recommended.  She has been given clobetasol as well as hydrocortisone.  She did have some lesions scraped immune 2017 that were negative for varicella as well as HSV.  She had a punch biopsy in May 2010 that showed extensive dermal sclerosis.  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  The relevant sheets will be scanned into her chart.

## 2020-06-01 ENCOUNTER — Other Ambulatory Visit: Payer: Self-pay | Admitting: Gastroenterology

## 2020-06-11 DIAGNOSIS — L501 Idiopathic urticaria: Secondary | ICD-10-CM | POA: Diagnosis not present

## 2020-06-11 DIAGNOSIS — R21 Rash and other nonspecific skin eruption: Secondary | ICD-10-CM | POA: Diagnosis not present

## 2020-06-11 DIAGNOSIS — D692 Other nonthrombocytopenic purpura: Secondary | ICD-10-CM | POA: Diagnosis not present

## 2020-06-12 ENCOUNTER — Other Ambulatory Visit: Payer: Self-pay

## 2020-06-12 ENCOUNTER — Encounter: Payer: Self-pay | Admitting: Allergy & Immunology

## 2020-06-12 ENCOUNTER — Ambulatory Visit (INDEPENDENT_AMBULATORY_CARE_PROVIDER_SITE_OTHER): Payer: Medicare Other | Admitting: Allergy & Immunology

## 2020-06-12 VITALS — BP 140/72 | HR 82 | Temp 98.4°F | Resp 14 | Ht 64.0 in | Wt 135.4 lb

## 2020-06-12 DIAGNOSIS — R21 Rash and other nonspecific skin eruption: Secondary | ICD-10-CM

## 2020-06-12 DIAGNOSIS — D692 Other nonthrombocytopenic purpura: Secondary | ICD-10-CM | POA: Diagnosis not present

## 2020-06-12 DIAGNOSIS — L501 Idiopathic urticaria: Secondary | ICD-10-CM | POA: Diagnosis not present

## 2020-06-12 MED ORDER — TRIAMCINOLONE ACETONIDE 0.025 % EX OINT: 1.0000 "application " | TOPICAL_OINTMENT | Freq: Two times a day (BID) | CUTANEOUS | 6 refills | Status: AC

## 2020-06-12 NOTE — Patient Instructions (Addendum)
1. Rash - with testing positive to cat, dog, grasses, weeds, and trees - It seems that the Xolair is working well to control your symptoms. - We are going to continue with this since it has worked so well. - We will get with [facility] to change the coding to get the lab covered better.  - Try adding on a lower strength triamcinolone 0.025% on the upper chest to help with itching there. - Continue with Allegra nightly.   2. Concern for parasitic infection - We are going to get some blood work to look for evidence of this.  - We can send in ivermectin if this is positive.   3. Return in about 3 months (around 09/11/2020).    Please inform us of any Emergency Department visits, hospitalizations, or changes in symptoms. Call us before going to the ED for breathing or allergy symptoms since we might be able to fit you in for a sick visit. Feel free to contact us anytime with any questions, problems, or concerns.  It was a pleasure to see you again today!  Websites that have reliable patient information: 1. American Academy of Asthma, Allergy, and Immunology: www.aaaai.org 2. Food Allergy Research and Education (FARE): foodallergy.org 3. Mothers of Asthmatics: http://www.asthmacommunitynetwork.org 4. American College of Allergy, Asthma, and Immunology: www.acaai.org   COVID-19 Vaccine Information can be found at: ShippingScam.co.uk For questions related to vaccine distribution or appointments, please email [email or web address] or call [phone].   We realize that you might be concerned about having an allergic reaction to the COVID19 vaccines. To help with that concern, WE ARE OFFERING THE COVID19 VACCINES IN OUR OFFICE! Ask the front desk for dates!     "Like" [place] on Facebook and Instagram for our latest updates!      A healthy democracy works best when [employer] participate! Make sure you are registered to vote! If you have  moved or changed any of your contact information, you will need to get this updated before voting!  In some cases, you MAY be able to register to vote online: [email or web address]

## 2020-06-12 NOTE — Progress Notes (Signed)
FOLLOW UP  Date of Service/Encounter:  06/12/20   Assessment:   Rash - with improvement in urticaria since starting the Xolair  Reported hypereosinophilia - normal per the last CBC  Adverse food reaction - with testing only positive to watermelon  Chronic rhinitis (cat, dog, grasses, weeds, and trees)   Plan/Recommendations:   1. Rash - with testing positive to cat, dog, grasses, weeds, and trees - It seems that the Xolair is working well to control your symptoms. - We are going to continue with this since it has worked so well. - We will get with [facility] to change the coding to get the lab covered better.  - Try adding on a lower strength triamcinolone 0.025% on the upper chest to help with itching there. - Continue with Allegra nightly.   2. Concern for parasitic infection - We are going to get some blood work to look for evidence of this.  - We can send in ivermectin if this is positive.   3. Return in about 3 months (around 09/11/2020).   Subjective:   [name] Choi is a 80 y.o. female presenting today for follow up of  Chief Complaint  Patient presents with  . Chronic urticaria    [name] Choi has a history of the following: Patient Active Problem List   Diagnosis Date Noted  . Oral thrush 11/13/2017  . Constipation 06/05/2014  . Generalized abdominal cramping 06/05/2014  . Dyspnea 05/24/2012  . Essential tremor 05/17/2012  . Memory loss 05/17/2012  . Migraine headache 03/04/2012  . Chest pain 03/03/2012  . Fibromyalgia   . Hypertension   . Hyperlipidemia   . GERD (gastroesophageal reflux disease)   . Asthma   . Depression   . Diverticulosis   . ANXIETY DEPRESSION 12/08/2009  . GERD 12/08/2009  . COLONIC POLYPS, ADENOMATOUS, HX OF 12/08/2009    History obtained from: chart review and patient.  [name] Choi is a 80 y.o. female presenting for a follow up visit.  She was last seen in March 2022.  At that time, she continued to have an  urticarial rash.  We had started her on antihistamines which she has was unable to tolerate due to side effects.  I also referred her to rheumatology, but the rheumatologist declined the referral.  She was pretty upset about that until last visit complaining about it.    She did finally make a decision to start Xolair and only to avoid prednisone.  She received her second dose today.  Since the last visit, she has done well. She reports  That she felt better after the first dose of the Xolair. Hives have all but resolved with the Xolair. She has not had any issues with the injections and feels that they have helped.  She does report that she has "tracking" on her arms. She continuously refers to this as tracking, although she does not use IV drugs. It appears to be more purpura or petechiae rather than tracking.   She is going to see Dr. Lenna Gilford again. They did decide to pick her up again. She has not heard from them again, but she is going to call back to schedule that. She has been followed in the past by her for management of her fibromyalgia. She uses some topical ointments to help with muscle aches.   She continues to experience a lot of stress with regards to her grandson who fleeces money from her and has a lot of anger issues. Her grandson  is apparently living rent free in [place] mother's home and not keeping up the place.   Otherwise, there have been no changes to her past medical history, surgical history, family history, or social history.    Review of Systems  Constitutional: Negative.  Negative for chills, fever, malaise/fatigue and weight loss.  HENT: Negative.  Negative for congestion, ear discharge and ear pain.   Eyes: Negative for pain, discharge and redness.  Respiratory: Negative for cough, sputum production, shortness of breath and wheezing.   Cardiovascular: Negative.  Negative for chest pain and palpitations.  Gastrointestinal: Negative for abdominal pain, constipation,  diarrhea, heartburn, nausea and vomiting.  Skin: Positive for itching and rash.  Neurological: Negative for dizziness and headaches.  Endo/Heme/Allergies: Negative for environmental allergies. Does not bruise/bleed easily.       Objective:   Blood pressure 140/72, pulse 82, temperature 98.4 F (36.9 C), temperature source Temporal, resp. rate 14, height 5\' 4"  (1.626 m), weight 135 lb 6.4 oz (61.4 kg), SpO2 95 %. Body mass index is 23.24 kg/m.   Physical Exam:  Physical Exam Constitutional:      Appearance: She is well-developed.  HENT:     Head: Normocephalic and atraumatic.     Right Ear: Tympanic membrane, ear canal and external ear normal.     Left Ear: Tympanic membrane and ear canal normal.     Nose: No nasal deformity, septal deviation, mucosal edema or rhinorrhea.     Right Sinus: No maxillary sinus tenderness or frontal sinus tenderness.     Left Sinus: No maxillary sinus tenderness or frontal sinus tenderness.     Mouth/Throat:     Mouth: Mucous membranes are not pale and not dry.     Pharynx: Uvula midline.  Eyes:     General:        Right eye: No discharge.        Left eye: No discharge.     Conjunctiva/sclera: Conjunctivae normal.     Right eye: Right conjunctiva is not injected. No chemosis.    Left eye: Left conjunctiva is not injected. No chemosis.    Pupils: Pupils are equal, round, and reactive to light.  Cardiovascular:     Rate and Rhythm: Normal rate and regular rhythm.     Heart sounds: Normal heart sounds.  Pulmonary:     Effort: Pulmonary effort is normal. No tachypnea, accessory muscle usage or respiratory distress.     Breath sounds: Normal breath sounds. No wheezing, rhonchi or rales.  Chest:     Chest wall: No tenderness.  Lymphadenopathy:     Cervical: No cervical adenopathy.  Skin:    General: Skin is warm.     Capillary Refill: Capillary refill takes less than 2 seconds.     Coloration: Skin is not pale.     Findings: Rash present. No  abrasion, erythema or petechiae. Rash is purpuric and urticarial. Rash is not papular or vesicular.     Comments: She does have excoriation lesions on her bilateral arms, especially near the wrist.  She also has what appears to be non-blanchable petechiae vs purpura on her arms.   Neurological:     Mental Status: She is alert.      Diagnostic studies: none  Xolair #2 given in the clinic room today. Patient tolerated this well.      Salvatore Marvel, MD  [facility]

## 2020-06-13 LAB — CBC WITH DIFFERENTIAL
Basophils Absolute: 0.1 10*3/uL (ref 0.0–0.2)
Basos: 1 %
EOS (ABSOLUTE): 0.2 10*3/uL (ref 0.0–0.4)
Eos: 3 %
Hematocrit: 40.9 % (ref 34.0–46.6)
Hemoglobin: 13.2 g/dL (ref 11.1–15.9)
Immature Grans (Abs): 0 10*3/uL (ref 0.0–0.1)
Immature Granulocytes: 0 %
Lymphocytes Absolute: 0.8 10*3/uL (ref 0.7–3.1)
Lymphs: 10 %
MCH: 32 pg (ref 26.6–33.0)
MCHC: 32.3 g/dL (ref 31.5–35.7)
MCV: 99 fL — ABNORMAL HIGH (ref 79–97)
Monocytes Absolute: 0.5 10*3/uL (ref 0.1–0.9)
Monocytes: 6 %
Neutrophils Absolute: 6.6 10*3/uL (ref 1.4–7.0)
Neutrophils: 80 %
RBC: 4.12 x10E6/uL (ref 3.77–5.28)
RDW: 11.3 % — ABNORMAL LOW (ref 11.7–15.4)
WBC: 8.1 10*3/uL (ref 3.4–10.8)

## 2020-06-14 DIAGNOSIS — N3001 Acute cystitis with hematuria: Secondary | ICD-10-CM | POA: Diagnosis not present

## 2020-06-14 DIAGNOSIS — R300 Dysuria: Secondary | ICD-10-CM | POA: Diagnosis not present

## 2020-06-17 LAB — STRONGYLOIDES, AB, IGG: Strongyloides, Ab, IgG: NEGATIVE

## 2020-06-18 LAB — LYME DISEASE, WESTERN BLOT
IgG P18 Ab.: ABSENT
IgG P23 Ab.: ABSENT
IgG P28 Ab.: ABSENT
IgG P30 Ab.: ABSENT
IgG P39 Ab.: ABSENT
IgG P41 Ab.: ABSENT
IgG P45 Ab.: ABSENT
IgG P58 Ab.: ABSENT
IgG P66 Ab.: ABSENT
IgG P93 Ab.: ABSENT
Lyme IgG Wb: NEGATIVE
Lyme IgM Wb: POSITIVE — AB

## 2020-06-18 LAB — ROCKY MTN SPOTTED FVR ABS PNL(IGG+IGM)
RMSF IgG: NEGATIVE
RMSF IgM: 0.46 index (ref 0.00–0.89)

## 2020-06-19 ENCOUNTER — Encounter: Payer: Self-pay | Admitting: Gastroenterology

## 2020-06-19 ENCOUNTER — Ambulatory Visit (INDEPENDENT_AMBULATORY_CARE_PROVIDER_SITE_OTHER): Payer: Medicare Other | Admitting: Gastroenterology

## 2020-06-19 VITALS — BP 108/72 | HR 71 | Ht 64.0 in | Wt 136.0 lb

## 2020-06-19 DIAGNOSIS — K582 Mixed irritable bowel syndrome: Secondary | ICD-10-CM

## 2020-06-19 DIAGNOSIS — K862 Cyst of pancreas: Secondary | ICD-10-CM

## 2020-06-19 DIAGNOSIS — R21 Rash and other nonspecific skin eruption: Secondary | ICD-10-CM

## 2020-06-19 NOTE — Progress Notes (Addendum)
[name]    [name]    [birth year]  Primary Care Physician:Stoneking, Christiane Ha, MD  Referring Physician: Lajean Manes, MD [address] [place],  [place] [address]   Chief complaint:  GERD,   HPI:   80 year old female here for follow-up visit for irritable bowel syndrome, constipation and abdominal pain.  Last office visit in May 2021 with complaints of generalized abdominal pain and irregular bowel habits with constipation  Continues to have irregular bowel habits, no longer taking Linzess because it gave her diarrhea.  She continues to have generalized abdominal pain and cramping.  She has upper abdominal pain with excess gas, she also feels gurgling in her throat Is currently having multiple soft bowel movements per day and is not on any laxatives.  She has history of chronic UTI.  She was recommended to do CT abdomen pelvis with contrast in 2021 for further evaluation of abdominal pain and also has history of pancreatic cyst  Patient did not do the CT because she was worried about radiation and also insistent that she has stage IIIa chronic kidney disease I reassured patient that her kidney function is within normal range based on most recent labs and she does not have stage IIIa chronic kidney disease, recommended her to discuss with Dr. Felipa Eth  She is on and off on antibiotics.  She is having a rash on her abdomen has tried topical steroids with some improvement but complains of itching and burning sensation.  She thinks it may be secondary to diapers.  She wears multiple layers of underwear.  He has seen dermatology and PMD for the rash, is unhappy with what they recommended.  She feels they have not been treating her well  Patient transferred from [place] because she was unhappy with the care there. She has had extensive GI work-up in the past by Dr. Cristina Gong at [place] and here   EGD October 2017 was normal. colonoscopy  October 2017 2 small diminutive tubular adenomas were removed, sigmoid diverticulitis and small internal hemorrhoids   GI workup in the past which includes HIDA scan that was normal with ejection fraction of 80%.  Gastric emptying scan was also normal with 91% emptying in 2 hours.  upper endoscopy by Dr. Cristina Gong in July 2012 was normal.  Colonoscopy in July 2012 was also normal.  Recommended recall in 5 years Prior colonoscopy in 2003 with sessile serrated polyp that was removed with biopsy    Outpatient Encounter Medications as of 06/19/2020  Medication Sig  . acetaZOLAMIDE (DIAMOX) 125 MG tablet TAKE 1 TABLET (125 MG TOTAL) BY MOUTH DAILY.  Marland Kitchen albuterol (VENTOLIN HFA) 108 (90 Base) MCG/ACT inhaler TAKE 2 PUFFS BY MOUTH EVERY 6 HOURS AS NEEDED FOR WHEEZE OR SHORTNESS OF BREATH  . aspirin EC 81 MG tablet Take 81 mg by mouth daily.  Marland Kitchen bismuth subsalicylate (PEPTO BISMOL) 262 MG/15ML suspension Take 30 mLs by mouth every 6 (six) hours as needed.  . cyanocobalamin 100 MCG tablet Take 100 mcg by mouth daily.  . DULoxetine (CYMBALTA) 30 MG capsule Take 30 mg by mouth daily.  . ergocalciferol (VITAMIN D2) 50000 UNITS capsule Take 50,000 Units by mouth once a week.  . famotidine (PEPCID) 20 MG tablet TAKE 1 TABLET 3 TIMES DAILY (BEFORE BREAKFAST AND DINNER AND ANOTHER ONE BEFORE BEDTIME)  . HYDROcodone-acetaminophen (NORCO) 7.5-325 MG per tablet Take 1 tablet by mouth 2 (two) times daily. For pain  .  hyoscyamine (LEVSIN SL) 0.125 MG SL tablet DISSOLVE 1 TABLET UNDER THE TONGUE EVERY 6 HOURS AS NEEDED  . ipratropium (ATROVENT) 0.06 % nasal spray Place into both nostrils as needed.  Marland Kitchen levothyroxine (SYNTHROID, LEVOTHROID) 50 MCG tablet Take 50 mcg by mouth daily before breakfast. Once daily  . LORazepam (ATIVAN) 1 MG tablet Take 1 tablet by mouth at bedtime.  Marland Kitchen losartan (COZAAR) 100 MG tablet Take 100 mg by mouth daily.  . SUMAtriptan (IMITREX) 50 MG tablet Take 50 mg by mouth every 2 (two) hours as  needed. For migraine  . triamcinolone (KENALOG) 0.025 % ointment Apply 1 application topically 2 (two) times daily.  . [DISCONTINUED] Aspirin-Calcium Carbonate (BAYER WOMENS) [phone] MG TABS Women's Aspirin with Calcium 81 mg-300 mg calcium (777 mg) tablet   81 mg by oral route.  . [DISCONTINUED] docusate sodium (COLACE) 250 MG capsule Take 250 mg by mouth daily.  . [DISCONTINUED] LINZESS 145 MCG CAPS capsule TAKE 1 CAPSULE BY MOUTH ONCE DAILY BEFORE BREAKFAST  . [DISCONTINUED] predniSONE (DELTASONE) 10 MG tablet Take 3 tabs (30mg ) twice daily for 3 days, then 2 tabs (20mg ) twice daily for 3 days, then 1 tab (10mg ) twice daily for 3 days, then STOP.   Facility-Administered Encounter Medications as of 06/19/2020  Medication  . omalizumab Arvid Right) injection 300 mg    Allergies as of 06/19/2020 - Review Complete 06/19/2020  Allergen Reaction Noted  . Crestor [rosuvastatin calcium]  11/17/2015  . Zocor [simvastatin]  11/17/2015  . Enalapril  11/17/2015  . Lamictal [lamotrigine]  11/17/2015  . Nitrofuran derivatives Nausea Only 12/29/2014  . Erythromycin Other (See Comments)   . Methylprednisolone  12/20/2018  . Nystatin Other (See Comments) 11/17/2017  . Vivelle [estradiol]  11/17/2015  . Zetia [ezetimibe]  07/24/2017  . Ciprofloxacin Nausea Only 12/12/2011  . Ciprofloxacin hcl  11/17/2015  . Latex Rash 03/03/2012  . Shellfish allergy Rash 03/03/2012  . Sulfa antibiotics Nausea Only 12/12/2011  . Sulfasalazine Nausea Only 12/12/2011    Past Medical History:  Diagnosis Date  . ADD (attention deficit disorder)   . Anxiety   . Arthritis   . Asthma   . Chronic kidney disease    CKD Stage 3  . Chronic renal insufficiency, stage III (moderate) (HCC)   . Depression    major depression  . Dermatitis   . Diverticulosis   . Essential tremor 05/17/2012  . Fibromyalgia   . GERD (gastroesophageal reflux disease)   . Hyperlipidemia   . Hypertension   . Memory loss 05/17/2012  . Migraines    . Occasional tremors   . Pneumonia   . TIA (transient ischemic attack)    History of 2 TIA's per pt    Past Surgical History:  Procedure Laterality Date  . ABDOMINAL HYSTERECTOMY    . APPENDECTOMY    . CATARACT EXTRACTION Bilateral   . TONSILLECTOMY AND ADENOIDECTOMY     age 13  . TOTAL HIP ARTHROPLASTY Bilateral 2009   Dr Adriana Mccallum    Family History  Problem Relation Age of Onset  . Liver cancer Mother   . Pulmonary embolism Mother   . Prostate cancer Father   . Stroke Maternal Grandmother   . Heart failure Maternal Grandfather   . Emphysema Maternal Grandfather   . Congestive Heart Failure Maternal Grandfather   . Coronary artery disease Paternal Grandmother   . Coronary artery disease Paternal Grandfather   . Prostate cancer Paternal Uncle        x 2  .  ALS Paternal Aunt   . Brain cancer Paternal Aunt   . Breast cancer Paternal Aunt   . Lung cancer Maternal Aunt   . Depression Brother        suicide  . Diabetes Paternal Aunt        x 2  . Breast cancer Cousin   . Colon cancer Neg Hx   . Esophageal cancer Neg Hx   . Rectal cancer Neg Hx   . Stomach cancer Neg Hx     Social History   Socioeconomic History  . Marital status: Married    Spouse name: Not on file  . Number of children: 2  . Years of education: hs  . Highest education level: Not on file  Occupational History  . Occupation: Retired    Fish farm manager: RETIRED  Tobacco Use  . Smoking status: Never Smoker  . Smokeless tobacco: Never Used  Vaping Use  . Vaping Use: Never used  Substance and Sexual Activity  . Alcohol use: No    Alcohol/week: 0.0 standard drinks  . Drug use: No  . Sexual activity: Never  Other Topics Concern  . Not on file  Social History Narrative   Patient lives at home with her husband [name]).    Retired.   Education high school.   Right handed.   Caffeine two cups of coffee daily and one glass of sweet tea.   Social Determinants of Health   Financial Resource  Strain: Not on file  Food Insecurity: Not on file  Transportation Needs: Not on file  Physical Activity: Not on file  Stress: Not on file  Social Connections: Not on file  Intimate Partner Violence: Not on file      Review of systems: All other review of systems negative except as mentioned in the HPI.   Physical Exam: Vitals:   06/19/20 1423  BP: 108/72  Pulse: 71  SpO2: 95%   Body mass index is 23.34 kg/m. Gen:      No acute distress HEENT:  sclera anicteric Abd:      soft, non-tender; no palpable masses, no distension, raised red erythematous rash consistent with contact dermatitis but cannot exclude fungal infection Ext:    No edema Neuro: alert and oriented x 3 Psych: normal mood and affect  Data Reviewed:  Reviewed labs, radiology imaging, old records and pertinent past GI work up   Assessment and Plan/Recommendations: 80 year old female with history of chronic GERD, irritable bowel syndrome with predominant constipation  IBS constipation and diarrhea: Increase dietary fiber and water intake She stopped Linzess because it was causing diarrhea  History of adenomatous colon polyps: Due for surveillance colonoscopy in October 2022  Pancreatic cyst: Will need repeat imaging to further evaluate given her complaints of significant persistent abdominal pain and to make sure the cyst is stable with no other acute pathology.  Patient is hesitant to undergo CT abdomen.  She says she has stage IIIa chronic kidney disease though based on recent labs her GFR has been within normal range.  I tried to reassure patient. Will plan for MRI pancreas protocol instead of CT and reassured patient that there is no radiation involved.  She states that she is aware of MRI, she has had them done previously Advised patient to check with Dr. Felipa Eth if she has persistent kidney disease or if it was acute kidney injury that has since resolved. She was questioning my ability to interpret the  labs or my expertise in the area  Abdominal wall rash: She has seen dermatology and PMD, is unhappy with the recommendation that was provided.  Advised patient to go back to dermatology and check what treatment options are available We will do a trial antifungal cream, miconazole 2% small pea-sized amount twice daily on the affected area for 4 to 6 weeks Avoid excessive moisture, consider changing underwear or diaper to prevent contact dermatitis  GERD: Patient is worried about potential side effects with PPI or H2 blocker Continue with lifestyle modifications and antireflux measures  This visit required >40 minutes of patient care (this includes precharting, chart review, review of results, face-to-face time used for counseling as well as treatment plan and follow-up. The patient was provided an opportunity to ask questions and all were answered. The patient agreed with the plan and demonstrated an understanding of the instructions.  Damaris Hippo , MD    CC: Lajean Manes, MD

## 2020-06-19 NOTE — Patient Instructions (Signed)
You will be contacted by [facility] in the next 2 days to arrange a __________.  The number on your caller ID will be [phone], please answer when they call.  If you have not heard from them in 2 days please call [phone] to schedule.     Due to recent changes in healthcare laws, you may see the results of your imaging and laboratory studies on MyChart before your provider has had a chance to review them.  We understand that in some cases there may be results that are confusing or concerning to you. Not all laboratory results come back in the same time frame and the provider may be waiting for multiple results in order to interpret others.  Please give [place] 48 hours in order for your provider to thoroughly review all the results before contacting the office for clarification of your results.   Thank you for choosing [facility]  Karleen Hampshire Nand[place]am,MD

## 2020-06-22 ENCOUNTER — Telehealth: Payer: Self-pay | Admitting: Gastroenterology

## 2020-06-22 NOTE — Telephone Encounter (Signed)
Pls call pt, she has some questions regarding her last ov with Dr. Silverio Decamp.

## 2020-06-23 ENCOUNTER — Telehealth: Payer: Self-pay

## 2020-06-23 MED ORDER — AMOXICILLIN 500 MG PO CAPS: 500.0000 mg | ORAL_CAPSULE | Freq: Three times a day (TID) | ORAL | 0 refills | Status: DC

## 2020-06-23 NOTE — Telephone Encounter (Signed)
Patient returned call and requested to speak with [facility]. Advised that Eustaquio Maize is out of the office and I am not sure when she will return and I would be glad to assist her. She stated that it was personal and Eustaquio Maize knew her well. Patient requested to speak with the office manager in regards to her concerns. She states that she may have to transfer care to another provider. Patient is aware that Barbera Setters is out of the office today and will follow up with her once she returns. Patient requested that she be called back tomorrow after 2 PM.

## 2020-06-23 NOTE — Telephone Encounter (Signed)
amoxicillin 500 mg has been sent in to the patient's pharmacy per Dr. Ernst Bowler. Patient is aware and was told to call our office with any issues or concerns.

## 2020-06-23 NOTE — Telephone Encounter (Signed)
Called patient, no vm is set up, unable to leave a message at this time.

## 2020-06-24 ENCOUNTER — Other Ambulatory Visit: Payer: Self-pay | Admitting: Gastroenterology

## 2020-06-24 ENCOUNTER — Other Ambulatory Visit: Payer: Self-pay | Admitting: Adult Health

## 2020-06-24 NOTE — Telephone Encounter (Signed)
Long discussion with the patient about her visit with Dr. Silverio Decamp last week.  Patient felt that she was never told about pancreatic cyst and that Dr. Silverio Decamp was arguing with her about her dx of Kidney disease. I discussed that the patient has had the cyst for many years and Dr. Felipa Eth noted on the scan she had by him in 2018 it was there and had been stable for 3 years prior.  We discussed that Dr. Silverio Decamp was probably trying to reassure her that her kidney labs were good on her labs from 3 months ago and that in her opinion it was safe to proceed with the MRI that was being ordered.  Patient states that Dr. Silverio Decamp was going to call in in an antifungal cream for her face.  Patient states she has not been called yet about MRI.  I explained the national shortage of contrast and that there will be a delay in GI reaching out to her to schedule.  Patient wanted an explanation why she was not told about Cyst by Dr. Felipa Eth.  She is advised that this is something that she will need to discuss with him but did explain that the test in 2018 was done for an acute problem and perhaps he gave her the results that nothing to explain her problem at the time and the cyst was an old finding and was felt to be benign and stable.  I listened to her for 30 minutes about her concerns.  She decided that she will proceed with the MRI when they call her to schedule and I will ask about anti-fungal cream for her rash.   Dr. Silverio Decamp

## 2020-06-25 ENCOUNTER — Encounter: Payer: Self-pay | Admitting: Gastroenterology

## 2020-06-25 MED ORDER — MICONAZOLE NITRATE 2 % EX CREA: 1.0000 "application " | TOPICAL_CREAM | Freq: Two times a day (BID) | CUTANEOUS | 0 refills | Status: DC

## 2020-06-25 NOTE — Telephone Encounter (Signed)
Patient notified

## 2020-06-25 NOTE — Telephone Encounter (Signed)
Attempted to reach the patient.  No answer/VM 

## 2020-06-25 NOTE — Telephone Encounter (Signed)
I tried to reassure patient in multiple different ways that her kidney function was within normal range and I have switched her imaging to MRI instead of CT.  Given she has persistent abdominal pain and findings on prior CT of pancreatic cyst, advised her that we will need further imaging to document stability of the cyst and avoid missing any acute pathology. I advised her to follow-up with Dr. Felipa Eth and check if she has persistent chronic kidney disease given she was questioning my ability to interpret the labs are my expertise as a physician. She had switched from [facility] to our practice because she was not happy with the care she was getting there with Dr. Cristina Gong.  She will need to follow-up with PMD and dermatology for skin rash, she has seen them in the past.  I told her I will be sending antifungal cream as a trial and to apply small pea-sized amount twice daily.  Miconazole 2%. Thank you

## 2020-06-29 ENCOUNTER — Telehealth: Payer: Self-pay | Admitting: Adult Health

## 2020-06-29 NOTE — Telephone Encounter (Signed)
Pt is asking for a call to discuss if she needs to taper off of acetaZOLAMIDE (DIAMOX) 125 MG tablet or if she can just stop.  Pt asking to come off of this because it adds to her depression, upsets her stomach and the biggest concern is it affects her vision.  Please call

## 2020-06-30 ENCOUNTER — Telehealth: Payer: Self-pay | Admitting: Neurology

## 2020-06-30 ENCOUNTER — Other Ambulatory Visit: Payer: Self-pay | Admitting: Gastroenterology

## 2020-06-30 ENCOUNTER — Encounter: Payer: Self-pay | Admitting: *Deleted

## 2020-06-30 NOTE — Telephone Encounter (Signed)
I returned patient's call to the answering service and spoke to her.  She stated she has been having some upset stomach and she thinks this is related to acetazolamide 125 mg daily that she is taken for years for essential tremor and she would like to stop it.  I advised her to taper it by taking it every other day for 1 week and then stop.  I advised her to call Dr. Jannifer Franklin to discuss other treatment options for essential tremor.  She voiced understanding.

## 2020-06-30 NOTE — Telephone Encounter (Signed)
I called and could not LM vm not set up.  We have not seen since 12-20-2018.

## 2020-07-01 DIAGNOSIS — I129 Hypertensive chronic kidney disease with stage 1 through stage 4 chronic kidney disease, or unspecified chronic kidney disease: Secondary | ICD-10-CM | POA: Diagnosis not present

## 2020-07-01 DIAGNOSIS — N1831 Chronic kidney disease, stage 3a: Secondary | ICD-10-CM | POA: Diagnosis not present

## 2020-07-01 DIAGNOSIS — E7800 Pure hypercholesterolemia, unspecified: Secondary | ICD-10-CM | POA: Diagnosis not present

## 2020-07-01 DIAGNOSIS — I700 Atherosclerosis of aorta: Secondary | ICD-10-CM | POA: Diagnosis not present

## 2020-07-01 DIAGNOSIS — E039 Hypothyroidism, unspecified: Secondary | ICD-10-CM | POA: Diagnosis not present

## 2020-07-01 DIAGNOSIS — Z1389 Encounter for screening for other disorder: Secondary | ICD-10-CM | POA: Diagnosis not present

## 2020-07-01 DIAGNOSIS — F331 Major depressive disorder, recurrent, moderate: Secondary | ICD-10-CM | POA: Diagnosis not present

## 2020-07-01 DIAGNOSIS — R413 Other amnesia: Secondary | ICD-10-CM | POA: Diagnosis not present

## 2020-07-01 DIAGNOSIS — Z0000 Encounter for general adult medical examination without abnormal findings: Secondary | ICD-10-CM | POA: Diagnosis not present

## 2020-07-01 NOTE — Telephone Encounter (Addendum)
I called pt and she called doctor on call and Dr. Leonie Man took care of it.  She states that she turns phone off till 1200 cause of needing sleep (get too many telemarketer calls).

## 2020-07-07 ENCOUNTER — Telehealth: Payer: Self-pay | Admitting: Allergy & Immunology

## 2020-07-07 NOTE — Telephone Encounter (Signed)
Patient states she was diagnosed with stage 3A chronic kidney disease and on her Allegra medication it says to consult a doctor regarding maybe taking a lower dose. Patient would like to know if she should be taking a lower dose or if she needs to be taking an alternative medication. Patient does not like the way Zyrtec makes her feel.    Please advise.

## 2020-07-07 NOTE — Telephone Encounter (Signed)
Dr. Ernst Bowler any recommendations? Please advise

## 2020-07-08 ENCOUNTER — Telehealth: Payer: Self-pay | Admitting: Adult Health

## 2020-07-08 MED ORDER — LORATADINE 10 MG PO TABS: 10.0000 mg | ORAL_TABLET | Freq: Two times a day (BID) | ORAL | 5 refills | Status: DC

## 2020-07-08 MED ORDER — PRIMIDONE 50 MG PO TABS: 50.0000 mg | ORAL_TABLET | Freq: Every day | ORAL | 1 refills | Status: DC

## 2020-07-08 NOTE — Telephone Encounter (Signed)
Claritin is metabolized extensively in the liver. Let's change to 10mg  twice daily instead of Allegra.

## 2020-07-08 NOTE — Telephone Encounter (Signed)
Prescription has been sent to the requested pharmacy. Patient has been made aware and verbalized understanding.

## 2020-07-08 NOTE — Telephone Encounter (Signed)
I called patient.  She followed Dr. Sabino Gasser instructions and its been over a week and now she took her last acetazolamide yesterday.  She says her  tremors are slightly worse but she seems to feel better, the side effects that she was experiencing less stress less irritability.  She questions if there was something better for her tremors.  She has an appointment with you Dr. Jannifer Franklin in October.  She did not want to see NP again. I did not offer appt.  Please advise.  [phone] home # best. Last seen 2020.

## 2020-07-08 NOTE — Telephone Encounter (Signed)
I called the patient.  The patient has come off of the acetazolamide, did not feel good on the medication.  In the past she has been on gabapentin and did not tolerate it.  I will try low-dose Mysoline for her tremors, started 50 mg at night.

## 2020-07-08 NOTE — Telephone Encounter (Signed)
Pt called needing to speak to the provider about her acetaZOLAMIDE (DIAMOX) 125 MG tablet pt was informed by the on call provider to call back during business hours to speak to her provider and discuss dosages.

## 2020-07-09 DIAGNOSIS — L509 Urticaria, unspecified: Secondary | ICD-10-CM | POA: Diagnosis not present

## 2020-07-09 DIAGNOSIS — Z6823 Body mass index (BMI) 23.0-23.9, adult: Secondary | ICD-10-CM | POA: Diagnosis not present

## 2020-07-09 DIAGNOSIS — M150 Primary generalized (osteo)arthritis: Secondary | ICD-10-CM | POA: Diagnosis not present

## 2020-07-09 DIAGNOSIS — M797 Fibromyalgia: Secondary | ICD-10-CM | POA: Diagnosis not present

## 2020-07-09 DIAGNOSIS — M2550 Pain in unspecified joint: Secondary | ICD-10-CM | POA: Diagnosis not present

## 2020-07-09 DIAGNOSIS — R768 Other specified abnormal immunological findings in serum: Secondary | ICD-10-CM | POA: Diagnosis not present

## 2020-07-15 ENCOUNTER — Ambulatory Visit: Payer: Medicare Other

## 2020-07-17 ENCOUNTER — Ambulatory Visit: Payer: Medicare Other

## 2020-07-20 ENCOUNTER — Telehealth: Payer: Self-pay

## 2020-07-20 NOTE — Telephone Encounter (Signed)
Patient called stating when she seen Dr Ernst Bowler 07/07/2020 he told her not to worry about the [facility] bill for the thyroid panel. Patient has received another bill and its a final notice. She is wondering if Dr Ernst Bowler handled this bill or not?   Date: 03/25/2020 Thyroid Panel Invoice #1 ---- [id number] Invoice #2 ----  [id number] $88.00

## 2020-07-21 DIAGNOSIS — N3001 Acute cystitis with hematuria: Secondary | ICD-10-CM | POA: Diagnosis not present

## 2020-07-21 DIAGNOSIS — R350 Frequency of micturition: Secondary | ICD-10-CM | POA: Diagnosis not present

## 2020-07-21 NOTE — Telephone Encounter (Signed)
Call patient back.  Informed patient that [facility] called to inquire about $88.00.  [facility] was informed her they patients primary insurance paid over $2,000 dollars and the $88.00 was left over.  The $88.00 dollars was then filed with patients secondary insurance.  Patient stated she will call secondary insurance. Patient then ask if she could come for her injections at 3 instead of 2:30?  I asked Ashleigh if that was possible and she checked the schedule and stated 3 will be okay.  Patient was informed she could come at 3.

## 2020-07-21 NOTE — Telephone Encounter (Signed)
Called patient to give information regarding bill.  Per answering service the phone is not accepting calls at the time.

## 2020-07-21 NOTE — Telephone Encounter (Signed)
Ernst Bowler has information and working on this.

## 2020-07-21 NOTE — Telephone Encounter (Signed)
I will think is enough to worry about it, I told her that sometimes insurance companies and [facility] take some time to figure this out.  Can someone talk to [facility] to see if there is some form I can fill out?  Usually there is a form that request a different diagnosis code.  Salvatore Marvel, MD [facility] [place] [facility] [place]

## 2020-07-22 ENCOUNTER — Ambulatory Visit: Payer: Medicare Other

## 2020-07-24 ENCOUNTER — Ambulatory Visit: Payer: Medicare Other

## 2020-07-28 ENCOUNTER — Ambulatory Visit
Admission: RE | Admit: 2020-07-28 | Discharge: 2020-07-28 | Disposition: A | Discharge status: Home / Self Care | Payer: Medicare Other | Source: Ambulatory Visit | Attending: Gastroenterology | Admitting: Gastroenterology

## 2020-07-28 DIAGNOSIS — K582 Mixed irritable bowel syndrome: Secondary | ICD-10-CM

## 2020-07-28 DIAGNOSIS — R21 Rash and other nonspecific skin eruption: Secondary | ICD-10-CM

## 2020-07-28 DIAGNOSIS — K862 Cyst of pancreas: Secondary | ICD-10-CM

## 2020-07-28 DIAGNOSIS — K8689 Other specified diseases of pancreas: Secondary | ICD-10-CM | POA: Diagnosis not present

## 2020-07-28 DIAGNOSIS — N281 Cyst of kidney, acquired: Secondary | ICD-10-CM | POA: Diagnosis not present

## 2020-07-28 MED ORDER — GADOBENATE DIMEGLUMINE 529 MG/ML IV SOLN
12.0000 mL | Freq: Once | INTRAVENOUS | Status: AC | PRN
  Administered 2020-07-28: 12 mL via INTRAVENOUS

## 2020-07-30 ENCOUNTER — Other Ambulatory Visit: Payer: Self-pay | Admitting: Gastroenterology

## 2020-08-04 ENCOUNTER — Telehealth: Payer: Self-pay | Admitting: Gastroenterology

## 2020-08-04 NOTE — Telephone Encounter (Signed)
Patient calling to discuss MRCP results.. Plz advsie

## 2020-08-05 ENCOUNTER — Ambulatory Visit: Payer: Medicare Other

## 2020-08-07 NOTE — Telephone Encounter (Signed)
Patient calling wants to discuss MRI/MRCP results.. Plz advise

## 2020-08-07 NOTE — Telephone Encounter (Signed)
Reviewed the report with the patient. She feels reassured now and thanks me for my call.

## 2020-08-10 ENCOUNTER — Telehealth: Payer: Self-pay | Admitting: Gastroenterology

## 2020-08-10 NOTE — Telephone Encounter (Signed)
Inbound call from patient. Asked if she could get a physical copy of her results mailed to her.

## 2020-08-10 NOTE — Telephone Encounter (Signed)
MRI/MRCP report printed. It will go out with the mail tomorrow.

## 2020-08-11 DIAGNOSIS — N3000 Acute cystitis without hematuria: Secondary | ICD-10-CM | POA: Diagnosis not present

## 2020-08-11 DIAGNOSIS — N390 Urinary tract infection, site not specified: Secondary | ICD-10-CM | POA: Diagnosis not present

## 2020-08-12 ENCOUNTER — Other Ambulatory Visit: Payer: Self-pay

## 2020-08-12 ENCOUNTER — Ambulatory Visit (INDEPENDENT_AMBULATORY_CARE_PROVIDER_SITE_OTHER): Payer: Medicare Other

## 2020-08-12 DIAGNOSIS — L501 Idiopathic urticaria: Secondary | ICD-10-CM | POA: Diagnosis not present

## 2020-08-25 ENCOUNTER — Telehealth: Payer: Self-pay

## 2020-08-25 NOTE — Telephone Encounter (Signed)
Patient called stating since her last Xolair injection she has been having a lot of problems with itching again.  Patient started itching more last night on the left leg and left side of her neck. The itching feels like its up under her skin. She states she does have a Rash all over her body currently. She has tried the Triamcinolone and it isn't working at all along with the hydroxyzine. Patient states she has used half the jar of Triamcinolone. Patient doesn't want to take any more prednisone but she will if she really needs to.  Patient is requesting the following medications that she researched may help with the itching: Ivermectin  Plaquenil  Patient does have a Kidney Disease & Pancreatic Cysts   [name]

## 2020-08-26 NOTE — Telephone Encounter (Signed)
Has the patient seen Dermatology?   We did Strongylodes testing (parasite) and this was negative. She does not need ivermectin.  Plaquenil causes eye issues. I hesitant to use this in someone her age. And I have yet to see someone who benefits from this from a hive/itching perspective.   We can send in prednisone 10mg  BID for 7 days.   Salvatore Marvel, MD [facility] and [place] [facility] [place]

## 2020-08-26 NOTE — Telephone Encounter (Signed)
Spoke with patient, she stated that she has seen dermatology 4 months prior to coming to see Dr. Ernst Bowler. She stated that she is concerned with taking prednisone due to her artifical hips. She stated that prednisone weakens the bones, she has noticed that after taking it her hips are sore and is worried something may happen to her hips. She stated that if Dr. Ernst Bowler felt like it wouldn't damage her hips then she will take it but would prefer an alternative recommendation. Please advise.

## 2020-08-28 ENCOUNTER — Other Ambulatory Visit: Payer: Self-pay

## 2020-08-28 MED ORDER — PREDNISONE 10 MG PO TABS: ORAL_TABLET | ORAL | 0 refills | Status: DC

## 2020-08-28 NOTE — Telephone Encounter (Signed)
She can increase her Allegra to one tablet twice daily. The prednisone can be VERY low - 10mg  daily if she would like. That should minimize the side effect.  Salvatore Marvel, MD [facility] [place] [facility] [place]

## 2020-08-28 NOTE — Telephone Encounter (Signed)
I called the patient to go over medication note. Patient is going to take the Claritin 3 times a day. She takes 20mg  Pepcid 4 times a day and states that works for her. She agreed to take the prednisone 10mg  1 tablet for 7 days to help with symptoms. She plans to call at the end of the week to update [place] on her symptoms to see if her Xolair injection needs to be rescheduled. She understands she is to avoid all shellfish due to possible reaction and history of intolerance.

## 2020-08-28 NOTE — Telephone Encounter (Signed)
Please advise patient called back to go over previous note about prednisone and allergy symptoms.

## 2020-08-28 NOTE — Telephone Encounter (Signed)
Please advise for an alternative for the prednisone. The patient called back today because she has gotten worse. She also ate shrimp last night and is not sure if the cause. She was allergic as a child to shellfish. The elastic in her underwear causes a rash and itching. She has to cut up her clothes to prevent the itching and rashes. She also has a concern about Xolair injections she is due for one on 09/09/20, but is not sure if this is another cause of her being sick. She possibly missed four before she restarted. She has an office visit 09/23/20 she id interested in allergy testing.

## 2020-09-01 ENCOUNTER — Telehealth: Payer: Self-pay | Admitting: Allergy & Immunology

## 2020-09-01 NOTE — Telephone Encounter (Signed)
Pt has stage 3c kidney disease and can not do the allegra she is doing Claritin 3 tablets daily 10mg  each and 10 of prednisone if it gets real bad she puts triamcinolone on

## 2020-09-01 NOTE — Telephone Encounter (Signed)
Yes, [name] definitely needs to come in for her Xolair shot.  If this is not working, we can either increase the frequency or try something completely different.  Salvatore Marvel, MD [facility] [place] [facility] [place]

## 2020-09-01 NOTE — Telephone Encounter (Signed)
Pt informed of this and stated understanding  

## 2020-09-01 NOTE — Telephone Encounter (Signed)
Pt called and states if she should come I next week to get her Xolair due to last time it leaving a rash

## 2020-09-02 NOTE — Telephone Encounter (Signed)
Please review Carrie's previous message.

## 2020-09-02 NOTE — Telephone Encounter (Signed)
Noted.   Salvatore Marvel, MD [facility] [place] [facility] [place]

## 2020-09-05 ENCOUNTER — Encounter: Payer: Self-pay | Admitting: Gastroenterology

## 2020-09-08 DIAGNOSIS — L501 Idiopathic urticaria: Secondary | ICD-10-CM | POA: Diagnosis not present

## 2020-09-09 ENCOUNTER — Other Ambulatory Visit: Payer: Self-pay

## 2020-09-09 ENCOUNTER — Ambulatory Visit (INDEPENDENT_AMBULATORY_CARE_PROVIDER_SITE_OTHER): Payer: Medicare Other | Admitting: *Deleted

## 2020-09-09 DIAGNOSIS — L501 Idiopathic urticaria: Secondary | ICD-10-CM | POA: Diagnosis not present

## 2020-09-11 ENCOUNTER — Telehealth: Payer: Self-pay | Admitting: Gastroenterology

## 2020-09-11 NOTE — Telephone Encounter (Signed)
Inbound call from patient requesting a call from a nurse please.  Inquiring about needing to repeat colonoscopy.  Please advise.

## 2020-09-14 NOTE — Telephone Encounter (Signed)
She is due a repeat colonoscopy this year. What is the procedure for this?

## 2020-09-15 ENCOUNTER — Other Ambulatory Visit: Payer: Self-pay | Admitting: Gastroenterology

## 2020-09-16 ENCOUNTER — Ambulatory Visit: Payer: Medicare Other | Admitting: Allergy & Immunology

## 2020-09-16 ENCOUNTER — Telehealth: Payer: Self-pay | Admitting: Gastroenterology

## 2020-09-16 DIAGNOSIS — R339 Retention of urine, unspecified: Secondary | ICD-10-CM | POA: Diagnosis not present

## 2020-09-16 DIAGNOSIS — K5900 Constipation, unspecified: Secondary | ICD-10-CM | POA: Diagnosis not present

## 2020-09-16 DIAGNOSIS — R109 Unspecified abdominal pain: Secondary | ICD-10-CM | POA: Diagnosis not present

## 2020-09-16 DIAGNOSIS — R1010 Upper abdominal pain, unspecified: Secondary | ICD-10-CM | POA: Diagnosis not present

## 2020-09-16 NOTE — Telephone Encounter (Signed)
80 y/o fm with known pancreatic cyst, experienced severe stabbing left upper quadrant for several hours starting around 11:30 am, finally to urgent care around 4:30.  Was prescribed bentyl and magnesium citrate.  Pharmacy unable to fill mag citrate.  Pt was concerned that her pain may be related to her pancreatic cyst and that it had 'ruptured'.  I reassured Ms. Fennelly that pancreatic cysts typically do not cause sudden symptoms or rupture, and that I agreed with the urgent care provider that she would likely benefit from a good bowel movement.  She had not had a bowel movement in 3 days. She had also taken a dulcolax last night, and I suspect her pain may be secondary to that.  I recommended she start taking Miralax, and she could take a capful every 1-2 hours until she passed a stool.  Pt expressed gratitude at the reassurance and recommendations.    Dong Nimmons E. Candis Schatz, MD [facility]

## 2020-09-17 NOTE — Telephone Encounter (Signed)
Thanks Scott!! 

## 2020-09-23 ENCOUNTER — Ambulatory Visit: Payer: Medicare Other | Admitting: Allergy & Immunology

## 2020-09-24 ENCOUNTER — Telehealth: Payer: Self-pay | Admitting: Cardiology

## 2020-09-24 ENCOUNTER — Other Ambulatory Visit: Payer: Self-pay | Admitting: Orthopedic Surgery

## 2020-09-24 ENCOUNTER — Ambulatory Visit
Admission: RE | Admit: 2020-09-24 | Discharge: 2020-09-24 | Disposition: A | Discharge status: Home / Self Care | Payer: Medicare Other | Source: Ambulatory Visit | Attending: Orthopedic Surgery | Admitting: Orthopedic Surgery

## 2020-09-24 ENCOUNTER — Other Ambulatory Visit: Payer: Self-pay

## 2020-09-24 DIAGNOSIS — Z96643 Presence of artificial hip joint, bilateral: Secondary | ICD-10-CM | POA: Diagnosis not present

## 2020-09-24 DIAGNOSIS — M160 Bilateral primary osteoarthritis of hip: Secondary | ICD-10-CM | POA: Diagnosis not present

## 2020-09-24 DIAGNOSIS — R52 Pain, unspecified: Secondary | ICD-10-CM

## 2020-09-24 DIAGNOSIS — Z471 Aftercare following joint replacement surgery: Secondary | ICD-10-CM | POA: Diagnosis not present

## 2020-09-24 DIAGNOSIS — M545 Low back pain, unspecified: Secondary | ICD-10-CM | POA: Diagnosis not present

## 2020-09-30 DIAGNOSIS — M5136 Other intervertebral disc degeneration, lumbar region: Secondary | ICD-10-CM | POA: Diagnosis not present

## 2020-09-30 DIAGNOSIS — M25551 Pain in right hip: Secondary | ICD-10-CM | POA: Diagnosis not present

## 2020-09-30 DIAGNOSIS — Z96641 Presence of right artificial hip joint: Secondary | ICD-10-CM | POA: Diagnosis not present

## 2020-10-07 ENCOUNTER — Ambulatory Visit: Payer: Self-pay

## 2020-10-08 DIAGNOSIS — L501 Idiopathic urticaria: Secondary | ICD-10-CM | POA: Diagnosis not present

## 2020-10-09 ENCOUNTER — Other Ambulatory Visit: Payer: Self-pay

## 2020-10-09 ENCOUNTER — Ambulatory Visit (INDEPENDENT_AMBULATORY_CARE_PROVIDER_SITE_OTHER): Payer: Medicare Other

## 2020-10-09 DIAGNOSIS — L501 Idiopathic urticaria: Secondary | ICD-10-CM | POA: Diagnosis not present

## 2020-10-11 ENCOUNTER — Other Ambulatory Visit: Payer: Self-pay | Admitting: Gastroenterology

## 2020-10-13 DIAGNOSIS — I10 Essential (primary) hypertension: Secondary | ICD-10-CM | POA: Diagnosis not present

## 2020-10-13 DIAGNOSIS — M5441 Lumbago with sciatica, right side: Secondary | ICD-10-CM | POA: Diagnosis not present

## 2020-10-26 ENCOUNTER — Telehealth: Payer: Self-pay

## 2020-10-26 NOTE — Telephone Encounter (Signed)
Patient called stating she received a prednisone shot from her orthopedic surgeon Dr Paralee Cancel on 09/30/2020 for her back. Patient currently has a slipped disk and may need surgery soon. Patient states she hasn't had hives since getting the steroid shot. She is wondering if she should just hold off on her Xolair as she is worried it may cause her to flare back up again. Patient feels like she has these flare ups when she gets her Xolair injection.   Patient is scheduled for a follow up televisit on 11/05/2020 with Dr Ernst Bowler.   She is due back for her next shot on 11/06/2020 in [place].

## 2020-10-27 ENCOUNTER — Other Ambulatory Visit: Payer: Self-pay | Admitting: Geriatric Medicine

## 2020-10-27 DIAGNOSIS — Z1231 Encounter for screening mammogram for malignant neoplasm of breast: Secondary | ICD-10-CM

## 2020-10-27 NOTE — Telephone Encounter (Signed)
I do not think she should miss her Xolair.  I think that we will only send her behind.  Salvatore Marvel, MD [facility] and [place] [facility] [place]

## 2020-10-28 ENCOUNTER — Ambulatory Visit: Payer: Medicare Other | Admitting: Allergy & Immunology

## 2020-10-28 NOTE — Telephone Encounter (Signed)
Spoke to "[name]" and let her know she shouldn't miss her Xolair and she understood and acknowledged it.

## 2020-11-02 DIAGNOSIS — N1831 Chronic kidney disease, stage 3a: Secondary | ICD-10-CM | POA: Diagnosis not present

## 2020-11-02 DIAGNOSIS — G4489 Other headache syndrome: Secondary | ICD-10-CM | POA: Diagnosis not present

## 2020-11-02 DIAGNOSIS — Z23 Encounter for immunization: Secondary | ICD-10-CM | POA: Diagnosis not present

## 2020-11-02 DIAGNOSIS — I129 Hypertensive chronic kidney disease with stage 1 through stage 4 chronic kidney disease, or unspecified chronic kidney disease: Secondary | ICD-10-CM | POA: Diagnosis not present

## 2020-11-02 DIAGNOSIS — R413 Other amnesia: Secondary | ICD-10-CM | POA: Diagnosis not present

## 2020-11-03 ENCOUNTER — Other Ambulatory Visit: Payer: Self-pay | Admitting: Gastroenterology

## 2020-11-05 ENCOUNTER — Other Ambulatory Visit: Payer: Self-pay

## 2020-11-05 ENCOUNTER — Ambulatory Visit (INDEPENDENT_AMBULATORY_CARE_PROVIDER_SITE_OTHER): Payer: Medicare Other | Admitting: Allergy & Immunology

## 2020-11-05 ENCOUNTER — Encounter: Payer: Self-pay | Admitting: Allergy & Immunology

## 2020-11-05 DIAGNOSIS — R21 Rash and other nonspecific skin eruption: Secondary | ICD-10-CM | POA: Diagnosis not present

## 2020-11-05 DIAGNOSIS — L501 Idiopathic urticaria: Secondary | ICD-10-CM | POA: Diagnosis not present

## 2020-11-05 NOTE — Progress Notes (Signed)
RE: [name] MRN: [id number] DOB: [birth year] Date of Telemedicine Visit: 11/05/2020  Referring provider: Lajean Manes, MD Primary care provider: Lajean Manes, MD  Chief Complaint: Follow-up   Telemedicine Follow Up Visit via Telephone: I connected with [name] for a follow up on 11/05/20 by telephone and verified that I am speaking with the correct person using two identifiers.   I discussed the limitations, risks, security and privacy concerns of performing an evaluation and management service by telephone and the availability of in person appointments. I also discussed with the patient that there may be a patient responsible charge related to this service. The patient expressed understanding and agreed to proceed.  Patient is at home.  Provider is at the office.  Visit start time: 4:55 PM Visit end time: 5:20 PM Insurance consent/check in by: [facility] consent and medical assistant/nurse: Vallarie Mare  History of Present Illness:  She is a 80 y.o. female, who is being followed for rash as well as perceived food reactions and chronic rhinitis. Her previous allergy office visit was in April 2022 with myself.  At that time, her Xolair seem to be working well.  We recommended adding a lower strength triamcinolone to the upper chest.  We continue with Allegra nightly.  She went on and on about a parasitic infection.  Therefore, we got some blood work to look for evidence of this.  This was negative.  Since the last visit, she has been doing well. She is now afraid that she has to have back surgery. Her L4 is slipped over her L5. She is in a lot of pain and is having trouble walking. She also has artificial hips. She started having trouble and she thought it was her hip. X rays were normal and it was felt that she has a lot of arthritis inflammation in her back. Hips were good. She received a shot of prednisone in her spine with inflammation. She had resolution of her hives from  the prednisone injection. She is feeling great.   She also is wondering about Allegra for Hives. She is on Prozac right now as a taper from Cymbalta. She is currently doing a one month overlap of the two of them. She is on loratadine which interacts with Prozac. She is wondering about whether she should be taking both of these. She has never had a reaction between the two drugs.  She shuffles around some paperwork to try to find the reaction that she found, but she did not find it.  She is going to tell the staff when she does find it.  As a final question, she wanted to know if she needs to have an x-ray for a tick bite from years ago in her shoulder.  She said she sometimes moves around and feels the tick bite.  It is uncomfortable.  This is not a daily occurrence.  She has never had any oozing from it.  She has never had any kind of work-up for this.  Otherwise, there have been no changes to her past medical history, surgical history, family history, or social history.  Assessment and Plan:  [name] is a 80 y.o. 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 I think the prednisone injection likely cleared up the hives and I think that this will be temporary.  She does ask me about a tick bite from years ago in her shoulder.  She asked if she needs an x-ray and I told her that it is not necessary or useful.  We could look at doing an ultrasound, but I would like to evaluated in person first.  Diagnostics: None.  Medication List:  Current Outpatient Medications  Medication Sig Dispense Refill   albuterol (VENTOLIN HFA) 108 (90 Base) MCG/ACT inhaler TAKE 2  PUFFS BY MOUTH EVERY 6 HOURS AS NEEDED FOR WHEEZE OR SHORTNESS OF BREATH 18 g 3   Ascorbic Acid (VITAMIN C) 500 MG CAPS Take 500 mg by mouth as needed.     aspirin EC 81 MG tablet Take 81 mg by mouth daily.     bismuth subsalicylate (PEPTO BISMOL) 262 MG/15ML suspension Take 30 mLs by mouth every 6 (six) hours as needed.     Coenzyme Q10 (CO Q-10) 100 MG CAPS Take 100 mg by mouth daily.     cyanocobalamin 100 MCG tablet Take 100 mcg by mouth daily.     DULoxetine (CYMBALTA) 30 MG capsule Take 30 mg by mouth daily.     EPINEPHrine 0.3 mg/0.3 mL IJ SOAJ injection Inject 0.3 mg into the muscle as needed.     ergocalciferol (VITAMIN D2) 50000 UNITS capsule Take 50,000 Units by mouth once a week.     famotidine (PEPCID) 20 MG tablet TAKE 1 TABLET 3 TIMES DAILY (BEFORE BREAKFAST AND DINNER AND ANOTHER ONE BEFORE BEDTIME) 90 tablet 0   fexofenadine (ALLEGRA) 180 MG tablet Take 1 tablet by mouth daily.     HYDROcodone-acetaminophen (NORCO) 7.5-325 MG per tablet Take 1 tablet by mouth 2 (two) times daily. For pain     hydrOXYzine (ATARAX/VISTARIL) 25 MG tablet Take 25 mg by mouth as needed.     hyoscyamine (LEVSIN SL) 0.125 MG SL tablet DISSOLVE 1 TABLET UNDER THE TONGUE EVERY 6 HOURS AS NEEDED 60 tablet 0   ipratropium (ATROVENT) 0.06 % nasal spray Place into both nostrils as needed.     levothyroxine (SYNTHROID, LEVOTHROID) 50 MCG tablet Take 50 mcg by mouth daily before breakfast. Once daily     loratadine (CLARITIN) 10 MG tablet Take 1 tablet (10 mg total) by mouth in the morning and at bedtime. 60 tablet 5   LORazepam (ATIVAN) 1 MG tablet Take 1 tablet by mouth at bedtime.     losartan (COZAAR) 100 MG tablet Take 100 mg by mouth daily.     miconazole (MICOTIN) 2 % cream Apply 1 application topically 2 (two) times daily. 28.35 g 0   Multiple Vitamins-Minerals (PRESERVISION AREDS) TABS Take 1 tablet by mouth daily.     predniSONE (DELTASONE) 10 MG tablet Take one tablet by mouth for 7 days then STOP.  7 tablet 0   primidone (MYSOLINE) 50 MG tablet Take 1 tablet (50 mg total) by mouth at bedtime. 30 tablet 1   SUMAtriptan (IMITREX) 50 MG tablet Take 50 mg by mouth every 2 (two) hours as needed. For migraine     triamcinolone (KENALOG) 0.025 % ointment Apply 1 application topically 2 (two) times daily. 160 g 6   Current Facility-Administered Medications  Medication Dose Route Frequency Provider Last Rate Last Admin   omalizumab Arvid Right) injection 300 mg  300 mg Subcutaneous Q28  days Valentina Shaggy, MD   300 mg at 10/09/20 1501   Allergies: Allergies  Allergen Reactions   Crestor [Rosuvastatin Calcium]     myalgia   Zocor [Simvastatin]     myalgia   Enalapril     swelling   Lamictal [Lamotrigine]     rash   Nitrofuran Derivatives Nausea Only   Erythromycin Other (See Comments)    Stomach burning   Methylprednisolone     Major depression, weakness, nausea   Nystatin Other (See Comments)    Blisters on her lips   Vivelle [Estradiol]     rash   Zetia [Ezetimibe]     Muscle weakness   Ciprofloxacin Nausea Only   Ciprofloxacin Hcl     rash   Latex Rash   Shellfish Allergy Rash   Sulfa Antibiotics Nausea Only   Sulfasalazine Nausea Only   I reviewed her past medical history, social history, family history, and environmental history and no significant changes have been reported from previous visits.  Review of Systems  Constitutional:  Negative for activity change, appetite change, chills, diaphoresis and fatigue.  HENT:  Negative for congestion, postnasal drip, rhinorrhea, sinus pressure and sore throat.   Eyes:  Negative for pain, discharge, redness and itching.  Respiratory:  Negative for shortness of breath, wheezing and stridor.   Gastrointestinal:  Negative for diarrhea, nausea and vomiting.  Endocrine: Negative for cold intolerance and heat intolerance.  Musculoskeletal:  Negative for arthralgias, joint swelling and myalgias.  Skin:  Negative for rash.   Allergic/Immunologic: Negative for environmental allergies and food allergies.   Objective:  Physical exam not obtained as encounter was done via telephone.   Previous notes and tests were reviewed.  I discussed the assessment and treatment plan with the patient. The patient was provided an opportunity to ask questions and all were answered. The patient agreed with the plan and demonstrated an understanding of the instructions.   The patient was advised to call back or seek an in-person evaluation if the symptoms worsen or if the condition fails to improve as anticipated.  I provided 25 minutes of non-face-to-face time during this encounter.  It was my pleasure to participate in [place] care today. Please feel free to contact me with any questions or concerns.   Sincerely,  Valentina Shaggy, MD

## 2020-11-05 NOTE — Patient Instructions (Signed)
1. Rash - with testing positive to cat, dog, grasses, weeds, and trees - We are going to continue with the Xolair every 28 days.  - We discussed the side effect profile, including the cancer  2. Concern for parasitic infection - We are going to get some blood work to look for evidence of this.  - We can send in ivermectin if this is positive.   3. No follow-ups on file.    Please inform us of any Emergency Department visits, hospitalizations, or changes in symptoms. Call us before going to the ED for breathing or allergy symptoms since we might be able to fit you in for a sick visit. Feel free to contact us anytime with any questions, problems, or concerns.  It was a pleasure to see you again today!  Websites that have reliable patient information: 1. American Academy of Asthma, Allergy, and Immunology: www.aaaai.org 2. Food Allergy Research and Education (FARE): foodallergy.org 3. Mothers of Asthmatics: http://www.asthmacommunitynetwork.org 4. American College of Allergy, Asthma, and Immunology: www.acaai.org   COVID-19 Vaccine Information can be found at: ShippingScam.co.uk For questions related to vaccine distribution or appointments, please email [email or web address] or call [phone].   We realize that you might be concerned about having an allergic reaction to the COVID19 vaccines. To help with that concern, WE ARE OFFERING THE COVID19 VACCINES IN OUR OFFICE! Ask the front desk for dates!     "Like" [place] on Facebook and Instagram for our latest updates!      A healthy democracy works best when [employer] participate! Make sure you are registered to vote! If you have moved or changed any of your contact information, you will need to get this updated before voting!  In some cases, you MAY be able to register to vote online: [email or web address]

## 2020-11-06 ENCOUNTER — Ambulatory Visit: Payer: Medicare Other

## 2020-11-07 DIAGNOSIS — R300 Dysuria: Secondary | ICD-10-CM | POA: Diagnosis not present

## 2020-11-07 DIAGNOSIS — N3000 Acute cystitis without hematuria: Secondary | ICD-10-CM | POA: Diagnosis not present

## 2020-11-13 ENCOUNTER — Ambulatory Visit: Payer: Medicare Other

## 2020-11-16 DIAGNOSIS — R0602 Shortness of breath: Secondary | ICD-10-CM | POA: Diagnosis not present

## 2020-11-16 DIAGNOSIS — R059 Cough, unspecified: Secondary | ICD-10-CM | POA: Diagnosis not present

## 2020-11-16 DIAGNOSIS — J029 Acute pharyngitis, unspecified: Secondary | ICD-10-CM | POA: Diagnosis not present

## 2020-11-16 DIAGNOSIS — R52 Pain, unspecified: Secondary | ICD-10-CM | POA: Diagnosis not present

## 2020-11-20 DIAGNOSIS — R350 Frequency of micturition: Secondary | ICD-10-CM | POA: Diagnosis not present

## 2020-11-20 DIAGNOSIS — E039 Hypothyroidism, unspecified: Secondary | ICD-10-CM | POA: Diagnosis not present

## 2020-11-26 ENCOUNTER — Ambulatory Visit (INDEPENDENT_AMBULATORY_CARE_PROVIDER_SITE_OTHER): Payer: Medicare Other | Admitting: Neurology

## 2020-11-26 ENCOUNTER — Encounter: Payer: Self-pay | Admitting: *Deleted

## 2020-11-26 VITALS — BP 148/88 | HR 80 | Ht 64.0 in | Wt 139.0 lb

## 2020-11-26 DIAGNOSIS — G250 Essential tremor: Secondary | ICD-10-CM | POA: Diagnosis not present

## 2020-11-26 DIAGNOSIS — G43819 Other migraine, intractable, without status migrainosus: Secondary | ICD-10-CM | POA: Diagnosis not present

## 2020-11-26 DIAGNOSIS — R413 Other amnesia: Secondary | ICD-10-CM | POA: Diagnosis not present

## 2020-11-26 DIAGNOSIS — M797 Fibromyalgia: Secondary | ICD-10-CM | POA: Diagnosis not present

## 2020-11-26 MED ORDER — PRIMIDONE 50 MG PO TABS: 50.0000 mg | ORAL_TABLET | Freq: Every day | ORAL | 2 refills | Status: DC

## 2020-11-26 NOTE — Patient Instructions (Signed)
We will take Primidone 50 mg at night for the tremor.

## 2020-11-26 NOTE — Progress Notes (Signed)
Reason for visit: Tremor  [name] is an 80 y.o. female  History of present illness:  [name] is a 80 year old right-handed white female with a history of a tremor.  She has occasional headaches, she has diffuse neuromuscular discomfort and may have fibromyalgia.  She does report a mild memory problem.  She has significant issues with depression, she cares for her [name] who is having a lot of medical issues.  [name] may be getting demented with confusion and urinary incontinence.  The patient came off of Diamox for her tremor due to some stomach upset.  The tremor seems to be a bit worse in the morning.  She has noted some changes in her handwriting but she is still able to generate handwriting fairly well.  There is no resting component to the tremor.  Past Medical History:  Diagnosis Date   ADD (attention deficit disorder)    Anxiety    Arthritis    Asthma    Chronic kidney disease    CKD Stage 3   Chronic renal insufficiency, stage III (moderate) (HCC)    Depression    major depression   Dermatitis    Diverticulosis    Essential tremor 05/17/2012   Fibromyalgia    GERD (gastroesophageal reflux disease)    Hyperlipidemia    Hypertension    Memory loss 05/17/2012   Migraines    Occasional tremors    Pneumonia    TIA (transient ischemic attack)    History of 2 TIA's per pt   Tremor     Past Surgical History:  Procedure Laterality Date   ABDOMINAL HYSTERECTOMY     APPENDECTOMY     CATARACT EXTRACTION Bilateral    TONSILLECTOMY AND ADENOIDECTOMY     age 18   TOTAL HIP ARTHROPLASTY Bilateral 2009   Dr Adriana Mccallum    Family History  Problem Relation Age of Onset   Liver cancer Mother    Pulmonary embolism Mother    Prostate cancer Father    Stroke Maternal Grandmother    Heart failure Maternal Grandfather    Emphysema Maternal Grandfather    Congestive Heart Failure Maternal Grandfather    Coronary artery disease Paternal Grandmother    Coronary  artery disease Paternal Grandfather    Prostate cancer Paternal Uncle        x 2   ALS Paternal 32    Brain cancer Paternal Aunt    Breast cancer Paternal Aunt    Lung cancer Maternal Aunt    Depression Brother        suicide   Diabetes Paternal Aunt        x 2   Breast cancer Cousin    Colon cancer Neg Hx    Esophageal cancer Neg Hx    Rectal cancer Neg Hx    Stomach cancer Neg Hx     Social history:  reports that she has never smoked. She has never used smokeless tobacco. She reports that she does not drink alcohol and does not use drugs.    Allergies  Allergen Reactions   Crestor [Rosuvastatin Calcium]     myalgia   Zocor [Simvastatin]     myalgia   Enalapril     swelling   Lamictal [Lamotrigine]     rash   Nitrofuran Derivatives Nausea Only   Erythromycin Other (See Comments)    Stomach burning   Methylprednisolone     Major depression, weakness, nausea   Nystatin Other (See Comments)  Blisters on her lips   Vivelle [Estradiol]     rash   Zetia [Ezetimibe]     Muscle weakness   Ciprofloxacin Nausea Only   Ciprofloxacin Hcl     rash   Latex Rash   Shellfish Allergy Rash   Sulfa Antibiotics Nausea Only   Sulfasalazine Nausea Only    Medications:  Prior to Admission medications   Medication Sig Start Date End Date Taking? Authorizing Provider  aspirin EC 81 MG tablet Take 81 mg by mouth daily.   Yes [provider]  bismuth subsalicylate (PEPTO BISMOL) 262 MG/15ML suspension Take 30 mLs by mouth every 6 (six) hours as needed.   Yes [provider]  Coenzyme Q10 (CO Q-10) 100 MG CAPS Take 100 mg by mouth daily.   Yes [provider]  EPINEPHrine 0.3 mg/0.3 mL IJ SOAJ injection Inject 0.3 mg into the muscle as needed. 05/15/20  Yes [provider]  ergocalciferol (VITAMIN D2) 50000 UNITS capsule Take 50,000 Units by mouth once a week.   Yes [provider]  famotidine (PEPCID) 20 MG tablet TAKE 1 TABLET 3 TIMES  DAILY (BEFORE BREAKFAST AND DINNER AND ANOTHER ONE BEFORE BEDTIME) 10/12/20  Yes Nandigam, Venia Minks, MD  FLUoxetine (PROZAC) 20 MG capsule Take 20 mg by mouth daily. 11/18/20  Yes [provider]  HYDROcodone-acetaminophen (NORCO) 7.5-325 MG per tablet Take 1 tablet by mouth 2 (two) times daily. For pain   Yes [provider]  hyoscyamine (LEVSIN SL) 0.125 MG SL tablet DISSOLVE 1 TABLET UNDER THE TONGUE EVERY 6 HOURS AS NEEDED 11/04/20  Yes Nandigam, Kavitha V, MD  ipratropium (ATROVENT) 0.06 % nasal spray Place into both nostrils as needed. 01/18/18  Yes [provider]  levothyroxine (SYNTHROID, LEVOTHROID) 50 MCG tablet Take 50 mcg by mouth daily before breakfast. Once daily 08/07/13  Yes [provider]  loratadine (CLARITIN) 10 MG tablet Take 1 tablet (10 mg total) by mouth in the morning and at bedtime. 07/08/20  Yes Valentina Shaggy, MD  LORazepam (ATIVAN) 1 MG tablet Take 1 tablet by mouth at bedtime. 05/17/14  Yes [provider]  losartan (COZAAR) 100 MG tablet Take 100 mg by mouth daily.   Yes [provider]  miconazole (MICOTIN) 2 % cream Apply 1 application topically 2 (two) times daily. 06/25/20  Yes Nandigam, Venia Minks, MD  Multiple Vitamins-Minerals (PRESERVISION AREDS) TABS Take 1 tablet by mouth daily.   Yes [provider]  SUMAtriptan (IMITREX) 50 MG tablet Take 50 mg by mouth every 2 (two) hours as needed. For migraine   Yes [provider]  triamcinolone (KENALOG) 0.025 % ointment Apply 1 application topically 2 (two) times daily. 06/12/20  Yes Valentina Shaggy, MD  albuterol (VENTOLIN HFA) 108 (90 Base) MCG/ACT inhaler TAKE 2 PUFFS BY MOUTH EVERY 6 HOURS AS NEEDED FOR WHEEZE OR SHORTNESS OF BREATH Patient not taking: Reported on 11/26/2020 05/16/19   Rigoberto Noel, MD  cyanocobalamin 100 MCG tablet Take 100 mcg by mouth daily. Patient not taking: Reported on 11/26/2020    [provider]   DULoxetine (CYMBALTA) 30 MG capsule Take 30 mg by mouth daily. Patient not taking: Reported on 11/26/2020    [provider]  hydrOXYzine (ATARAX/VISTARIL) 25 MG tablet Take 25 mg by mouth as needed. Patient not taking: Reported on 11/26/2020 02/13/20   [provider]  primidone (MYSOLINE) 50 MG tablet Take 1 tablet (50 mg total) by mouth at bedtime. Patient not  taking: Reported on 11/26/2020 07/08/20   Kathrynn Ducking, MD    ROS:  Out of a complete 14 system review of symptoms, the patient complains only of the following symptoms, and all other reviewed systems are negative.  Tremor Depression Headache Memory problems  Blood pressure (!) 148/88, pulse 80, height 5\' 4"  (1.626 m), weight 139 lb (63 kg).  Physical Exam  General: The patient is alert and cooperative at the time of the examination.  Skin: No significant peripheral edema is noted.   Neurologic Exam  Mental status: The patient is alert and oriented x 3 at the time of the examination. The patient has apparent normal recent and remote memory, with an apparently normal attention span and concentration ability.  Mini-Mental status examination done today shows a total score 29/30.  The patient is able to name 19 four-legged animals in 60 seconds.   Cranial nerves: Facial symmetry is present. Speech is normal, no aphasia or dysarthria is noted. Extraocular movements are full. Visual fields are full.  Motor: The patient has good strength in all 4 extremities.  Sensory examination: Soft touch sensation is symmetric on the face, arms, and legs.  Coordination: The patient has good finger-nose-finger and heel-to-shin bilaterally.  There is a mild to moderate action type tremor seen with finger-nose-finger.  When drawing a spiral, minimal tremor is translated into the handwriting.    Gait and station: The patient has a normal gait. Tandem gait is slightly unsteady. Romberg is negative. No drift is  seen.  Reflexes: Deep tendon reflexes are symmetric.   Assessment/Plan:  1.  Tremor, action type  The patient may benefit from a low-dose of Mysoline, she was given a prescription in May 2022, but she does not recall ever having taken the medication.  We will start at a 50 mg dose at night, the patient may wish to split the tablet in half initially.  She we will follow-up here in 6 months, in the future she can be seen through Dr. Brett Fairy.  Jill Alexanders MD 11/26/2020 3:32 PM  [facility] [address] [place], [place] [address]  Phone [phone] Fax [phone]

## 2020-11-30 ENCOUNTER — Ambulatory Visit: Payer: Medicare Other

## 2020-12-02 ENCOUNTER — Other Ambulatory Visit: Payer: Self-pay | Admitting: Gastroenterology

## 2020-12-09 ENCOUNTER — Other Ambulatory Visit: Payer: Self-pay | Admitting: Neurosurgery

## 2020-12-09 DIAGNOSIS — M5441 Lumbago with sciatica, right side: Secondary | ICD-10-CM

## 2020-12-15 DIAGNOSIS — I129 Hypertensive chronic kidney disease with stage 1 through stage 4 chronic kidney disease, or unspecified chronic kidney disease: Secondary | ICD-10-CM | POA: Diagnosis not present

## 2020-12-15 DIAGNOSIS — N1831 Chronic kidney disease, stage 3a: Secondary | ICD-10-CM | POA: Diagnosis not present

## 2020-12-15 DIAGNOSIS — F439 Reaction to severe stress, unspecified: Secondary | ICD-10-CM | POA: Diagnosis not present

## 2020-12-20 ENCOUNTER — Other Ambulatory Visit: Payer: Self-pay | Admitting: Gastroenterology

## 2020-12-26 ENCOUNTER — Other Ambulatory Visit: Payer: Self-pay

## 2020-12-26 ENCOUNTER — Ambulatory Visit
Admission: RE | Admit: 2020-12-26 | Discharge: 2020-12-26 | Disposition: A | Discharge status: Home / Self Care | Payer: Medicare Other | Source: Ambulatory Visit | Attending: Neurosurgery | Admitting: Neurosurgery

## 2020-12-26 DIAGNOSIS — M48061 Spinal stenosis, lumbar region without neurogenic claudication: Secondary | ICD-10-CM | POA: Diagnosis not present

## 2020-12-26 DIAGNOSIS — M545 Low back pain, unspecified: Secondary | ICD-10-CM | POA: Diagnosis not present

## 2020-12-26 DIAGNOSIS — M5441 Lumbago with sciatica, right side: Secondary | ICD-10-CM

## 2021-01-04 ENCOUNTER — Other Ambulatory Visit: Payer: Self-pay | Admitting: Gastroenterology

## 2021-01-12 DIAGNOSIS — M4316 Spondylolisthesis, lumbar region: Secondary | ICD-10-CM | POA: Diagnosis not present

## 2021-01-12 DIAGNOSIS — I10 Essential (primary) hypertension: Secondary | ICD-10-CM | POA: Diagnosis not present

## 2021-01-12 DIAGNOSIS — M4696 Unspecified inflammatory spondylopathy, lumbar region: Secondary | ICD-10-CM | POA: Diagnosis not present

## 2021-01-18 ENCOUNTER — Telehealth: Payer: Self-pay | Admitting: Gastroenterology

## 2021-01-18 NOTE — Telephone Encounter (Signed)
This patient called and states she has been sick feeling for quite a while and wanted to know if there could be some kind of medication called in for her to help with her symptoms.  Her symptoms are:  bitter taste in her mouth all the time, white coating on her tongue she's had for a while, if she drinks anything or takes her medication, her stomach rolls/growls/gnaws.  She was constipated for 4-5 days, took generic Dulcolax and finally went on day 5, then has had diarrhea since them.  She has two pancreatic cysts. Her stomach is uncomfortable all day long and she has lost about 3-4 pounds in the last couple of weeks because she just doesn't feel like she can eat much.  Please call patient and advise.  (She would like you to call her today, if possible)  Thank you.

## 2021-01-19 NOTE — Telephone Encounter (Signed)
Patient last seen in May. We should bring her in to discuss these chronic issues. I will try to reach her and work out an appointment that she will be able to keep.

## 2021-01-20 NOTE — Telephone Encounter (Signed)
Called the patient back to discuss. No answer. Left her amessage on the voicemail. Instructed her to call back and ask for an appointment.

## 2021-01-28 ENCOUNTER — Other Ambulatory Visit: Payer: Self-pay

## 2021-01-28 MED ORDER — DICYCLOMINE HCL 10 MG PO CAPS: 10.0000 mg | ORAL_CAPSULE | Freq: Three times a day (TID) | ORAL | 0 refills | Status: DC | PRN

## 2021-01-28 NOTE — Telephone Encounter (Signed)
Prescription sent to patient's preferred pharmacy. Called patient to let her know prescription was sent. Patient instructed not to take levsin while taking bentyl. Pt verbalized understanding.

## 2021-01-28 NOTE — Telephone Encounter (Signed)
Returned patient's call. Patient states she is having abdominal cramping, nausea without vomiting, constipation, and diarrhea after laxative use. She states she went to urgent care for her stomach issues and they prescribed her bentyl and she said that helped her symptoms. Attempted to schedule patient for appt with Dr. Silverio Decamp tomorrow morning, 12/16, but patient said she couldn't make it to an appt that early and wanted to keep her appointment with Carl Best on 02/17/21 at 3 pm. Pt requesting bentyl to get her through until her appointment. Please see note below and advise.

## 2021-01-28 NOTE — Telephone Encounter (Signed)
Ok to send Rx for bentyl 10mg  q8h prn X 90 tabs with no refills. Thanks

## 2021-01-28 NOTE — Telephone Encounter (Signed)
Inbound call from patient following up on phone call. States she is experiencing stomach turns, nausea. Asked if she could be prescribed Bentyl

## 2021-01-30 ENCOUNTER — Telehealth: Payer: Self-pay | Admitting: Pulmonary Disease

## 2021-01-30 ENCOUNTER — Other Ambulatory Visit: Payer: Self-pay | Admitting: Pulmonary Disease

## 2021-01-30 NOTE — Telephone Encounter (Signed)
Patient called the answering service  I did reach out to the patient Last visit to our office was October 2019  Encouraged to call her primary care doctor  May call our office to reestablish follow-up

## 2021-02-03 ENCOUNTER — Ambulatory Visit: Payer: Medicare Other

## 2021-02-08 ENCOUNTER — Other Ambulatory Visit: Payer: Self-pay | Admitting: Gastroenterology

## 2021-02-17 ENCOUNTER — Ambulatory Visit: Payer: Medicare Other | Admitting: Nurse Practitioner

## 2021-02-22 DIAGNOSIS — K140 Glossitis: Secondary | ICD-10-CM | POA: Diagnosis not present

## 2021-02-22 DIAGNOSIS — N390 Urinary tract infection, site not specified: Secondary | ICD-10-CM | POA: Diagnosis not present

## 2021-02-24 ENCOUNTER — Other Ambulatory Visit: Payer: Self-pay | Admitting: Gastroenterology

## 2021-03-02 ENCOUNTER — Other Ambulatory Visit: Payer: Self-pay | Admitting: Gastroenterology

## 2021-03-04 ENCOUNTER — Encounter: Payer: Self-pay | Admitting: Nurse Practitioner

## 2021-03-04 ENCOUNTER — Ambulatory Visit (INDEPENDENT_AMBULATORY_CARE_PROVIDER_SITE_OTHER): Payer: Medicare Other | Admitting: Nurse Practitioner

## 2021-03-04 VITALS — BP 126/70 | HR 80 | Ht 62.0 in | Wt 136.5 lb

## 2021-03-04 DIAGNOSIS — R1030 Lower abdominal pain, unspecified: Secondary | ICD-10-CM

## 2021-03-04 DIAGNOSIS — K5900 Constipation, unspecified: Secondary | ICD-10-CM

## 2021-03-04 NOTE — Patient Instructions (Signed)
RECOMMENDATIONS:  Miralax- Dissolve one capful in 8 ounces of water and drink before bed. IBgard 1 capsule twice a day as needed for. Follow up with Dr. Silverio Decamp in 3 months.  It was great seeing you today! Thank you for entrusting me with your care and choosing [facility].  Noralyn Pick, CRNP  The [facility] providers would like to encourage you to use [facility] to communicate with providers for non-urgent requests or questions.  Due to long hold times on the telephone, sending your provider a message by [facility] may be faster and more efficient way to get a response. Please allow 48 business hours for a response.  Please remember that this is for non-urgent requests/questions.  If you are age 79 or older, your body mass index should be between 23-30. Your Body mass index is 24.97 kg/m. If this is out of the aforementioned range listed, please consider follow up with your Primary Care Provider.  If you are age 80 or younger, your body mass index should be between 19-25. Your Body mass index is 24.97 kg/m. If this is out of the aformentioned range listed, please consider follow up with your Primary Care Provider.

## 2021-03-04 NOTE — Progress Notes (Signed)
03/04/2021 [name] [id number] 10-28-1940   Chief Complaint: Constipation, lower abdominal pain  History of Present Illness: [name] is a 81 year old female with a past medical history of a fibromyalgia, depression, pancreatic cyst, GERD and IBS with associated constipation and abdominal pain.    She was last seen by Dr. Silverio Decamp 06/19/2020 for follow-up regarding irritable bowel syndrome and abdominal pain.  At that time, the patient stopped taking Linzess because it resulted in diarrhea.  She was advised to increase it dietary and water intake.  A repeat surveillance colonoscopy 11/2020 was recommended but was not completed.  She underwent abdominal MRI/MRCP with and without contrast 07/28/2020 which showed a 2.2 x 1.3 cm septated cystic lesion in the body tail junction region of the pancreas which has been present since a prior CT scan from 2015, possibly slightly larger but difficult to measure comparatively with the prior CT scans. It is most likely a benign serous cystadenoma per the radiologist.  A surveillance colonoscopy was recommended 11/2020 which was not done.    She presents to our office today for further evaluation regarding chronic constipation and lower abdominal pain.  She is passing a brown formed stool every 3 to 4 days.  She takes Ex-Lax 2 tabs about once weekly.  She stated she was not taking MiraLAX because she was concerned it would harm her kidneys.  No rectal bleeding or black stools.  She describes feeling her upper abdomen is weak, no nausea or vomiting.  She is concerned her stomach makes a lot of noise.  Her lower abdominal pain is similar to her chronic pain but has increased in frequency and is lasting longer over the past few months.  She denies having any lower abdominal pain at this time.  No fevers.  She was recently treated for thrush her PCP or urgent care and she is concerned she still has a white coating on her tongue.  CBC Latest Ref Rng &  Units 06/12/2020 03/25/2020 03/03/2012  WBC 3.4 - 10.8 x10E3/uL 8.1 6.9 7.2  Hemoglobin 11.1 - 15.9 g/dL 13.2 13.4 14.1  Hematocrit 34.0 - 46.6 % 40.9 40.0 40.2  Platelets 150 - 400 K/uL - - 255    CMP Latest Ref Rng & Units 03/25/2020 12/29/2014 05/29/2012  Glucose 65 - 99 mg/dL 109(H) 78 87  BUN 8 - 27 mg/dL 16 17 14   Creatinine 0.57 - 1.00 mg/dL 0.90 0.98 0.8  Sodium 134 - 144 mmol/L 138 138 135  Potassium 3.5 - 5.2 mmol/L 4.2 4.5 3.6  Chloride 96 - 106 mmol/L 102 100 102  CO2 20 - 29 mmol/L 22 26 27   Calcium 8.7 - 10.3 mg/dL 10.3 10.1 9.1  Total Protein 6.0 - 8.5 g/dL 6.6 6.9 -  Total Bilirubin 0.0 - 1.2 mg/dL 0.3 0.5 -  Alkaline Phos 44 - 121 IU/L 81 80 -  AST 0 - 40 IU/L 21 18 -  ALT 0 - 32 IU/L 13 12 -    MRI 07/28/2020: 1. 2.2 x 1.3 cm septated cystic lesion in the body tail junction region of the pancreas. This has been present since a prior CT scan from 2015. It may be slightly larger but difficult to measure comparatively with the prior CT scans. It is most likely a benign serous cystadenoma. 2. Numerous small bilateral renal cysts. 3. No abdominal adenopathy -recall MRI 1 yr   EGD October 2017 was normal .   Colonoscopy October 2017 showed 2  small diminutive tubular adenomas were removed, sigmoid diverticulosis and small internal hemorrhoids  Upper endoscopy by Dr. Cristina Gong in July 2012 was normal.  Colonoscopy in July 2012 was also normal.  Recommended recall in 5 years Colonoscopy in 2003 with sessile serrated polyp    HIDA 07/28/2014 scan was normal with ejection fraction of 80%.  Gastric emptying scan 06/20/2014 was normal with 91% emptying in 2 hours.   Current Outpatient Medications on File Prior to Visit  Medication Sig Dispense Refill   albuterol (VENTOLIN HFA) 108 (90 Base) MCG/ACT inhaler Inhale 1 puff into the lungs as needed.     aspirin EC 81 MG tablet Take 81 mg by mouth daily.     bismuth subsalicylate (PEPTO BISMOL) 262 MG/15ML suspension Take 30 mLs by  mouth every 6 (six) hours as needed.     chlorhexidine (PERIDEX) 0.12 % solution 15 mLs by Mouth Rinse route 2 (two) times daily.     Coenzyme Q10 (CO Q-10) 100 MG CAPS Take 100 mg by mouth daily.     dicyclomine (BENTYL) 10 MG capsule TAKE 1 CAPSULE (10 MG TOTAL) BY MOUTH EVERY 8 (EIGHT) HOURS AS NEEDED FOR UP TO 90 DOSES FOR SPASMS. 90 capsule 0   DULoxetine (CYMBALTA) 30 MG capsule Take 30 mg by mouth daily.     EPINEPHrine 0.3 mg/0.3 mL IJ SOAJ injection Inject 0.3 mg into the muscle as needed.     ergocalciferol (VITAMIN D2) 50000 UNITS capsule Take 50,000 Units by mouth once a week.     famotidine (PEPCID) 20 MG tablet TAKE 1 TABLET 3 TIMES DAILY (BEFORE BREAKFAST AND DINNER AND ANOTHER ONE BEFORE BEDTIME) 90 tablet 0   fluconazole (DIFLUCAN) 150 MG tablet Take 150 mg by mouth daily.     HYDROcodone-acetaminophen (NORCO) 7.5-325 MG per tablet Take 1 tablet by mouth 2 (two) times daily. For pain     hyoscyamine (LEVSIN SL) 0.125 MG SL tablet DISSOLVE 1 TABLET UNDER THE TONGUE EVERY 6 HOURS AS NEEDED 60 tablet 0   ipratropium (ATROVENT) 0.06 % nasal spray Place into both nostrils as needed.     levothyroxine (SYNTHROID, LEVOTHROID) 50 MCG tablet Take 50 mcg by mouth daily before breakfast. Once daily     loratadine (CLARITIN) 10 MG tablet Take 1 tablet (10 mg total) by mouth in the morning and at bedtime. 60 tablet 5   LORazepam (ATIVAN) 1 MG tablet Take 1 tablet by mouth at bedtime.     losartan (COZAAR) 100 MG tablet Take 100 mg by mouth daily.     miconazole (MICOTIN) 2 % cream Apply 1 application topically 2 (two) times daily. 28.35 g 0   Multiple Vitamins-Minerals (PRESERVISION AREDS) TABS Take 1 tablet by mouth daily.     SUMAtriptan (IMITREX) 50 MG tablet Take 50 mg by mouth every 2 (two) hours as needed. For migraine     triamcinolone (KENALOG) 0.025 % ointment Apply 1 application topically 2 (two) times daily. 160 g 6   Current Facility-Administered Medications on File Prior to  Visit  Medication Dose Route Frequency Provider Last Rate Last Admin   omalizumab Arvid Right) injection 300 mg  300 mg Subcutaneous Q28 days Valentina Shaggy, MD   300 mg at 10/09/20 1501   Allergies  Allergen Reactions   Crestor [Rosuvastatin Calcium]     myalgia   Zocor [Simvastatin]     myalgia   Enalapril     swelling   Lamictal [Lamotrigine]     rash   Nitrofuran  Derivatives Nausea Only   Erythromycin Other (See Comments)    Stomach burning   Methylprednisolone     Major depression, weakness, nausea   Nystatin Other (See Comments)    Blisters on her lips   Vivelle [Estradiol]     rash   Zetia [Ezetimibe]     Muscle weakness   Ciprofloxacin Nausea Only   Ciprofloxacin Hcl     rash   Latex Rash   Shellfish Allergy Rash   Sulfa Antibiotics Nausea Only   Sulfasalazine Nausea Only   Current Medications, Allergies, Past Medical History, Past Surgical History, Family History and Social History were reviewed in [employer] record.  Review of Systems:   Constitutional: Negative for fever, sweats, chills or weight loss.  Respiratory: Negative for shortness of breath.   Cardiovascular: Negative for chest pain, palpitations and leg swelling.  Gastrointestinal: See HPI.  Musculoskeletal: Negative for back pain or muscle aches.  Neurological: Negative for dizziness, headaches or paresthesias.    Physical Exam: BP 126/70 (BP Location: Left Arm, Patient Position: Sitting, Cuff Size: Normal)    Pulse 80    Ht 5\' 2"  (1.575 m) Comment: height measured without shoes   Wt 136 lb 8 oz (61.9 kg)    BMI 24.97 kg/m  General: 81 year old female in no acute distress. Head: Normocephalic and atraumatic. Eyes: No scleral icterus. Conjunctiva pink . Ears: Normal auditory acuity. Mouth: Dentition intact. No ulcers or lesions.  Very slight white coating on tongue. Lungs: Clear throughout to auscultation. Heart: Regular rate and rhythm, no murmur. Abdomen: Soft,  nontender and nondistended. No masses or hepatomegaly. Normal bowel sounds x 4 quadrants.  Central lower abdominal/pelvic horizontal scar intact with very mild thickening. Rectal: Deferred. Musculoskeletal: Symmetrical with no gross deformities. Extremities: No edema. Neurological: Alert oriented x 4. No focal deficits.  Psychological: Alert and cooperative. Normal mood and affect  Assessment and Recommendations:  15) 81 year old female with constipation predominant IBS with chronic lower abdominal pain.  She is passing a bowel movement every 3 to 4 days.  Intolerant to Linzess (developed undesirable diarrhea on it).  No abdominal pain at this time.  Abdominal exam today without tenderness.  Her chronic lower abdominal pain is likely exacerbated by her constipation and she may have adhesions from her past hysterectomy surgery. -MiraLAX nightly as tolerated, reassured the patient MiraLAX would not harm her kidneys -IBgard 1 p.o. twice daily as needed for abdominal pain -Patient to contact office if her symptoms worsen  2) 2 small diminutive tubular adenomatous polyps removed per colonoscopy 11/2015.  Recall colonoscopy was due 11/2020 per Dr. Silverio Decamp. -Patient does not wish to schedule a colonoscopy at this time, she will discuss further with Dr. Silverio Decamp at the time of her next follow-up appointment in 2 to 3 months  3) GERD, asymptomatic at this time -Continue Famotidine 20 mg when to twice daily  4) History of a pancreatic tail cyst which measured 2.2 x 1.3 cm per abdominal MRI 07/28/2020. -Abdominal MRI/MRCP due 07/2021  5) Recently treated for oral thrush with Diflucan, she continues to have a very faint white coating on her tongue -Follow-up with PCP for further management

## 2021-03-07 ENCOUNTER — Other Ambulatory Visit: Payer: Self-pay | Admitting: Gastroenterology

## 2021-03-09 NOTE — Progress Notes (Signed)
Reviewed and agree with documentation and assessment and plan. K. Veena Rosamae Rocque , MD   

## 2021-03-11 ENCOUNTER — Telehealth: Payer: Self-pay | Admitting: *Deleted

## 2021-03-11 NOTE — Telephone Encounter (Signed)
Dicyclomine approved from 02/14/2021-03/02/2021   Sent approval to be scanned in

## 2021-03-16 ENCOUNTER — Telehealth: Payer: Self-pay

## 2021-03-16 NOTE — Telephone Encounter (Signed)
Patient called stating she received a bill for $474.00. She states that the way it looks as if we didn't file her secondary insurance.  Could someone look into this and contact the patient.   Thanks

## 2021-03-17 DIAGNOSIS — I700 Atherosclerosis of aorta: Secondary | ICD-10-CM | POA: Diagnosis not present

## 2021-03-17 DIAGNOSIS — I129 Hypertensive chronic kidney disease with stage 1 through stage 4 chronic kidney disease, or unspecified chronic kidney disease: Secondary | ICD-10-CM | POA: Diagnosis not present

## 2021-03-17 DIAGNOSIS — E039 Hypothyroidism, unspecified: Secondary | ICD-10-CM | POA: Diagnosis not present

## 2021-03-17 DIAGNOSIS — N1831 Chronic kidney disease, stage 3a: Secondary | ICD-10-CM | POA: Diagnosis not present

## 2021-03-17 DIAGNOSIS — R350 Frequency of micturition: Secondary | ICD-10-CM | POA: Diagnosis not present

## 2021-03-17 DIAGNOSIS — G894 Chronic pain syndrome: Secondary | ICD-10-CM | POA: Diagnosis not present

## 2021-03-17 DIAGNOSIS — E7800 Pure hypercholesterolemia, unspecified: Secondary | ICD-10-CM | POA: Diagnosis not present

## 2021-03-29 ENCOUNTER — Other Ambulatory Visit: Payer: Self-pay | Admitting: Gastroenterology

## 2021-04-01 ENCOUNTER — Ambulatory Visit: Payer: Medicare Other

## 2021-04-01 ENCOUNTER — Telehealth: Payer: Self-pay | Admitting: Gastroenterology

## 2021-04-01 NOTE — Telephone Encounter (Signed)
Patient having stomach issues and asked for you to call her.  Thank you.

## 2021-04-01 NOTE — Telephone Encounter (Signed)
Called by patient on 2 occasions this evening.  She has not had a bowel movement in 6 days.  She has only started taking stool softeners.  She is having some abdominal discomfort.  I asked her to go ahead and initiate oral MiraLAX 1-2 doses this evening.  After 2 to 3 hours if she has not had a bowel movement she may use 10 mg of bisacodyl p.o.  And then she should initiate MiraLAX on a daily basis.  I will forward this to Dr. Silverio Decamp and her team so that they can check in with her tomorrow (Friday).  Patient appreciative for the call back and discussion.

## 2021-04-02 NOTE — Telephone Encounter (Signed)
Spoke with the patient. She took Miralax and then took Ex-lax. She did not have any Dulcolax. She did begin passing stool in small amounts. She describes it as hard stool "river rocks." She is a late night person and my phone call woke her up. She agrees if she does not begin to empty, she will go purchase the Dulcolax and take as instructed. Denies abdominal pain, fever or nausea.

## 2021-04-07 ENCOUNTER — Other Ambulatory Visit: Payer: Self-pay | Admitting: Allergy & Immunology

## 2021-04-07 ENCOUNTER — Ambulatory Visit
Admission: RE | Admit: 2021-04-07 | Discharge: 2021-04-07 | Disposition: A | Discharge status: Home / Self Care | Payer: Medicare Other | Source: Ambulatory Visit | Attending: Geriatric Medicine | Admitting: Geriatric Medicine

## 2021-04-07 DIAGNOSIS — Z1231 Encounter for screening mammogram for malignant neoplasm of breast: Secondary | ICD-10-CM

## 2021-04-09 NOTE — Telephone Encounter (Signed)
Inbound call from pt requesting a call back stating that she is still not able to make a BM. Pt stated that she did as instructed from before and nothing is helping at the moment. Please advise. Thank you.

## 2021-04-12 NOTE — Telephone Encounter (Signed)
Patient called after hours, she has been escalating doses of laxatives at home and not having good output.  She did have a bowel movement yesterday but felt incompletely evacuated.  Has been using a variety of laxatives over-the-counter.  She feels she needs something stronger to make her move tonight.  Discussed options, recommend half a MiraLAX bowel prep to purge her bowel a bit and see if that will help decompress her stomach.  She does not sound impacted based on description of symptoms.  Discussed how to do half MiraLAX prep and she will try that tonight.  She can use MiraLAX a few times daily moving forward to see if that will work for her, she would like a call back by Dr. Silverio Decamp to discuss long-term regimen.  [name]

## 2021-04-12 NOTE — Telephone Encounter (Signed)
Patient states she is passing "stringy" stool now. She reports she is straining very hard to pass her stool. She does not drink Miralax daily "because of my chronic kidney disease." She did drink 2 glasses of Miralax yesterday and she took either bisacodyl or Senokot as well. What do you recommend at this point?

## 2021-04-13 NOTE — Telephone Encounter (Signed)
Beth, please schedule office visit to discuss concerns and management of chronic IBS constipation.  Thank you

## 2021-04-13 NOTE — Telephone Encounter (Signed)
Spoke with the patient. She reports she did not take Miralax last night. She felt full from supper and was concerned she could not drink all the fluid. She instead took 4 Dulcolax. She reports she is having lots of bowel movements now.  Appointment scheduled with the patient for 05/14/21 at 3:30 pm.

## 2021-04-13 NOTE — Telephone Encounter (Signed)
Unfortunately patient doesn't pick up the phone during office hours and always calls after hours . We will try to bring her in for office visit soon to manage her chronic constipation. Thanks

## 2021-04-20 ENCOUNTER — Telehealth: Payer: Self-pay | Admitting: Nurse Practitioner

## 2021-04-20 NOTE — Telephone Encounter (Signed)
Inbound call from patient requesting a call from a nurse please in regards to Pepcid medication.  States she should be taking 3 not 2 a day.  Please advise. ?

## 2021-04-21 NOTE — Telephone Encounter (Signed)
Ammie,I typically do not prescribe famotidine 3 times daily, this medication is usually twice daily.  Please send Rx for famotidine 40 mg p.o. twice daily # 60, 1 refill and let her know this is a higher dose to be taken twice daily and for her to let me know if her symptoms persist on this regimen.  Thank you ?

## 2021-04-21 NOTE — Telephone Encounter (Signed)
Can I put it back to famotidine 20 mg TID? She feels she needs it 3 times a day. ?

## 2021-04-22 ENCOUNTER — Other Ambulatory Visit: Payer: Self-pay

## 2021-04-22 DIAGNOSIS — K219 Gastro-esophageal reflux disease without esophagitis: Secondary | ICD-10-CM

## 2021-04-22 MED ORDER — FAMOTIDINE 20 MG PO TABS: 20.0000 mg | ORAL_TABLET | Freq: Two times a day (BID) | ORAL | 1 refills | Status: DC

## 2021-04-22 NOTE — Telephone Encounter (Signed)
Review of pt chart does indicate Dr. Silverio Decamp had prescribed Pepcid TID in the past. Realizing and understanding Carl Best, NP's concern, given pt request and Rx history, routing this message to Dr. Silverio Decamp to determine if she is okay with either Rx'ing BID or TID. Will await her response. ?

## 2021-04-22 NOTE — Telephone Encounter (Signed)
Rx sent to pt preferred pharmacy per Dr. Lewie Loron and Carl Best, NP's orders. Called pt to make her aware of their response. LVM requesting returned call. ?

## 2021-04-22 NOTE — Telephone Encounter (Signed)
She can use it TID PRN on a day she has severe heartburn but not on regular basis, if she is using regularly switch it to BID. Thanks ?

## 2021-04-23 NOTE — Telephone Encounter (Signed)
SECOND ATTEMPT: ? ?Informed pt of Dr. Wynelle Bourgeois and Carl Best, NP's response as documented below. Verbalized acceptance and understanding. ?

## 2021-04-27 DIAGNOSIS — U071 COVID-19: Secondary | ICD-10-CM | POA: Diagnosis not present

## 2021-05-05 ENCOUNTER — Institutional Professional Consult (permissible substitution): Payer: Medicare Other | Admitting: Pulmonary Disease

## 2021-05-14 ENCOUNTER — Ambulatory Visit: Payer: Medicare Other | Admitting: Gastroenterology

## 2021-05-19 DIAGNOSIS — I129 Hypertensive chronic kidney disease with stage 1 through stage 4 chronic kidney disease, or unspecified chronic kidney disease: Secondary | ICD-10-CM | POA: Diagnosis not present

## 2021-05-19 DIAGNOSIS — Z79899 Other long term (current) drug therapy: Secondary | ICD-10-CM | POA: Diagnosis not present

## 2021-05-19 DIAGNOSIS — E039 Hypothyroidism, unspecified: Secondary | ICD-10-CM | POA: Diagnosis not present

## 2021-05-19 DIAGNOSIS — N1831 Chronic kidney disease, stage 3a: Secondary | ICD-10-CM | POA: Diagnosis not present

## 2021-05-19 DIAGNOSIS — E7800 Pure hypercholesterolemia, unspecified: Secondary | ICD-10-CM | POA: Diagnosis not present

## 2021-05-27 ENCOUNTER — Telehealth: Payer: Self-pay | Admitting: Neurology

## 2021-05-27 ENCOUNTER — Ambulatory Visit: Payer: Medicare Other | Admitting: Neurology

## 2021-05-27 NOTE — Progress Notes (Deleted)
? ? ?Patient: [name] ?Date of Birth: May 11, 1940 ? ?Reason for Visit: Follow up for tremor ?History from: Patient ?Primary Neurologist: Dr. Brett Fairy ? ? ?ASSESSMENT AND PLAN ?81 y.o. year old female  ? ?1.  Tremor, action type ? ? ?HISTORY OF PRESENT ILLNESS: ?Today 05/27/21 ?[name] here today for follow-up. ? ?HISTORY  ?11/26/2020 Dr. Jannifer Franklin: [name] is a 81 year old right-handed white female with a history of a tremor.  She has occasional headaches, she has diffuse neuromuscular discomfort and may have fibromyalgia.  She does report a mild memory problem.  She has significant issues with depression, she cares for her husband who is having a lot of medical issues.  Husband may be getting demented with confusion and urinary incontinence.  The patient came off of Diamox for her tremor due to some stomach upset.  The tremor seems to be a bit worse in the morning.  She has noted some changes in her handwriting but she is still able to generate handwriting fairly well.  There is no resting component to the tremor ? ?REVIEW OF SYSTEMS: Out of a complete 14 system review of symptoms, the patient complains only of the following symptoms, and all other reviewed systems are negative. ? ?See HPI ? ?ALLERGIES: ?Allergies  ?Allergen Reactions  ? Crestor [Rosuvastatin Calcium]   ?  myalgia  ? Zocor [Simvastatin]   ?  myalgia  ? Enalapril   ?  swelling  ? Lamictal [Lamotrigine]   ?  rash  ? Nitrofuran Derivatives Nausea Only  ? Erythromycin Other (See Comments)  ?  Stomach burning  ? Methylprednisolone   ?  Major depression, weakness, nausea  ? Nystatin Other (See Comments)  ?  Blisters on her lips  ? Vivelle [Estradiol]   ?  rash  ? Zetia [Ezetimibe]   ?  Muscle weakness  ? Ciprofloxacin Nausea Only  ? Ciprofloxacin Hcl   ?  rash  ? Latex Rash  ? Shellfish Allergy Rash  ? Sulfa Antibiotics Nausea Only  ? Sulfasalazine Nausea Only  ? ? ?HOME MEDICATIONS: ?Outpatient Medications Prior to Visit  ?Medication Sig  Dispense Refill  ? albuterol (VENTOLIN HFA) 108 (90 Base) MCG/ACT inhaler Inhale 1 puff into the lungs as needed.    ? aspirin EC 81 MG tablet Take 81 mg by mouth daily.    ? bismuth subsalicylate (PEPTO BISMOL) 262 MG/15ML suspension Take 30 mLs by mouth every 6 (six) hours as needed.    ? chlorhexidine (PERIDEX) 0.12 % solution 15 mLs by Mouth Rinse route 2 (two) times daily.    ? Coenzyme Q10 (CO Q-10) 100 MG CAPS Take 100 mg by mouth daily.    ? dicyclomine (BENTYL) 10 MG capsule TAKE 1 CAPSULE (10 MG TOTAL) BY MOUTH EVERY 8 (EIGHT) HOURS AS NEEDED FOR UP TO 90 DOSES FOR SPASMS. 90 capsule 0  ? DULoxetine (CYMBALTA) 30 MG capsule Take 30 mg by mouth daily.    ? EPINEPHrine 0.3 mg/0.3 mL IJ SOAJ injection Inject 0.3 mg into the muscle as needed.    ? ergocalciferol (VITAMIN D2) 50000 UNITS capsule Take 50,000 Units by mouth once a week.    ? famotidine (PEPCID) 20 MG tablet Take 1 tablet (20 mg total) by mouth 2 (two) times daily. 60 tablet 1  ? fluconazole (DIFLUCAN) 150 MG tablet Take 150 mg by mouth daily.    ? HYDROcodone-acetaminophen (NORCO) 7.5-325 MG per tablet Take 1 tablet by mouth 2 (two) times daily. For pain    ?  hyoscyamine (LEVSIN SL) 0.125 MG SL tablet DISSOLVE 1 TABLET UNDER THE TONGUE EVERY 6 HOURS AS NEEDED 60 tablet 0  ? ipratropium (ATROVENT) 0.06 % nasal spray Place into both nostrils as needed.    ? levothyroxine (SYNTHROID, LEVOTHROID) 50 MCG tablet Take 50 mcg by mouth daily before breakfast. Once daily    ? loratadine (CLARITIN) 10 MG tablet TAKE 1 TABLET (10 MG TOTAL) BY MOUTH IN THE MORNING AND AT BEDTIME. 60 tablet 5  ? LORazepam (ATIVAN) 1 MG tablet Take 1 tablet by mouth at bedtime.    ? losartan (COZAAR) 100 MG tablet Take 100 mg by mouth daily.    ? miconazole (MICOTIN) 2 % cream Apply 1 application topically 2 (two) times daily. 28.35 g 0  ? Multiple Vitamins-Minerals (PRESERVISION AREDS) TABS Take 1 tablet by mouth daily.    ? SUMAtriptan (IMITREX) 50 MG tablet Take 50 mg by  mouth every 2 (two) hours as needed. For migraine    ? triamcinolone (KENALOG) 0.025 % ointment Apply 1 application topically 2 (two) times daily. 160 g 6  ? ?Facility-Administered Medications Prior to Visit  ?Medication Dose Route Frequency Provider Last Rate Last Admin  ? omalizumab Arvid Right) injection 300 mg  300 mg Subcutaneous Q28 days Valentina Shaggy, MD   300 mg at 10/09/20 1501  ? ? ?PAST MEDICAL HISTORY: ?Past Medical History:  ?Diagnosis Date  ? ADD (attention deficit disorder)   ? Anxiety   ? Arthritis   ? Asthma   ? Chronic kidney disease   ? CKD Stage 3  ? Chronic renal insufficiency, stage III (moderate) (HCC)   ? COVID-19   ? Depression   ? major depression  ? Dermatitis   ? Diverticulosis   ? Essential tremor 05/17/2012  ? Fibromyalgia   ? GERD (gastroesophageal reflux disease)   ? Hyperlipidemia   ? Hypertension   ? Memory loss 05/17/2012  ? Migraines   ? Occasional tremors   ? Pneumonia   ? TIA (transient ischemic attack)   ? History of 2 TIA's per pt  ? Tremor   ? ? ?PAST SURGICAL HISTORY: ?Past Surgical History:  ?Procedure Laterality Date  ? ABDOMINAL HYSTERECTOMY    ? APPENDECTOMY    ? CATARACT EXTRACTION Bilateral   ? TONSILLECTOMY AND ADENOIDECTOMY    ? age 34  ? TOTAL HIP ARTHROPLASTY Bilateral 2009  ? Dr Adriana Mccallum  ? ? ?FAMILY HISTORY: ?Family History  ?Problem Relation Age of Onset  ? Liver cancer Mother   ? Pulmonary embolism Mother   ? Prostate cancer Father   ? Stroke Maternal Grandmother   ? Heart failure Maternal Grandfather   ? Emphysema Maternal Grandfather   ? Congestive Heart Failure Maternal Grandfather   ? Coronary artery disease Paternal Grandmother   ? Coronary artery disease Paternal Grandfather   ? Prostate cancer Paternal Uncle   ?     x 2  ? ALS Paternal Aunt   ? Brain cancer Paternal Aunt   ? Breast cancer Paternal Aunt   ? Lung cancer Maternal Aunt   ? Depression Brother   ?     suicide  ? Diabetes Paternal Aunt   ?     x 2  ? Breast cancer Cousin   ? Colon cancer  Neg Hx   ? Esophageal cancer Neg Hx   ? Rectal cancer Neg Hx   ? Stomach cancer Neg Hx   ? ? ?SOCIAL HISTORY: ?Social History  ? ?Socioeconomic  History  ? Marital status: Married  ?  Spouse name: Not on file  ? Number of children: 2  ? Years of education: 29  ? Highest education level: Not on file  ?Occupational History  ? Occupation: Retired  ?  Employer: RETIRED  ?Tobacco Use  ? Smoking status: Never  ? Smokeless tobacco: Never  ?Vaping Use  ? Vaping Use: Never used  ?Substance and Sexual Activity  ? Alcohol use: No  ?  Alcohol/week: 0.0 standard drinks  ? Drug use: No  ? Sexual activity: Never  ?Other Topics Concern  ? Not on file  ?Social History Narrative  ? Patient lives at home with her husband [name]).   ? Retired.  ? Education high school.  ? Right handed.  ? Caffeine two cups of coffee daily and one glass of sweet tea.  ? ?Social Determinants of Health  ? ?Financial Resource Strain: Not on file  ?Food Insecurity: Not on file  ?Transportation Needs: Not on file  ?Physical Activity: Not on file  ?Stress: Not on file  ?Social Connections: Not on file  ?Intimate Partner Violence: Not on file  ? ? ?PHYSICAL EXAM ? ?There were no vitals filed for this visit. ?There is no height or weight on file to calculate BMI. ? ?Generalized: Well developed, in no acute distress  ?Neurological examination  ?Mentation: Alert oriented to time, place, history taking. Follows all commands speech and language fluent ?Cranial nerve II-XII: Pupils were equal round reactive to light. Extraocular movements were full, visual field were full on confrontational test. Facial sensation and strength were normal. Uvula tongue midline. Head turning and shoulder shrug  were normal and symmetric. ?Motor: The motor testing reveals 5 over 5 strength of all 4 extremities. Good symmetric motor tone is noted throughout.  ?Sensory: Sensory testing is intact to soft touch on all 4 extremities. No evidence of extinction is noted.  ?Coordination:  Cerebellar testing reveals good finger-nose-finger and heel-to-shin bilaterally.  ?Gait and station: Gait is normal. Tandem gait is normal. Romberg is negative. No drift is seen.  ?Reflexes: Deep tendon reflexe

## 2021-05-27 NOTE — Telephone Encounter (Signed)
Pt cancelled appt due to husband is sick and cannot bring pt to appt. Pt said will have to call back to reschedule because have so many appts already. ?

## 2021-05-27 NOTE — Telephone Encounter (Signed)
Noted thank you

## 2021-06-08 DIAGNOSIS — M4000 Postural kyphosis, site unspecified: Secondary | ICD-10-CM | POA: Diagnosis not present

## 2021-06-08 DIAGNOSIS — M549 Dorsalgia, unspecified: Secondary | ICD-10-CM | POA: Diagnosis not present

## 2021-06-11 DIAGNOSIS — N1831 Chronic kidney disease, stage 3a: Secondary | ICD-10-CM | POA: Insufficient documentation

## 2021-06-14 ENCOUNTER — Ambulatory Visit (INDEPENDENT_AMBULATORY_CARE_PROVIDER_SITE_OTHER): Payer: Medicare Other | Admitting: Nurse Practitioner

## 2021-06-14 ENCOUNTER — Encounter: Payer: Self-pay | Admitting: Nurse Practitioner

## 2021-06-14 VITALS — BP 126/80 | HR 78 | Ht 64.0 in | Wt 136.6 lb

## 2021-06-14 DIAGNOSIS — K862 Cyst of pancreas: Secondary | ICD-10-CM | POA: Diagnosis not present

## 2021-06-14 NOTE — Progress Notes (Signed)
? ? ? ?06/14/2021 ?SHENEIKA WALSTAD ?[id number] ?Jan 10, 1941 ? ? ?Chief Complaint: Constipation, heartburn  ? ?History of Present Illness: [name]. [place] is a 81 year old female with a past medical history of a fibromyalgia, depression, pancreatic cyst, GERD and IBS with associated constipation and abdominal pain.   ? ?She presents today for follow-up however regarding constipation and heartburn.  She previously stopped taking Linzess as it resulted in diarrhea.  She reported taking Duca locks 2 tabs for 2 consecutive nights and the third night she took 3 Dulcolax tablets and a stool softener.  She awakened early the next morning with moderate RLQ pain which resulted in staying in bed for the next 3 hours.  She does not recall if she had a bowel movement that day or the next day but denied having an excessive bowel cleanout.  She remains fairly constipated.  She passes River rocklike stools every few days.  She can go 7 to 8 days without passing a bowel movement.  She was having increased heartburn symptoms which abated after she took famotidine 20 mg 3 times daily.  She has decreased famotidine to twice daily.  No current heartburn.  No dysphagia. ? ?She underwent abdominal MRI/MRCP with and without contrast 07/28/2020 which showed a 2.2 x 1.3 cm septated cystic lesion in the body tail junction region of the pancreas which has been present since a prior CT scan from 2015, possibly slightly larger but difficult to measure comparatively with the prior CT scans. It is most likely a benign serous cystadenoma per the radiologist.  She was advised to repeat an abdominal MRI/MRCP 07/2021. ? ?Past GI procedures: ? ?Colonoscopy October 2017 showed 2 small diminutive tubular adenomas were removed, sigmoid diverticulosis and small internal hemorrhoids  ?Upper endoscopy by Dr. Cristina Gong in July 2012 was normal.  ?Colonoscopy in July 2012 was also normal.  Recommended recall in 5 years ?Colonoscopy in 2003 with sessile serrated  polyp  ? ?Past image studies: ?  ?HIDA 07/28/2014 scan was normal with ejection fraction of 80%.  ?Gastric emptying scan 06/20/2014 was normal with 91% emptying in 2 hours.  ? ?Current Outpatient Medications on File Prior to Visit  ?Medication Sig Dispense Refill  ? albuterol (VENTOLIN HFA) 108 (90 Base) MCG/ACT inhaler Inhale 1 puff into the lungs as needed.    ? aspirin EC 81 MG tablet Take 81 mg by mouth daily.    ? benzonatate (TESSALON) 200 MG capsule Take 200 mg by mouth 3 (three) times daily as needed.    ? bismuth subsalicylate (PEPTO BISMOL) 262 MG/15ML suspension Take 30 mLs by mouth every 6 (six) hours as needed.    ? chlorhexidine (PERIDEX) 0.12 % solution 15 mLs by Mouth Rinse route 2 (two) times daily.    ? Coenzyme Q10 (CO Q-10) 100 MG CAPS Take 100 mg by mouth daily.    ? dicyclomine (BENTYL) 10 MG capsule TAKE 1 CAPSULE (10 MG TOTAL) BY MOUTH EVERY 8 (EIGHT) HOURS AS NEEDED FOR UP TO 90 DOSES FOR SPASMS. 90 capsule 0  ? DULoxetine (CYMBALTA) 30 MG capsule Take 30 mg by mouth daily.    ? EPINEPHrine 0.3 mg/0.3 mL IJ SOAJ injection Inject 0.3 mg into the muscle as needed.    ? ergocalciferol (VITAMIN D2) 50000 UNITS capsule Take 50,000 Units by mouth once a week.    ? famotidine (PEPCID) 20 MG tablet Take 1 tablet (20 mg total) by mouth 2 (two) times daily. 60 tablet 1  ? fluconazole (  DIFLUCAN) 150 MG tablet Take 150 mg by mouth daily.    ? HYDROcodone-acetaminophen (NORCO) 7.5-325 MG per tablet Take 1 tablet by mouth 2 (two) times daily. For pain    ? hyoscyamine (LEVSIN SL) 0.125 MG SL tablet DISSOLVE 1 TABLET UNDER THE TONGUE EVERY 6 HOURS AS NEEDED 60 tablet 0  ? ipratropium (ATROVENT) 0.06 % nasal spray Place into both nostrils as needed.    ? levothyroxine (SYNTHROID, LEVOTHROID) 50 MCG tablet Take 50 mcg by mouth daily before breakfast. Once daily    ? loratadine (CLARITIN) 10 MG tablet TAKE 1 TABLET (10 MG TOTAL) BY MOUTH IN THE MORNING AND AT BEDTIME. 60 tablet 5  ? LORazepam (ATIVAN) 1 MG  tablet Take 1 tablet by mouth at bedtime.    ? losartan (COZAAR) 100 MG tablet Take 100 mg by mouth daily.    ? miconazole (MICOTIN) 2 % cream Apply 1 application topically 2 (two) times daily. 28.35 g 0  ? Multiple Vitamins-Minerals (PRESERVISION AREDS) TABS Take 1 tablet by mouth daily.    ? SUMAtriptan (IMITREX) 50 MG tablet Take 50 mg by mouth every 2 (two) hours as needed. For migraine    ? triamcinolone (KENALOG) 0.025 % ointment Apply 1 application topically 2 (two) times daily. 160 g 6  ? ?Current Facility-Administered Medications on File Prior to Visit  ?Medication Dose Route Frequency Provider Last Rate Last Admin  ? omalizumab Arvid Right) injection 300 mg  300 mg Subcutaneous Q28 days Valentina Shaggy, MD   300 mg at 10/09/20 1501  ? ?Allergies  ?Allergen Reactions  ? Crestor [Rosuvastatin Calcium]   ?  myalgia  ? Zocor [Simvastatin]   ?  myalgia  ? Enalapril   ?  swelling  ? Lamictal [Lamotrigine]   ?  rash  ? Nitrofuran Derivatives Nausea Only  ? Erythromycin Other (See Comments)  ?  Stomach burning  ? Methylprednisolone   ?  Major depression, weakness, nausea  ? Nystatin Other (See Comments)  ?  Blisters on her lips  ? Vivelle [Estradiol]   ?  rash  ? Zetia [Ezetimibe]   ?  Muscle weakness  ? Ciprofloxacin Nausea Only  ? Ciprofloxacin Hcl   ?  rash  ? Latex Rash  ? Shellfish Allergy Rash  ? Sulfa Antibiotics Nausea Only  ? Sulfasalazine Nausea Only  ? ? ?Current Medications, Allergies, Past Medical History, Past Surgical History, Family History and Social History were reviewed in [employer] record. ? ?Review of Systems:   ?Constitutional: Negative for fever, sweats, chills or weight loss.  ?Respiratory: Negative for shortness of breath.  - ?Cardiovascular: Negative for chest pain, palpitations and leg swelling.  ?Gastrointestinal: See HPI.  ?Musculoskeletal: Negative for back pain or muscle aches.  ?Neurological: Negative for dizziness, headaches or paresthesias.  ? ?Physical  Exam: ?BP 126/80   Pulse 78   Ht '5\' 4"'$  (1.626 m)   Wt 136 lb 9.6 oz (62 kg)   SpO2 98%   BMI 23.45 kg/m?  ? ?Wt Readings from Last 3 Encounters:  ?06/14/21 136 lb 9.6 oz (62 kg)  ?03/04/21 136 lb 8 oz (61.9 kg)  ?11/26/20 139 lb (63 kg)  ?  ?General: 81 year old female no acute distress ?Head: Normocephalic and atraumatic. ?Eyes: No scleral icterus. Conjunctiva pink . ?Ears: Normal auditory acuity. ?Mouth: Dentition intact. No ulcers or lesions.  ?Lungs: Clear throughout to auscultation. ?Heart: Regular rate and rhythm, no murmur. ?Abdomen: Soft, nontender and nondistended. No masses or hepatomegaly. Normal  bowel sounds x 4 quadrants.  ?Rectal: Deferred. ?Musculoskeletal: Symmetrical with no gross deformities. ?Extremities: No edema. ?Neurological: Alert oriented x 4. No focal deficits.  ?Psychological: Alert and cooperative. Normal mood and affect ? ?Assessment and Recommendations: ? ? ?68) 81 year old female with constipation, RLQ pain after she took Dulcolax for 3 consecutive nights.  No abdominal pain at this time ?-Prune lax 1-2 tabs every third night as needed ?-IBgard 1 p.o. twice daily as needed for abdominal pain ?-Patient to contact office if her symptoms worsen ?  ?2) History of two small diminutive tubular adenomatous polyps removed per colonoscopy 11/2015.  Recall colonoscopy was due 11/2020 per Dr. Silverio Decamp. ?-Patient did not wish to schedule a colonoscopy at the time of her last office follow-up appointment.  Patient to schedule follow-up appointment with Dr. Silverio Decamp July 2023 to further discuss if any further colonoscopies should be done ?  ?3) GERD ?-Continue Famotidine 20 mg to twice daily ?  ?4) History of a pancreatic tail cyst which measured 2.2 x 1.3 cm per abdominal MRI 07/28/2020. ?-Abdominal MRI/MRCP due 07/2021 ?-BMP to be done 1 week prior to abdominal MRI/MRCP date ?  ? ?

## 2021-06-14 NOTE — Patient Instructions (Signed)
You are due for your repeat MRI July 28 2021. ? ?You will be contacted by [facility] (Your caller ID will indicate phone # [phone]) in the next 7 days to schedule your MRI. If you have not heard from them within 7 business days, please call [facility] at [phone] to follow up on the status of your appointment.   ? ?Once your MRI is scheduled, please stop by the lab 1 week before the MRI to have blood work done. ? ?Follow up with Dr. Silverio Decamp in July. ? ?Continue Famotidine 20 mg twice a day. ? ?Prunelax 1-2 tablet every third night as needed. ? ?Thank you for trusting me with your gastrointestinal care!   ? ?Noralyn Pick, CRNP ? ? ? ?BMI: ? ?If you are age 60 or older, your body mass index should be between 23-30. Your Body mass index is 23.45 kg/m?Marland Kitchen If this is out of the aforementioned range listed, please consider follow up with your Primary Care Provider. ? ?If you are age 63 or younger, your body mass index should be between 19-25. Your Body mass index is 23.45 kg/m?Marland Kitchen If this is out of the aformentioned range listed, please consider follow up with your Primary Care Provider.  ? ?MY CHART: ? ?The [facility] providers would like to encourage you to use [facility] to communicate with providers for non-urgent requests or questions.  Due to long hold times on the telephone, sending your provider a message by [facility] may be a faster and more efficient way to get a response.  Please allow 48 business hours for a response.  Please remember that this is for non-urgent requests.  ? ?

## 2021-06-16 ENCOUNTER — Telehealth: Payer: Self-pay | Admitting: Nurse Practitioner

## 2021-06-16 DIAGNOSIS — K862 Cyst of pancreas: Secondary | ICD-10-CM

## 2021-06-16 NOTE — Telephone Encounter (Signed)
Inbound call from patient stating she was not able to schedule her appointment with Radiology. Patient is requesting a call back to discuss. Please advise.  ?

## 2021-06-18 NOTE — Telephone Encounter (Signed)
Spoke with patient, she wants it done at [facility] on [address]. Will check to see if they need the order faxed before scheduling. ?

## 2021-06-24 NOTE — Telephone Encounter (Signed)
New order placed for [name], patient notified she can call them and set up her appointment. ?

## 2021-06-25 DIAGNOSIS — N3091 Cystitis, unspecified with hematuria: Secondary | ICD-10-CM | POA: Diagnosis not present

## 2021-06-25 DIAGNOSIS — M549 Dorsalgia, unspecified: Secondary | ICD-10-CM | POA: Diagnosis not present

## 2021-06-25 DIAGNOSIS — R350 Frequency of micturition: Secondary | ICD-10-CM | POA: Diagnosis not present

## 2021-06-25 DIAGNOSIS — R3915 Urgency of urination: Secondary | ICD-10-CM | POA: Diagnosis not present

## 2021-06-25 DIAGNOSIS — R300 Dysuria: Secondary | ICD-10-CM | POA: Diagnosis not present

## 2021-07-08 NOTE — Progress Notes (Signed)
Reviewed and agree with documentation and assessment and plan. K. Veena Saturnino Liew , MD   

## 2021-07-13 ENCOUNTER — Other Ambulatory Visit (INDEPENDENT_AMBULATORY_CARE_PROVIDER_SITE_OTHER): Payer: Medicare Other

## 2021-07-13 DIAGNOSIS — K862 Cyst of pancreas: Secondary | ICD-10-CM

## 2021-07-13 LAB — BASIC METABOLIC PANEL
BUN: 16 mg/dL (ref 6–23)
CO2: 29 mEq/L (ref 19–32)
Calcium: 10.4 mg/dL (ref 8.4–10.5)
Chloride: 102 mEq/L (ref 96–112)
Creatinine, Ser: 1.03 mg/dL (ref 0.40–1.20)
GFR: 51.92 mL/min — ABNORMAL LOW (ref >60.00)
Glucose, Bld: 80 mg/dL (ref 70–99)
Potassium: 4 mEq/L (ref 3.5–5.1)
Sodium: 139 mEq/L (ref 135–145)

## 2021-07-19 ENCOUNTER — Other Ambulatory Visit: Payer: Medicare Other

## 2021-07-20 DIAGNOSIS — R410 Disorientation, unspecified: Secondary | ICD-10-CM | POA: Diagnosis not present

## 2021-07-22 ENCOUNTER — Telehealth: Payer: Self-pay | Admitting: Neurology

## 2021-07-22 NOTE — Telephone Encounter (Signed)
We received a referral on patient for an episode of confusion and there's concern for a TIA. She was a patient of Dr. Jannifer Franklin' and his last note mentions her being followed by Dr. Brett Fairy, but based on the reviews she's read, she'd like see you if at all possible Dr. Jaynee Eagles. Would this be ok or would you recommend another provider?  Thank you!

## 2021-07-31 ENCOUNTER — Ambulatory Visit
Admission: RE | Admit: 2021-07-31 | Discharge: 2021-07-31 | Disposition: A | Discharge status: Home / Self Care | Payer: Medicare Other | Source: Ambulatory Visit | Attending: Nurse Practitioner | Admitting: Nurse Practitioner

## 2021-07-31 DIAGNOSIS — K862 Cyst of pancreas: Secondary | ICD-10-CM | POA: Diagnosis not present

## 2021-07-31 MED ORDER — GADOBENATE DIMEGLUMINE 529 MG/ML IV SOLN
12.0000 mL | Freq: Once | INTRAVENOUS | Status: AC | PRN
  Administered 2021-07-31: 12 mL via INTRAVENOUS

## 2021-08-06 ENCOUNTER — Telehealth: Payer: Self-pay | Admitting: Nurse Practitioner

## 2021-08-06 NOTE — Telephone Encounter (Signed)
Noted  

## 2021-08-09 NOTE — Telephone Encounter (Signed)
Pt called requesting results of MRCP on 07/31/21. Let pt know that [facility] had reached out to Dr. Lavon Paganini for recommendations and once we get the recommendations we will give her a call back. Pt verbalized understanding and had no other concerns at end of call.

## 2021-08-24 ENCOUNTER — Emergency Department (HOSPITAL_COMMUNITY)
Admission: EM | Admit: 2021-08-24 | Discharge: 2021-08-24 | Disposition: A | Discharge status: Home / Self Care | Payer: Medicare Other | Attending: Student | Admitting: Student

## 2021-08-24 ENCOUNTER — Encounter (HOSPITAL_COMMUNITY): Payer: Self-pay | Admitting: Emergency Medicine

## 2021-08-24 ENCOUNTER — Emergency Department (HOSPITAL_COMMUNITY): Payer: Medicare Other

## 2021-08-24 ENCOUNTER — Other Ambulatory Visit: Payer: Self-pay

## 2021-08-24 DIAGNOSIS — R001 Bradycardia, unspecified: Secondary | ICD-10-CM | POA: Diagnosis not present

## 2021-08-24 DIAGNOSIS — Y92093 Driveway of other non-institutional residence as the place of occurrence of the external cause: Secondary | ICD-10-CM | POA: Insufficient documentation

## 2021-08-24 DIAGNOSIS — S6992XA Unspecified injury of left wrist, hand and finger(s), initial encounter: Secondary | ICD-10-CM | POA: Diagnosis present

## 2021-08-24 DIAGNOSIS — S8992XA Unspecified injury of left lower leg, initial encounter: Secondary | ICD-10-CM | POA: Diagnosis not present

## 2021-08-24 DIAGNOSIS — T1490XA Injury, unspecified, initial encounter: Secondary | ICD-10-CM | POA: Diagnosis not present

## 2021-08-24 DIAGNOSIS — M25532 Pain in left wrist: Secondary | ICD-10-CM | POA: Diagnosis not present

## 2021-08-24 DIAGNOSIS — Z91040 Latex allergy status: Secondary | ICD-10-CM | POA: Diagnosis not present

## 2021-08-24 DIAGNOSIS — M7989 Other specified soft tissue disorders: Secondary | ICD-10-CM | POA: Insufficient documentation

## 2021-08-24 DIAGNOSIS — S80212A Abrasion, left knee, initial encounter: Secondary | ICD-10-CM | POA: Diagnosis not present

## 2021-08-24 DIAGNOSIS — S52502A Unspecified fracture of the lower end of left radius, initial encounter for closed fracture: Secondary | ICD-10-CM | POA: Diagnosis not present

## 2021-08-24 DIAGNOSIS — G319 Degenerative disease of nervous system, unspecified: Secondary | ICD-10-CM | POA: Diagnosis not present

## 2021-08-24 DIAGNOSIS — Z7982 Long term (current) use of aspirin: Secondary | ICD-10-CM | POA: Insufficient documentation

## 2021-08-24 DIAGNOSIS — S40212A Abrasion of left shoulder, initial encounter: Secondary | ICD-10-CM | POA: Insufficient documentation

## 2021-08-24 DIAGNOSIS — W01198A Fall on same level from slipping, tripping and stumbling with subsequent striking against other object, initial encounter: Secondary | ICD-10-CM | POA: Diagnosis not present

## 2021-08-24 DIAGNOSIS — S52592A Other fractures of lower end of left radius, initial encounter for closed fracture: Secondary | ICD-10-CM | POA: Diagnosis not present

## 2021-08-24 DIAGNOSIS — S022XXA Fracture of nasal bones, initial encounter for closed fracture: Secondary | ICD-10-CM | POA: Diagnosis not present

## 2021-08-24 DIAGNOSIS — Z043 Encounter for examination and observation following other accident: Secondary | ICD-10-CM | POA: Diagnosis not present

## 2021-08-24 DIAGNOSIS — W19XXXA Unspecified fall, initial encounter: Secondary | ICD-10-CM | POA: Diagnosis not present

## 2021-08-24 DIAGNOSIS — S0083XA Contusion of other part of head, initial encounter: Secondary | ICD-10-CM | POA: Diagnosis not present

## 2021-08-24 DIAGNOSIS — R52 Pain, unspecified: Secondary | ICD-10-CM | POA: Diagnosis not present

## 2021-08-24 LAB — CBC WITH DIFFERENTIAL/PLATELET
Abs Immature Granulocytes: 0.01 10*3/uL (ref 0.00–0.07)
Basophils Absolute: 0 10*3/uL (ref 0.0–0.1)
Basophils Relative: 1 %
Eosinophils Absolute: 0.2 10*3/uL (ref 0.0–0.5)
Eosinophils Relative: 5 %
HCT: 37.6 % (ref 36.0–46.0)
Hemoglobin: 12.7 g/dL (ref 12.0–15.0)
Immature Granulocytes: 0 %
Lymphocytes Relative: 14 %
Lymphs Abs: 0.7 10*3/uL (ref 0.7–4.0)
MCH: 32.4 pg (ref 26.0–34.0)
MCHC: 33.8 g/dL (ref 30.0–36.0)
MCV: 95.9 fL (ref 80.0–100.0)
Monocytes Absolute: 0.3 10*3/uL (ref 0.1–1.0)
Monocytes Relative: 6 %
Neutro Abs: 3.6 10*3/uL (ref 1.7–7.7)
Neutrophils Relative %: 74 %
Platelets: 190 10*3/uL (ref 150–400)
RBC: 3.92 MIL/uL (ref 3.87–5.11)
RDW: 12.2 % (ref 11.5–15.5)
WBC: 4.9 10*3/uL (ref 4.0–10.5)
nRBC: 0 % (ref 0.0–0.2)

## 2021-08-24 LAB — BASIC METABOLIC PANEL
Anion gap: 5 (ref 5–15)
BUN: 11 mg/dL (ref 8–23)
CO2: 27 mmol/L (ref 22–32)
Calcium: 9.6 mg/dL (ref 8.9–10.3)
Chloride: 105 mmol/L (ref 98–111)
Creatinine, Ser: 0.88 mg/dL (ref 0.44–1.00)
GFR, Estimated: >60 mL/min (ref >60)
Glucose, Bld: 103 mg/dL — ABNORMAL HIGH (ref 70–99)
Potassium: 4.1 mmol/L (ref 3.5–5.1)
Sodium: 137 mmol/L (ref 135–145)

## 2021-08-24 LAB — URINALYSIS, ROUTINE W REFLEX MICROSCOPIC
Bilirubin Urine: NEGATIVE
Glucose, UA: NEGATIVE mg/dL
Hgb urine dipstick: NEGATIVE
Ketones, ur: NEGATIVE mg/dL
Leukocytes,Ua: NEGATIVE
Nitrite: NEGATIVE
Protein, ur: NEGATIVE mg/dL
Specific Gravity, Urine: 1.006 (ref 1.005–1.030)
pH: 8 (ref 5.0–8.0)

## 2021-08-24 MED ORDER — CEFADROXIL 500 MG PO CAPS: 500.0000 mg | ORAL_CAPSULE | Freq: Two times a day (BID) | ORAL | 0 refills | Status: DC

## 2021-08-24 MED ORDER — ONDANSETRON HCL 4 MG/2ML IJ SOLN
4.0000 mg | Freq: Once | INTRAMUSCULAR | Status: AC
Start: 2021-08-24 — End: 2021-08-24
  Administered 2021-08-24: 4 mg via INTRAVENOUS
  Filled 2021-08-24: qty 2

## 2021-08-24 MED ORDER — DULOXETINE HCL 30 MG PO CPEP
30.0000 mg | ORAL_CAPSULE | Freq: Once | ORAL | Status: AC
  Administered 2021-08-24: 30 mg via ORAL
  Filled 2021-08-24: qty 1

## 2021-08-24 MED ORDER — LOSARTAN POTASSIUM 25 MG PO TABS
100.0000 mg | ORAL_TABLET | Freq: Once | ORAL | Status: AC
  Administered 2021-08-24: 100 mg via ORAL
  Filled 2021-08-24: qty 4

## 2021-08-24 MED ORDER — FENTANYL CITRATE PF 50 MCG/ML IJ SOSY
25.0000 ug | PREFILLED_SYRINGE | Freq: Once | INTRAMUSCULAR | Status: AC
  Administered 2021-08-24: 25 ug via INTRAVENOUS
  Filled 2021-08-24: qty 1

## 2021-08-24 MED ORDER — FAMOTIDINE 20 MG PO TABS
20.0000 mg | ORAL_TABLET | Freq: Once | ORAL | Status: AC
Start: 2021-08-24 — End: 2021-08-24
  Administered 2021-08-24: 20 mg via ORAL
  Filled 2021-08-24: qty 1

## 2021-08-24 MED ORDER — MORPHINE SULFATE (PF) 4 MG/ML IV SOLN
4.0000 mg | Freq: Once | INTRAVENOUS | Status: AC
  Administered 2021-08-24: 4 mg via INTRAVENOUS
  Filled 2021-08-24: qty 1

## 2021-08-24 NOTE — Discharge Instructions (Addendum)
Please take your hydrocodone at home.  I would like for you to also take 600 mg ibuprofen every 6 hours as needed for pain.  Please follow-up with orthopedics and ENT for further evaluation.  I also written you prescription for antibiotics for your nasal fracture.  Please take as prescribed.  Return to the [facility] apartment for any worsening symptoms you might have.

## 2021-08-24 NOTE — ED Provider Notes (Signed)
[facility] Provider Note   CSN: [id number] Arrival date & time: 08/24/21  1559     History Chief Complaint  Patient presents with   [name] is a 81 y.o. female who presents to the emergency department after mechanical trip and fall that occurred just prior to arrival.  Patient was trying to tell her husband something on the driveway when she tripped over one of the cement pavers and fell forwards striking her face against the pavement.  She did not lose consciousness.  Since the fall she complains of pain to the face and nose where she sustained abrasions, left wrist where she tried to stop her self, and left knee where she fell.  She denies any chest pain, shortness of breath, abdominal pain, nausea, vomiting, diarrhea.  Patient is not anticoagulated apart from aspirin.   Fall       Home Medications Prior to Admission medications   Medication Sig Start Date End Date Taking? Authorizing Provider  albuterol (VENTOLIN HFA) 108 (90 Base) MCG/ACT inhaler Inhale 1 puff into the lungs as needed. 02/22/21  Yes [provider]  aspirin EC 81 MG tablet Take 81 mg by mouth daily.   Yes [provider]  benzonatate (TESSALON) 200 MG capsule Take 200 mg by mouth 3 (three) times daily as needed. 04/27/21  Yes [provider]  bismuth subsalicylate (PEPTO BISMOL) 262 MG/15ML suspension Take 30 mLs by mouth every 6 (six) hours as needed.   Yes [provider]  busPIRone (BUSPAR) 5 MG tablet Take 5 mg by mouth 2 (two) times daily. 06/16/21  Yes [provider]  cefadroxil (DURICEF) 500 MG capsule Take 1 capsule (500 mg total) by mouth 2 (two) times daily. 08/24/21  Yes Raul Del, Girlie Veltri M, PA-C  Coenzyme Q10 (CO Q-10) 100 MG CAPS Take 100 mg by mouth daily.   Yes [provider]  DULoxetine (CYMBALTA) 30 MG capsule Take 30 mg by mouth daily. 02/25/21  Yes [provider]  EPINEPHrine 0.3 mg/0.3 mL IJ SOAJ  injection Inject 0.3 mg into the muscle as needed. 05/15/20  Yes [provider]  ergocalciferol (VITAMIN D2) 50000 UNITS capsule Take 50,000 Units by mouth once a week.   Yes [provider]  famotidine (PEPCID) 20 MG tablet Take 1 tablet (20 mg total) by mouth 2 (two) times daily. 04/22/21  Yes Nandigam, Venia Minks, MD  HYDROcodone-acetaminophen (NORCO) 10-325 MG tablet Take 1 tablet by mouth 2 (two) times daily. 08/11/21  Yes [provider]  hyoscyamine (LEVSIN SL) 0.125 MG SL tablet DISSOLVE 1 TABLET UNDER THE TONGUE EVERY 6 HOURS AS NEEDED Patient taking differently: Take 0.125 mg by mouth every 6 (six) hours as needed for cramping. 02/09/21  Yes Nandigam, Venia Minks, MD  ipratropium (ATROVENT) 0.06 % nasal spray Place into both nostrils as needed. 01/18/18  Yes [provider]  levothyroxine (SYNTHROID, LEVOTHROID) 50 MCG tablet Take 50 mcg by mouth daily before breakfast. Once daily 08/07/13  Yes [provider]  LORazepam (ATIVAN) 1 MG tablet Take 1 tablet by mouth at bedtime. 05/17/14  Yes [provider]  losartan (COZAAR) 100 MG tablet Take 100 mg by mouth daily.   Yes [provider]  Multiple Vitamins-Minerals (PRESERVISION AREDS) TABS Take 1 tablet by mouth daily.   Yes [provider]  ondansetron (ZOFRAN) 4 MG tablet Take 4 mg by mouth every 8 (eight) hours as needed for nausea or vomiting.  Yes [provider]  SUMAtriptan (IMITREX) 50 MG tablet Take 50 mg by mouth every 2 (two) hours as needed. For migraine   Yes [provider]  triamcinolone (KENALOG) 0.025 % ointment Apply 1 application topically 2 (two) times daily. 06/12/20  Yes Valentina Shaggy, MD      Allergies    Crestor [rosuvastatin calcium], Zocor [simvastatin], Enalapril, Lamictal [lamotrigine], Nitrofuran derivatives, Erythromycin, Methylprednisolone, Nystatin, Vivelle [estradiol], Zetia [ezetimibe], Ciprofloxacin, Ciprofloxacin hcl,  Latex, Shellfish allergy, Sulfa antibiotics, and Sulfasalazine    Review of Systems   Review of Systems  All other systems reviewed and are negative.   Physical Exam Updated Vital Signs BP (!) 159/95   Pulse 74   Temp (!) 97.4 F (36.3 C) (Oral)   Resp 12   Ht '5\' 4"'$  (1.626 m)   Wt 62.6 kg   SpO2 100%   BMI 23.69 kg/m  Physical Exam Vitals and nursing note reviewed.  Constitutional:      General: She is not in acute distress.    Appearance: Normal appearance.  HENT:     Head: Normocephalic.   Eyes:     General:        Right eye: No discharge.        Left eye: No discharge.  Cardiovascular:     Comments: Regular rate and rhythm.  S1/S2 are distinct without any evidence of murmur, rubs, or gallops.  Radial pulses are 2+ bilaterally.  Dorsalis pedis pulses are 2+ bilaterally.  No evidence of pedal edema. Pulmonary:     Comments: Clear to auscultation bilaterally.  Normal effort.  No respiratory distress.  No evidence of wheezes, rales, or rhonchi heard throughout. Abdominal:     General: Abdomen is flat. Bowel sounds are normal. There is no distension.     Tenderness: There is no abdominal tenderness. There is no guarding or rebound.  Musculoskeletal:        General: Normal range of motion.     Cervical back: Neck supple.     Comments: Left wrist is swollen and tender to palpation.  There is questionable deformity.  Skin:    General: Skin is warm and dry.     Findings: No rash.     Comments: Superficial abrasion to the left shoulder and left knee.  Neurological:     General: No focal deficit present.     Mental Status: She is alert.     GCS: GCS eye subscore is 4. GCS verbal subscore is 5. GCS motor subscore is 6.  Psychiatric:        Mood and Affect: Mood normal.        Behavior: Behavior normal.     ED Results / Procedures / Treatments   Labs (all labs ordered are listed, but only abnormal results are displayed) Labs Reviewed  BASIC METABOLIC PANEL -  Abnormal; Notable for the following components:      Result Value   Glucose, Bld 103 (*)    All other components within normal limits  CBC WITH DIFFERENTIAL/PLATELET  URINALYSIS, ROUTINE W REFLEX MICROSCOPIC    EKG None  Radiology CT Head Wo Contrast  Result Date: 08/24/2021 CLINICAL DATA:  Trauma.  Fall. EXAM: CT HEAD WITHOUT CONTRAST CT MAXILLOFACIAL WITHOUT CONTRAST CT CERVICAL SPINE WITHOUT CONTRAST TECHNIQUE: Multidetector CT imaging of the head, cervical spine, and maxillofacial structures were performed using the standard protocol without intravenous contrast. Multiplanar CT image reconstructions of the cervical spine and maxillofacial structures were also generated. RADIATION DOSE  REDUCTION: This exam was performed according to the departmental dose-optimization program which includes automated exposure control, adjustment of the mA and/or kV according to patient size and/or use of iterative reconstruction technique. COMPARISON:  CT head 10/24/2017 FINDINGS: CT HEAD FINDINGS Brain: No evidence of acute infarction, hemorrhage, hydrocephalus, extra-axial collection or mass lesion/mass effect. There is stable mild diffuse atrophy. There is stable mild periventricular white matter hypodensity, likely chronic small vessel ischemic change. There is an old lacunar infarct in the left basal ganglia, unchanged. Vascular: No hyperdense vessel or unexpected calcification. Skull: Normal. Negative for fracture or focal lesion. Other: Left frontal scalp soft tissue swelling. CT MAXILLOFACIAL FINDINGS Osseous: There are minimally depressed bilateral nasal bone fractures. No other fracture or dislocation identified. Orbits: Negative. No traumatic or inflammatory finding. Sinuses: Clear. Soft tissues: Soft tissue swelling overlying the nose with small laceration. No foreign body. CT CERVICAL SPINE FINDINGS Alignment: There is 2 mm of anterolisthesis at C4-C5 and C6-C7 which is favored is degenerative. Alignment  is otherwise anatomic. Skull base and vertebrae: No evidence for acute fracture. No focal osseous lesion. Soft tissues and spinal canal: No prevertebral fluid or swelling. No visible canal hematoma. Disc levels: There is disc space narrowing and endplate osteophyte formation at C5-C6 compatible with degenerative change. There are degenerative changes of facet joints bilaterally, left greater than right. There is severe left neural foraminal stenosis at C3-C4 secondary to facet arthropathy. No significant central canal stenosis at any level. Upper chest: Scarring in the lung apices. Other: None. IMPRESSION: 1. No acute intracranial process. 2. Bilateral nasal bone fractures with overlying soft tissue swelling and laceration. 3. No acute fracture or traumatic subluxation of the cervical spine. 4. Mild diffuse brain atrophy and mild chronic small vessel ischemic change. 5. Multilevel degenerative changes of the cervical spine. Electronically Signed   By: Ronney Asters M.D.   On: 08/24/2021 18:19   CT Cervical Spine Wo Contrast  Result Date: 08/24/2021 CLINICAL DATA:  Trauma.  Fall. EXAM: CT HEAD WITHOUT CONTRAST CT MAXILLOFACIAL WITHOUT CONTRAST CT CERVICAL SPINE WITHOUT CONTRAST TECHNIQUE: Multidetector CT imaging of the head, cervical spine, and maxillofacial structures were performed using the standard protocol without intravenous contrast. Multiplanar CT image reconstructions of the cervical spine and maxillofacial structures were also generated. RADIATION DOSE REDUCTION: This exam was performed according to the departmental dose-optimization program which includes automated exposure control, adjustment of the mA and/or kV according to patient size and/or use of iterative reconstruction technique. COMPARISON:  CT head 10/24/2017 FINDINGS: CT HEAD FINDINGS Brain: No evidence of acute infarction, hemorrhage, hydrocephalus, extra-axial collection or mass lesion/mass effect. There is stable mild diffuse atrophy.  There is stable mild periventricular white matter hypodensity, likely chronic small vessel ischemic change. There is an old lacunar infarct in the left basal ganglia, unchanged. Vascular: No hyperdense vessel or unexpected calcification. Skull: Normal. Negative for fracture or focal lesion. Other: Left frontal scalp soft tissue swelling. CT MAXILLOFACIAL FINDINGS Osseous: There are minimally depressed bilateral nasal bone fractures. No other fracture or dislocation identified. Orbits: Negative. No traumatic or inflammatory finding. Sinuses: Clear. Soft tissues: Soft tissue swelling overlying the nose with small laceration. No foreign body. CT CERVICAL SPINE FINDINGS Alignment: There is 2 mm of anterolisthesis at C4-C5 and C6-C7 which is favored is degenerative. Alignment is otherwise anatomic. Skull base and vertebrae: No evidence for acute fracture. No focal osseous lesion. Soft tissues and spinal canal: No prevertebral fluid or swelling. No visible canal hematoma. Disc levels: There is disc space  narrowing and endplate osteophyte formation at C5-C6 compatible with degenerative change. There are degenerative changes of facet joints bilaterally, left greater than right. There is severe left neural foraminal stenosis at C3-C4 secondary to facet arthropathy. No significant central canal stenosis at any level. Upper chest: Scarring in the lung apices. Other: None. IMPRESSION: 1. No acute intracranial process. 2. Bilateral nasal bone fractures with overlying soft tissue swelling and laceration. 3. No acute fracture or traumatic subluxation of the cervical spine. 4. Mild diffuse brain atrophy and mild chronic small vessel ischemic change. 5. Multilevel degenerative changes of the cervical spine. Electronically Signed   By: Ronney Asters M.D.   On: 08/24/2021 18:19   CT Maxillofacial Wo Contrast  Result Date: 08/24/2021 CLINICAL DATA:  Trauma.  Fall. EXAM: CT HEAD WITHOUT CONTRAST CT MAXILLOFACIAL WITHOUT CONTRAST CT  CERVICAL SPINE WITHOUT CONTRAST TECHNIQUE: Multidetector CT imaging of the head, cervical spine, and maxillofacial structures were performed using the standard protocol without intravenous contrast. Multiplanar CT image reconstructions of the cervical spine and maxillofacial structures were also generated. RADIATION DOSE REDUCTION: This exam was performed according to the departmental dose-optimization program which includes automated exposure control, adjustment of the mA and/or kV according to patient size and/or use of iterative reconstruction technique. COMPARISON:  CT head 10/24/2017 FINDINGS: CT HEAD FINDINGS Brain: No evidence of acute infarction, hemorrhage, hydrocephalus, extra-axial collection or mass lesion/mass effect. There is stable mild diffuse atrophy. There is stable mild periventricular white matter hypodensity, likely chronic small vessel ischemic change. There is an old lacunar infarct in the left basal ganglia, unchanged. Vascular: No hyperdense vessel or unexpected calcification. Skull: Normal. Negative for fracture or focal lesion. Other: Left frontal scalp soft tissue swelling. CT MAXILLOFACIAL FINDINGS Osseous: There are minimally depressed bilateral nasal bone fractures. No other fracture or dislocation identified. Orbits: Negative. No traumatic or inflammatory finding. Sinuses: Clear. Soft tissues: Soft tissue swelling overlying the nose with small laceration. No foreign body. CT CERVICAL SPINE FINDINGS Alignment: There is 2 mm of anterolisthesis at C4-C5 and C6-C7 which is favored is degenerative. Alignment is otherwise anatomic. Skull base and vertebrae: No evidence for acute fracture. No focal osseous lesion. Soft tissues and spinal canal: No prevertebral fluid or swelling. No visible canal hematoma. Disc levels: There is disc space narrowing and endplate osteophyte formation at C5-C6 compatible with degenerative change. There are degenerative changes of facet joints bilaterally, left  greater than right. There is severe left neural foraminal stenosis at C3-C4 secondary to facet arthropathy. No significant central canal stenosis at any level. Upper chest: Scarring in the lung apices. Other: None. IMPRESSION: 1. No acute intracranial process. 2. Bilateral nasal bone fractures with overlying soft tissue swelling and laceration. 3. No acute fracture or traumatic subluxation of the cervical spine. 4. Mild diffuse brain atrophy and mild chronic small vessel ischemic change. 5. Multilevel degenerative changes of the cervical spine. Electronically Signed   By: Ronney Asters M.D.   On: 08/24/2021 18:19   DG Knee Complete 4 Views Left  Result Date: 08/24/2021 CLINICAL DATA:  Trauma, pain EXAM: LEFT KNEE - COMPLETE 4+ VIEW COMPARISON:  None Available. FINDINGS: No fracture or dislocation is seen. There is no significant effusion. Minimal bony spurs are seen in the patella. IMPRESSION: No recent fracture is seen in left knee. Electronically Signed   By: Elmer Picker M.D.   On: 08/24/2021 17:20   DG Wrist Complete Left  Result Date: 08/24/2021 CLINICAL DATA:  Pain EXAM: LEFT WRIST - COMPLETE 3+ VIEW  COMPARISON:  None Available. FINDINGS: There is an acute impacted transverse distal radius fracture. There is mild apex anterior angulation. There is no dislocation. There is soft tissue swelling surrounding the wrist. There are moderate degenerative changes at the first carpometacarpal joint. IMPRESSION: 1. Impacted distal radius fracture pain. Electronically Signed   By: Ronney Asters M.D.   On: 08/24/2021 17:20    Procedures Procedures    Medications Ordered in ED Medications  fentaNYL (SUBLIMAZE) injection 25 mcg (25 mcg Intravenous Given 08/24/21 1642)  morphine (PF) 4 MG/ML injection 4 mg (4 mg Intravenous Given 08/24/21 1835)  ondansetron (ZOFRAN) injection 4 mg (4 mg Intravenous Given 08/24/21 1835)  losartan (COZAAR) tablet 100 mg (100 mg Oral Given 08/24/21 1920)  DULoxetine  (CYMBALTA) DR capsule 30 mg (30 mg Oral Given 08/24/21 1919)  famotidine (PEPCID) tablet 20 mg (20 mg Oral Given 08/24/21 1920)    ED Course/ Medical Decision Making/ A&P Clinical Course as of 08/24/21 2009  Tue Aug 24, 2021  1846 On reevaluation, patient's pain is improved.  I discussed the findings of the images with her at the bedside.  Patient does not have any ENT but does an orthopedist. [CF]  1849 CBC with Differential Normal. [CF]  0867 Basic metabolic panel(!) Normal. [CF]  1849 I reviewed the x-rays of the left knee and left wrist.  There is evidence of a fracture over the left distal radius.  I do agree with the radiologist interpretation. [CF]    Clinical Course User Index [CF] Hendricks Limes, PA-C                           Medical Decision Making MARKIESHA DELIA is a 81 y.o. female patient presents to the emergency department after mechanical trip and fall.  Patient has some extensive abrasions and ecchymosis to the bridge of the nose and forehead.  Given the patient's age and mechanism I will evaluate with CT head, CT maxillofacial, and CT cervical spine.  She is placed in a c-collar for now.  Left wrist does appear questionably fractured.  I will get imaging of this as well in addition to imaging of the left knee.  I will give her some pain medication and plan to reassess.  She is in no acute distress at this time answer my questions appropriately.  She is GCS 15.  Patient's left wrist was splinted.  She had good cap refill pre and post procedure.  I have given her Duricef to cover for her nasal fractures that she does have a deep abrasion over the nose.  I am going to have her follow-up with ENT and orthopedics for further evaluation.  She was encouraged to return to the [facility] for any worsening symptoms.  She is safe for discharge at this time.  I cannot write her for narcotic pain medication as she is under pain contract.  Amount and/or Complexity of Data  Reviewed Labs: ordered. Decision-making details documented in ED Course. Radiology: ordered.  Risk Prescription drug management.   Final Clinical Impression(s) / ED Diagnoses Final diagnoses:  Closed fracture of distal end of left radius, unspecified fracture morphology, initial encounter  Closed fracture of nasal bone, initial encounter    Rx / DC Orders ED Discharge Orders          Ordered    cefadroxil (DURICEF) 500 MG capsule  2 times daily        08/24/21 2005  Hendricks Limes, PA-C 08/24/21 2010    Teressa Lower, MD 08/27/21 [phone]

## 2021-08-24 NOTE — ED Triage Notes (Signed)
Pt was walking outside and tripped on stepping stones, landing on left wrist and face, bruises noted to face, nose, knee, left shoulder, head.

## 2021-08-26 ENCOUNTER — Ambulatory Visit: Payer: Medicare Other | Admitting: Neurology

## 2021-08-26 DIAGNOSIS — S41112D Laceration without foreign body of left upper arm, subsequent encounter: Secondary | ICD-10-CM | POA: Diagnosis not present

## 2021-08-26 DIAGNOSIS — Z23 Encounter for immunization: Secondary | ICD-10-CM | POA: Diagnosis not present

## 2021-08-27 DIAGNOSIS — S52522A Torus fracture of lower end of left radius, initial encounter for closed fracture: Secondary | ICD-10-CM | POA: Diagnosis not present

## 2021-09-02 DIAGNOSIS — M25532 Pain in left wrist: Secondary | ICD-10-CM | POA: Diagnosis not present

## 2021-09-02 DIAGNOSIS — S52522A Torus fracture of lower end of left radius, initial encounter for closed fracture: Secondary | ICD-10-CM | POA: Diagnosis not present

## 2021-09-02 DIAGNOSIS — M25531 Pain in right wrist: Secondary | ICD-10-CM | POA: Diagnosis not present

## 2021-09-02 DIAGNOSIS — S52501A Unspecified fracture of the lower end of right radius, initial encounter for closed fracture: Secondary | ICD-10-CM | POA: Diagnosis not present

## 2021-09-08 ENCOUNTER — Other Ambulatory Visit: Payer: Self-pay | Admitting: Gastroenterology

## 2021-09-08 DIAGNOSIS — K219 Gastro-esophageal reflux disease without esophagitis: Secondary | ICD-10-CM

## 2021-09-15 DIAGNOSIS — K862 Cyst of pancreas: Secondary | ICD-10-CM | POA: Diagnosis not present

## 2021-09-15 DIAGNOSIS — S62109A Fracture of unspecified carpal bone, unspecified wrist, initial encounter for closed fracture: Secondary | ICD-10-CM | POA: Diagnosis not present

## 2021-09-15 DIAGNOSIS — S022XXA Fracture of nasal bones, initial encounter for closed fracture: Secondary | ICD-10-CM | POA: Diagnosis not present

## 2021-09-15 DIAGNOSIS — M797 Fibromyalgia: Secondary | ICD-10-CM | POA: Diagnosis not present

## 2021-09-27 DIAGNOSIS — M13831 Other specified arthritis, right wrist: Secondary | ICD-10-CM | POA: Diagnosis not present

## 2021-09-27 DIAGNOSIS — M13832 Other specified arthritis, left wrist: Secondary | ICD-10-CM | POA: Diagnosis not present

## 2021-09-27 DIAGNOSIS — S52522A Torus fracture of lower end of left radius, initial encounter for closed fracture: Secondary | ICD-10-CM | POA: Diagnosis not present

## 2021-10-11 DIAGNOSIS — S52522A Torus fracture of lower end of left radius, initial encounter for closed fracture: Secondary | ICD-10-CM | POA: Diagnosis not present

## 2021-10-11 DIAGNOSIS — M13831 Other specified arthritis, right wrist: Secondary | ICD-10-CM | POA: Diagnosis not present

## 2021-10-11 DIAGNOSIS — M25532 Pain in left wrist: Secondary | ICD-10-CM | POA: Diagnosis not present

## 2021-10-12 DIAGNOSIS — N3001 Acute cystitis with hematuria: Secondary | ICD-10-CM | POA: Diagnosis not present

## 2021-10-12 DIAGNOSIS — R3589 Other polyuria: Secondary | ICD-10-CM | POA: Diagnosis not present

## 2021-10-19 DIAGNOSIS — F331 Major depressive disorder, recurrent, moderate: Secondary | ICD-10-CM | POA: Diagnosis not present

## 2021-10-19 DIAGNOSIS — N1831 Chronic kidney disease, stage 3a: Secondary | ICD-10-CM | POA: Diagnosis not present

## 2021-10-19 DIAGNOSIS — I129 Hypertensive chronic kidney disease with stage 1 through stage 4 chronic kidney disease, or unspecified chronic kidney disease: Secondary | ICD-10-CM | POA: Diagnosis not present

## 2021-10-27 ENCOUNTER — Ambulatory Visit: Payer: Medicare Other | Admitting: Neurology

## 2021-11-01 DIAGNOSIS — S63502A Unspecified sprain of left wrist, initial encounter: Secondary | ICD-10-CM | POA: Diagnosis not present

## 2021-11-01 DIAGNOSIS — M25532 Pain in left wrist: Secondary | ICD-10-CM | POA: Diagnosis not present

## 2021-11-14 ENCOUNTER — Other Ambulatory Visit: Payer: Self-pay | Admitting: Gastroenterology

## 2021-11-14 DIAGNOSIS — K219 Gastro-esophageal reflux disease without esophagitis: Secondary | ICD-10-CM

## 2021-11-15 ENCOUNTER — Ambulatory Visit (INDEPENDENT_AMBULATORY_CARE_PROVIDER_SITE_OTHER): Payer: Medicare Other | Admitting: Neurology

## 2021-11-15 ENCOUNTER — Encounter: Payer: Self-pay | Admitting: Neurology

## 2021-11-15 VITALS — BP 140/83 | HR 72 | Ht 60.0 in | Wt 134.6 lb

## 2021-11-15 DIAGNOSIS — R4189 Other symptoms and signs involving cognitive functions and awareness: Secondary | ICD-10-CM | POA: Diagnosis not present

## 2021-11-15 DIAGNOSIS — R4701 Aphasia: Secondary | ICD-10-CM

## 2021-11-15 DIAGNOSIS — R410 Disorientation, unspecified: Secondary | ICD-10-CM | POA: Diagnosis not present

## 2021-11-15 NOTE — Progress Notes (Signed)
Reason for visit: confusion episode with word difficulty and fogginess, unclear etiology, evaluate for TIA  [name] is an 81 y.o. female here for a new issue possible TIA. She has already been seen in our practice in the past by Dr. Jannifer Choi who retired. She has a PMHx of anxiety, asthma, CKD(BUN and creat was recentky normal), urticaria(on zolair in the past) depression,HLD, HTN, memory loss, tremor, migraines, stress, prior TIA's per patient. She is on ASA '81mg'$ , MMSE 30/30 12/2020 per [facility], I reviewed notes per [facility] team, she had a confusion episode with word difficulty and fogginess, unclear etiology, differential was TIA, and continued on aspirin.  Patient reported she had a memory lapse, felt confusion and difficulty with word finding after breakfast, possibly was after taking pain meds, confusion lasted for a few hours, patient had a CT of the head in 2019 that was unremarkable, 2016 MRI showed chronic small vessel microvascular disease, the episode of confusion and some difficulty with word finding lasted for about a few hours, she had no trouble swallowing, no weakness in the upper extremities or lower extremities, no severe headaches, she has a history of migraines but this was different, she has had UTIs in the past, urinalysis did show trace leukoesterase but no infection.Patient had  a mechanical fall 08/24/2021, reviewed ED notes, CT Head was unremarkable. MRi in 2016 showed normal for age microvascular white matter changes but no old ischemic events.  Patient does not remember having an episode of confusion. She states she never smoked and never drank. She reports she had a mechanical fall and she says her memory has been going downhill. She has been complaining of memory problems for several years, she states since she hit her head in July she saus her memory is worse. She states she saw Dr. Valentina Choi 13 years ago and had a normal neurocognitive examination. She states there is a  lot of stress at home, her husband is 45 years older and almost threw his walker at her, husand has chronic back pain and see Dr. Arnoldo Choi, her husband also had a stroke she thinks and when she got his plate she called the EMTs and took him to the hospital and he had a CVA. So there is a lot of stress. If husband falls, his grandson lives in patient's mother's house and he is an addict and has been to prison. She has a son [name] that is within walking distance to her and he worked at [employer] and he is recovered. The mysoline helped her but she felt like she can be fine without the medication. She had a bad fall, she was sent home with blood still on her face. She has very bad depression, she can't take medications but she takes roxicodone and had to take it last night for fibromyalgia.   08/2021: BUN 11 creatinine 0.88 normal  CT head 08/24/2021: CT HEAD FINDINGS   Brain: No evidence of acute infarction, hemorrhage, hydrocephalus, extra-axial collection or mass lesion/mass effect. There is stable mild diffuse atrophy. There is stable mild periventricular white matter hypodensity, likely chronic small vessel ischemic change. There is an old lacunar infarct in the left basal ganglia, unchanged.   Vascular: No hyperdense vessel or unexpected calcification.   Skull: Normal. Negative for fracture or focal lesion.   Other: Left frontal scalp soft tissue swelling.   MRI brain 01/16/2015: EXAM: MRI Brain with and without contrast   ORDERING CLINICIAN: Ward Givens NP CLINICAL HISTORY: 81 year old  woman with headache and facial numbness COMPARISON FILMS: MRI of the brain 11/17/2008   TECHNIQUE:MRI of the brain with and without contrast was obtained utilizing 5 mm axial slices with T1, T2, T2 flair, SWI and diffusion weighted views.  T1 sagittal, T2 coronal and postcontrast views in the axial and coronal plane were obtained. CONTRAST: 16 ml [employer] IMAGING SITE: [facility], [place].   FINDINGS: On sagittal images, the spinal cord is imaged caudally to C4 and is normal in caliber.   The contents of the posterior fossa are of normal size and position.   The pituitary gland and optic chiasm appear normal.    Mild cortical atrophy is noted..   The ventricles are normal in size for age and without distortion.  There are no abnormal extra-axial collections of fluid.     The cerebellum and brainstem appear normal.   There are many expanded Virchow-Robin spaces adjacent to the basal ganglia bilaterally. In the hemispheres, there are scattered T2/FLAIR hyperintense foci located in the subcortical and deep white matter.  None of these appears acute.  The orbits appear normal.   The VIIth/VIIIth nerve complex appears normal.  The mastoid air cells appear normal.  The paranasal sinuses appear normal.  Flow voids are identified within the major intracerebral arteries.      Diffusion weighted images are normal.  Susceptibility weighted images are normal.   After the infusion of contrast material, a normal enhancement pattern is noted.   The study was compared to the MRI of the brain dated 11/17/2008. In the interim, several more T2/FLAIR hyperintense foci in the upper cortical deep white matter have developed.     IMPRESSION:  This MRI of the brain with and without contrast shows the following: 1. Mild cortical atrophy that is probably within normal limits for age. 2.  Scattered T2/FLAIR hyperintense foci in the subcortical deep white matter of both hemispheres consistent with chronic microvascular ischemic changes. The extent is mildly more than expected for age. When compared to the MRI dated 11/17/2008, some of these have developed during the interim, though none appears to be acute on the current study. 3.  There are no acute findings.  Patient complains of symptoms per HPI as well as the following symptoms: depression . Pertinent negatives and positives per HPI. All others  negative    History of present illness:11/26/2020 Dr. Jannifer Choi' notes below  [name] is a 81 year old right-handed white female with a history of a tremor.  She has occasional headaches, she has diffuse neuromuscular discomfort and may have fibromyalgia.  She does report a mild memory problem.  She has significant issues with depression, she cares for her husband who is having a lot of medical issues.  Husband may be getting demented with confusion and urinary incontinence.  The patient came off of Diamox for her tremor due to some stomach upset.  The tremor seems to be a bit worse in the morning.  She has noted some changes in her handwriting but she is still able to generate handwriting fairly well.  There is no resting component to the tremor.  Past Medical History:  Diagnosis Date   ADD (attention deficit disorder)    Anxiety    Arthritis    Asthma    Chronic kidney disease    CKD Stage 3   Chronic renal insufficiency, stage III (moderate) ([place])    COVID-19    Depression    major depression   Dermatitis  Diverticulosis    Essential tremor 05/17/2012   Fibromyalgia    GERD (gastroesophageal reflux disease)    Hyperlipidemia    Hypertension    Memory loss 05/17/2012   Migraines    Occasional tremors    Pneumonia    TIA (transient ischemic attack)    History of 2 TIA's per pt   Tremor     Past Surgical History:  Procedure Laterality Date   ABDOMINAL HYSTERECTOMY     APPENDECTOMY     CATARACT EXTRACTION Bilateral    TONSILLECTOMY AND ADENOIDECTOMY     age 27   TOTAL HIP ARTHROPLASTY Bilateral 2009   Dr Adriana Mccallum    Family History  Problem Relation Age of Onset   Liver cancer Mother    Pulmonary embolism Mother    Prostate cancer Father    Depression Brother        suicide   Lung cancer Maternal Aunt    ALS Paternal Aunt    Brain cancer Paternal Aunt    Breast cancer Paternal Aunt    Diabetes Paternal Aunt        x 2   Prostate cancer Paternal Uncle         x 2   Stroke Maternal Grandmother    Heart failure Maternal Grandfather    Emphysema Maternal Grandfather    Congestive Heart Failure Maternal Grandfather    Coronary artery disease Paternal Grandmother    Stroke Paternal Grandmother    Coronary artery disease Paternal Grandfather    Breast cancer Cousin    Colon cancer Neg Hx    Esophageal cancer Neg Hx    Rectal cancer Neg Hx    Stomach cancer Neg Hx     Social history:  reports that she has never smoked. She has never used smokeless tobacco. She reports that she does not drink alcohol and does not use drugs.    Allergies  Allergen Reactions   Crestor [Rosuvastatin Calcium]     myalgia   Zocor [Simvastatin]     myalgia   Enalapril     swelling   Lamictal [Lamotrigine]     rash   Nitrofuran Derivatives Nausea Only   Erythromycin Other (See Comments)    Stomach burning   Methylprednisolone     Major depression, weakness, nausea   Nystatin Other (See Comments)    Blisters on her lips   Vivelle [Estradiol]     rash   Zetia [Ezetimibe]     Muscle weakness   Ciprofloxacin Nausea Only   Ciprofloxacin Hcl     rash   Latex Rash   Shellfish Allergy Rash   Sulfa Antibiotics Nausea Only   Sulfasalazine Nausea Only    Medications:  Prior to Admission medications   Medication Sig Start Date End Date Taking? Authorizing Provider  aspirin EC 81 MG tablet Take 81 mg by mouth daily.   Yes [provider]  bismuth subsalicylate (PEPTO BISMOL) 262 MG/15ML suspension Take 30 mLs by mouth every 6 (six) hours as needed.   Yes [provider]  Coenzyme Q10 (CO Q-10) 100 MG CAPS Take 100 mg by mouth daily.   Yes [provider]  EPINEPHrine 0.3 mg/0.3 mL IJ SOAJ injection Inject 0.3 mg into the muscle as needed. 05/15/20  Yes [provider]  ergocalciferol (VITAMIN D2) 50000 UNITS capsule Take 50,000 Units by mouth once a week.   Yes [provider]  famotidine (PEPCID) 20 MG tablet TAKE  1 TABLET 3 TIMES  DAILY (BEFORE BREAKFAST AND DINNER AND ANOTHER ONE BEFORE BEDTIME) 10/12/20  Yes Nandigam, Venia Minks, MD  FLUoxetine (PROZAC) 20 MG capsule Take 20 mg by mouth daily. 11/18/20  Yes [provider]  HYDROcodone-acetaminophen (NORCO) 7.5-325 MG per tablet Take 1 tablet by mouth 2 (two) times daily. For pain   Yes [provider]  hyoscyamine (LEVSIN SL) 0.125 MG SL tablet DISSOLVE 1 TABLET UNDER THE TONGUE EVERY 6 HOURS AS NEEDED 11/04/20  Yes Nandigam, Kavitha V, MD  ipratropium (ATROVENT) 0.06 % nasal spray Place into both nostrils as needed. 01/18/18  Yes [provider]  levothyroxine (SYNTHROID, LEVOTHROID) 50 MCG tablet Take 50 mcg by mouth daily before breakfast. Once daily 08/07/13  Yes [provider]  loratadine (CLARITIN) 10 MG tablet Take 1 tablet (10 mg total) by mouth in the morning and at bedtime. 07/08/20  Yes Rachel Shaggy, MD  LORazepam (ATIVAN) 1 MG tablet Take 1 tablet by mouth at bedtime. 05/17/14  Yes [provider]  losartan (COZAAR) 100 MG tablet Take 100 mg by mouth daily.   Yes [provider]  miconazole (MICOTIN) 2 % cream Apply 1 application topically 2 (two) times daily. 06/25/20  Yes Nandigam, Venia Minks, MD  Multiple Vitamins-Minerals (PRESERVISION AREDS) TABS Take 1 tablet by mouth daily.   Yes [provider]  SUMAtriptan (IMITREX) 50 MG tablet Take 50 mg by mouth every 2 (two) hours as needed. For migraine   Yes [provider]  triamcinolone (KENALOG) 0.025 % ointment Apply 1 application topically 2 (two) times daily. 06/12/20  Yes Rachel Shaggy, MD  albuterol (VENTOLIN HFA) 108 (90 Base) MCG/ACT inhaler TAKE 2 PUFFS BY MOUTH EVERY 6 HOURS AS NEEDED FOR WHEEZE OR SHORTNESS OF BREATH Patient not taking: Reported on 11/26/2020 05/16/19   Rigoberto Noel, MD  cyanocobalamin 100 MCG tablet Take 100 mcg by mouth daily. Patient not taking: Reported on 11/26/2020    [provider]  DULoxetine (CYMBALTA) 30 MG capsule Take 30 mg by mouth daily. Patient not taking: Reported on 11/26/2020    [provider]  hydrOXYzine (ATARAX/VISTARIL) 25 MG tablet Take 25 mg by mouth as needed. Patient not taking: Reported on 11/26/2020 02/13/20   [provider]  primidone (MYSOLINE) 50 MG tablet Take 1 tablet (50 mg total) by mouth at bedtime. Patient not taking: Reported on 11/26/2020 07/08/20   Kathrynn Ducking, MD     Blood pressure (!) 140/83, pulse 72, height 5' (1.524 m), weight 134 lb 9.6 oz (61.1 kg). Exam: NAD, pleasant, lovely, tangential               Speech:    Speech is normal; fluent and spontaneous with normal comprehension.  Cognition:    The patient is oriented to person, place, and time;     recent and remote memory intact;     language fluent;    Cranial Nerves:     11/15/2021    4:24 PM 11/26/2020    3:24 PM 07/20/2016    2:29 PM  MMSE - Mini Mental State Exam  Orientation to time '4 5 5  '$ Orientation to Place '5 5 5  '$ Registration '3 3 3  '$ Attention/ Calculation '5 4 5  '$ Recall '3 3 3  '$ Language- name 2 objects '2 2 2  '$ Language- repeat '1 1 1  '$ Language- follow 3 step command '3 3 3  '$ Language- read & follow direction '1 1 1  '$ Write a sentence 1 1  1  Copy design 0 1 1  Total score '28 29 30       '$ The pupils are equal, round, and reactive to light.Trigeminal sensation is intact and the muscles of mastication are normal. The face is symmetric. The palate elevates in the midline. Hearing intact. Voice is normal. Shoulder shrug is normal. The tongue has normal motion without fasciculations.   Coordination:  No dysmetria  Motor Observation:    No asymmetry, no atrophy, action and postural high freq and low amp tremor of the hands Tone:    Normal muscle tone.     Strength:    Strength is equal in the upper and lower limbs.      Sensation: intact to LT      Assessment/Plan:81 y.o. female here for a new issue possible TIA.  She has already been seen in our practice in the past by Dr. Jannifer Choi who retired. She has a PMHx of anxiety, asthma, CKD(BUN and creat was recently normal), urticaria(on zolair in the past per patient) depression,HLD, HTN, memory loss, tremor, migraines, stress, prior TIA's per patient. She is on ASA '81mg'$ , MMSE 30/30 12/2020 per [facility] notes, patient also states she had a normal neurocognitive test 13 years ago with Dr. Valentina Choi (I can't find it but she reports it was better than normal for her age). She doesn't remember the event she was referred here for, but she fell in July and CT was unremarkable but appears she had trauma to her head and she is still reporting decreasing memory, we can get an MRi of the brain to ensure she did not have a stroke during the episode she was referred here for and also due to her persistent memory complaints but she does report chronic pain and depression and anxiety. She declines anymore formal neurocognitive testing.   1.  Tremor, action type: likely benign essential tremor. States she stopped the primidone Dr. Jannifer Choi gave her because she did not need it, however it did help.   2. Memory and episode of confusion and word difficulty: will order MRI of the brain to evaluate for strokes and progression of white matter changes, she says she had a normal neurocognitive test 13 years ago with Dr. Valentina Choi. She declines another one. She may have had a slight concussion after her fall, unclear, difficult to assess at this time with patient. MMSE today 28/30, a year ago 29/30, she is teary talking about her depression  Orders Placed This Encounter  Procedures   MR BRAIN WO CONTRAST     Sarina Ill, MD 11/15/2021 4:50 PM  [facility] [address] [place], [place] [address]  Phone [phone] Fax [phone]  I spent over 45 minutes of face-to-face and non-face-to-face time with patient on the  1. memory complaints   2. Aphasia   3.  Episode of confusion    diagnosis.  This included previsit chart review, lab review, study review, order entry, electronic health record documentation, patient education on the different diagnostic and therapeutic options, counseling and coordination of care, risks and benefits of management, compliance, or risk factor reduction

## 2021-11-15 NOTE — Patient Instructions (Signed)
MRi of the brain

## 2021-11-16 ENCOUNTER — Telehealth: Payer: Self-pay | Admitting: Neurology

## 2021-11-16 NOTE — Telephone Encounter (Signed)
Medicare/[employer] order sent to GI, NPR they will reach out to the patient to schedule.

## 2021-11-16 NOTE — Telephone Encounter (Signed)
Spoke with patient and provided emotional support. Her behavior was just fine from my short interaction with her in the office yesterday. Pt wanted message passed to Dr Jaynee Eagles that she apologizes for talking too much about herself yesterday. Reassured patient we are here for her. Her questions were answered. She states she had a bad day with her depression today. States she has a lot to deal with at home and it has been that way for years. She denies having any plans to harm herself. She says she has had intermittent thoughts of suicide for years. Has had depression since 81 years old. She has a psychiatrist and she plans to continue to follow with him. She was very appreciative for the call.

## 2021-11-16 NOTE — Telephone Encounter (Signed)
Pt called needing to speak to the RN regarding her behavior  yesterday at her appt. She would like to be called back on her Cell phone.

## 2021-11-22 DIAGNOSIS — M25532 Pain in left wrist: Secondary | ICD-10-CM | POA: Diagnosis not present

## 2021-11-22 DIAGNOSIS — S60212A Contusion of left wrist, initial encounter: Secondary | ICD-10-CM | POA: Diagnosis not present

## 2021-11-25 ENCOUNTER — Ambulatory Visit
Admission: RE | Admit: 2021-11-25 | Discharge: 2021-11-25 | Disposition: A | Discharge status: Home / Self Care | Payer: Medicare Other | Source: Ambulatory Visit | Attending: Neurology | Admitting: Neurology

## 2021-11-25 DIAGNOSIS — R4701 Aphasia: Secondary | ICD-10-CM | POA: Diagnosis not present

## 2021-11-25 DIAGNOSIS — R4189 Other symptoms and signs involving cognitive functions and awareness: Secondary | ICD-10-CM

## 2021-11-25 DIAGNOSIS — R410 Disorientation, unspecified: Secondary | ICD-10-CM

## 2021-11-29 ENCOUNTER — Telehealth: Payer: Self-pay

## 2021-11-29 NOTE — Telephone Encounter (Signed)
I called patient.  I advised her of her normal for age MRI brain result.  Patient would like a copy of this result mailed to her.  I verified that patient's address on file is correct.  Patient verbalized understanding of results.  Patient had no further questions at this time.

## 2021-11-29 NOTE — Telephone Encounter (Signed)
-----   Message from Melvenia Beam, MD sent at 11/29/2021 12:46 PM EDT ----- MRi of the brain is normal for age thanks

## 2021-11-29 NOTE — Progress Notes (Signed)
See telephone note from 11/29/21.

## 2021-12-08 DIAGNOSIS — E7800 Pure hypercholesterolemia, unspecified: Secondary | ICD-10-CM | POA: Diagnosis not present

## 2021-12-08 DIAGNOSIS — F3341 Major depressive disorder, recurrent, in partial remission: Secondary | ICD-10-CM | POA: Diagnosis not present

## 2021-12-08 DIAGNOSIS — M797 Fibromyalgia: Secondary | ICD-10-CM | POA: Diagnosis not present

## 2021-12-08 DIAGNOSIS — G894 Chronic pain syndrome: Secondary | ICD-10-CM | POA: Diagnosis not present

## 2021-12-08 DIAGNOSIS — N1831 Chronic kidney disease, stage 3a: Secondary | ICD-10-CM | POA: Diagnosis not present

## 2021-12-08 DIAGNOSIS — I10 Essential (primary) hypertension: Secondary | ICD-10-CM | POA: Diagnosis not present

## 2021-12-08 DIAGNOSIS — Z23 Encounter for immunization: Secondary | ICD-10-CM | POA: Diagnosis not present

## 2021-12-08 DIAGNOSIS — E039 Hypothyroidism, unspecified: Secondary | ICD-10-CM | POA: Diagnosis not present

## 2021-12-08 DIAGNOSIS — I700 Atherosclerosis of aorta: Secondary | ICD-10-CM | POA: Diagnosis not present

## 2021-12-08 DIAGNOSIS — R300 Dysuria: Secondary | ICD-10-CM | POA: Diagnosis not present

## 2021-12-08 DIAGNOSIS — Z0000 Encounter for general adult medical examination without abnormal findings: Secondary | ICD-10-CM | POA: Diagnosis not present

## 2021-12-23 DIAGNOSIS — G894 Chronic pain syndrome: Secondary | ICD-10-CM | POA: Diagnosis not present

## 2022-01-30 ENCOUNTER — Other Ambulatory Visit: Payer: Self-pay | Admitting: Gastroenterology

## 2022-01-30 DIAGNOSIS — K219 Gastro-esophageal reflux disease without esophagitis: Secondary | ICD-10-CM

## 2022-02-02 DIAGNOSIS — N3000 Acute cystitis without hematuria: Secondary | ICD-10-CM | POA: Diagnosis not present

## 2022-02-02 DIAGNOSIS — R350 Frequency of micturition: Secondary | ICD-10-CM | POA: Diagnosis not present

## 2022-02-14 ENCOUNTER — Telehealth: Payer: Self-pay | Admitting: Internal Medicine

## 2022-02-14 NOTE — Telephone Encounter (Signed)
Received a call to the [place] on call pager. Patient called because 2 nights ago she took a lot of stool softeners and Dulcolax, and then she developed suprapubic ab pain and vomited last night. She then had 6 large BMs today, starting out as hard rocks followed by mushy and watery stools. Her ab pain has now improved significantly after passing the BMs. She typically has constipation and has a BM once a week. She called her PCP yesterday who told her that she needs to go to the ED because he was worried about a bowel blockage. She did not want to go to the ED and said that she would call the GI office today. Denies fevers or N&V. She has been able to eat today. Will CC Dr. Silverio Decamp to this telephone note. Patient plans to see her PCP tomorrow for appt to get checked out. Beth, please call the patient tomorrow see if the patient is doing okay. Will CC Dr. Silverio Decamp and her nurse Eustaquio Maize to this telephone note.

## 2022-02-15 DIAGNOSIS — G894 Chronic pain syndrome: Secondary | ICD-10-CM | POA: Diagnosis not present

## 2022-02-15 DIAGNOSIS — R0609 Other forms of dyspnea: Secondary | ICD-10-CM | POA: Diagnosis not present

## 2022-02-15 DIAGNOSIS — I10 Essential (primary) hypertension: Secondary | ICD-10-CM | POA: Diagnosis not present

## 2022-02-15 DIAGNOSIS — K5900 Constipation, unspecified: Secondary | ICD-10-CM | POA: Diagnosis not present

## 2022-02-15 NOTE — Telephone Encounter (Signed)
Called the patient to follow up. No answer. Left a message of my call. Also left message asking she call back to schedule a follow up appointment. Last seen May 2023. Did not follow up as planned.

## 2022-03-08 ENCOUNTER — Ambulatory Visit: Payer: Medicare Other | Admitting: Nurse Practitioner

## 2022-03-23 ENCOUNTER — Other Ambulatory Visit: Payer: Self-pay | Admitting: Internal Medicine

## 2022-03-23 DIAGNOSIS — Z1231 Encounter for screening mammogram for malignant neoplasm of breast: Secondary | ICD-10-CM

## 2022-03-24 DIAGNOSIS — F411 Generalized anxiety disorder: Secondary | ICD-10-CM | POA: Diagnosis not present

## 2022-03-24 DIAGNOSIS — N1831 Chronic kidney disease, stage 3a: Secondary | ICD-10-CM | POA: Diagnosis not present

## 2022-03-24 DIAGNOSIS — R350 Frequency of micturition: Secondary | ICD-10-CM | POA: Diagnosis not present

## 2022-03-24 DIAGNOSIS — G894 Chronic pain syndrome: Secondary | ICD-10-CM | POA: Diagnosis not present

## 2022-03-24 DIAGNOSIS — I10 Essential (primary) hypertension: Secondary | ICD-10-CM | POA: Diagnosis not present

## 2022-03-24 DIAGNOSIS — E7800 Pure hypercholesterolemia, unspecified: Secondary | ICD-10-CM | POA: Diagnosis not present

## 2022-03-24 DIAGNOSIS — M797 Fibromyalgia: Secondary | ICD-10-CM | POA: Diagnosis not present

## 2022-03-24 DIAGNOSIS — E039 Hypothyroidism, unspecified: Secondary | ICD-10-CM | POA: Diagnosis not present

## 2022-03-24 DIAGNOSIS — F331 Major depressive disorder, recurrent, moderate: Secondary | ICD-10-CM | POA: Diagnosis not present

## 2022-03-24 DIAGNOSIS — I700 Atherosclerosis of aorta: Secondary | ICD-10-CM | POA: Diagnosis not present

## 2022-03-27 DIAGNOSIS — E039 Hypothyroidism, unspecified: Secondary | ICD-10-CM | POA: Diagnosis not present

## 2022-03-27 DIAGNOSIS — N1831 Chronic kidney disease, stage 3a: Secondary | ICD-10-CM | POA: Diagnosis not present

## 2022-03-27 DIAGNOSIS — I10 Essential (primary) hypertension: Secondary | ICD-10-CM | POA: Diagnosis not present

## 2022-03-27 DIAGNOSIS — E7800 Pure hypercholesterolemia, unspecified: Secondary | ICD-10-CM | POA: Diagnosis not present

## 2022-03-27 DIAGNOSIS — F331 Major depressive disorder, recurrent, moderate: Secondary | ICD-10-CM | POA: Diagnosis not present

## 2022-03-30 ENCOUNTER — Other Ambulatory Visit: Payer: Self-pay | Admitting: Gastroenterology

## 2022-03-30 DIAGNOSIS — K219 Gastro-esophageal reflux disease without esophagitis: Secondary | ICD-10-CM

## 2022-04-11 DIAGNOSIS — M13832 Other specified arthritis, left wrist: Secondary | ICD-10-CM | POA: Diagnosis not present

## 2022-04-11 DIAGNOSIS — M19011 Primary osteoarthritis, right shoulder: Secondary | ICD-10-CM | POA: Diagnosis not present

## 2022-04-11 DIAGNOSIS — M7501 Adhesive capsulitis of right shoulder: Secondary | ICD-10-CM | POA: Diagnosis not present

## 2022-04-13 ENCOUNTER — Ambulatory Visit: Payer: Medicare Other | Admitting: Adult Health

## 2022-04-21 DIAGNOSIS — B370 Candidal stomatitis: Secondary | ICD-10-CM | POA: Diagnosis not present

## 2022-04-21 DIAGNOSIS — N3000 Acute cystitis without hematuria: Secondary | ICD-10-CM | POA: Diagnosis not present

## 2022-04-21 DIAGNOSIS — R309 Painful micturition, unspecified: Secondary | ICD-10-CM | POA: Diagnosis not present

## 2022-05-09 ENCOUNTER — Ambulatory Visit: Payer: Medicare Other

## 2022-05-13 ENCOUNTER — Ambulatory Visit: Payer: Medicare Other

## 2022-05-16 DIAGNOSIS — M13841 Other specified arthritis, right hand: Secondary | ICD-10-CM | POA: Diagnosis not present

## 2022-05-16 DIAGNOSIS — M19011 Primary osteoarthritis, right shoulder: Secondary | ICD-10-CM | POA: Diagnosis not present

## 2022-05-27 ENCOUNTER — Other Ambulatory Visit: Payer: Self-pay | Admitting: Gastroenterology

## 2022-05-27 DIAGNOSIS — K219 Gastro-esophageal reflux disease without esophagitis: Secondary | ICD-10-CM

## 2022-06-07 DIAGNOSIS — M19011 Primary osteoarthritis, right shoulder: Secondary | ICD-10-CM | POA: Diagnosis not present

## 2022-06-19 ENCOUNTER — Other Ambulatory Visit: Payer: Self-pay | Admitting: Gastroenterology

## 2022-06-19 DIAGNOSIS — K219 Gastro-esophageal reflux disease without esophagitis: Secondary | ICD-10-CM

## 2022-06-24 ENCOUNTER — Ambulatory Visit: Payer: Medicare Other

## 2022-07-26 DIAGNOSIS — E039 Hypothyroidism, unspecified: Secondary | ICD-10-CM | POA: Diagnosis not present

## 2022-07-26 DIAGNOSIS — Z1389 Encounter for screening for other disorder: Secondary | ICD-10-CM | POA: Diagnosis not present

## 2022-07-26 DIAGNOSIS — F411 Generalized anxiety disorder: Secondary | ICD-10-CM | POA: Diagnosis not present

## 2022-07-26 DIAGNOSIS — E7800 Pure hypercholesterolemia, unspecified: Secondary | ICD-10-CM | POA: Diagnosis not present

## 2022-07-26 DIAGNOSIS — F331 Major depressive disorder, recurrent, moderate: Secondary | ICD-10-CM | POA: Diagnosis not present

## 2022-07-26 DIAGNOSIS — I700 Atherosclerosis of aorta: Secondary | ICD-10-CM | POA: Diagnosis not present

## 2022-07-26 DIAGNOSIS — G894 Chronic pain syndrome: Secondary | ICD-10-CM | POA: Diagnosis not present

## 2022-07-26 DIAGNOSIS — I10 Essential (primary) hypertension: Secondary | ICD-10-CM | POA: Diagnosis not present

## 2022-07-26 DIAGNOSIS — N1831 Chronic kidney disease, stage 3a: Secondary | ICD-10-CM | POA: Diagnosis not present

## 2022-07-26 DIAGNOSIS — Z0000 Encounter for general adult medical examination without abnormal findings: Secondary | ICD-10-CM | POA: Diagnosis not present

## 2022-07-26 DIAGNOSIS — Z23 Encounter for immunization: Secondary | ICD-10-CM | POA: Diagnosis not present

## 2022-07-26 DIAGNOSIS — M797 Fibromyalgia: Secondary | ICD-10-CM | POA: Diagnosis not present

## 2022-08-10 ENCOUNTER — Telehealth: Payer: Self-pay | Admitting: Gastroenterology

## 2022-08-10 NOTE — Telephone Encounter (Signed)
Patient called requesting to speak with you regarding a MRCP test she usually has to get done yearly.

## 2022-08-11 ENCOUNTER — Other Ambulatory Visit: Payer: Self-pay

## 2022-08-11 DIAGNOSIS — N390 Urinary tract infection, site not specified: Secondary | ICD-10-CM | POA: Diagnosis not present

## 2022-08-11 DIAGNOSIS — K862 Cyst of pancreas: Secondary | ICD-10-CM

## 2022-08-11 NOTE — Telephone Encounter (Signed)
Left message for patient to call back. Patient reminder was set for today to order & schedule 1 year follow up MRI/MRCP. Order placed & schedulers notified.

## 2022-08-12 ENCOUNTER — Other Ambulatory Visit: Payer: Self-pay

## 2022-08-12 MED ORDER — HYOSCYAMINE SULFATE 0.125 MG SL SUBL: 0.1250 mg | SUBLINGUAL_TABLET | Freq: Three times a day (TID) | SUBLINGUAL | 0 refills | Status: DC | PRN

## 2022-08-12 NOTE — Telephone Encounter (Signed)
Ok to send in RX for hyoscyamine 0.125 mg 1 tab dissolve under tongue every 8 hours as needed #30, no refills.  Please instruct patient to stop taking hyoscyamine if she develops dizziness or lightheadedness as this medication increases the risk of falls and patient's over the age of 66.  Thank you

## 2022-08-12 NOTE — Telephone Encounter (Signed)
Refill sent in for patient & discussed potential medication side effects. Pt verbalized all understanding.

## 2022-08-12 NOTE — Telephone Encounter (Signed)
Patient returned call & plans to schedule 1 year follow up MRI/MRCP once her lab work comes back from PCP regarding her kidney function. She will give [place] a call back next week to discuss. She also asked if refill could be given for hycosamine. Last OV was 06/2021 with Jill Side, NP. Scheduled next OV for 11/08/22 at 3:00 pm with Jill Side, NP. She also asked if her husband could be seen with Dr. Lavon Paganini. He has recent complaints of abdominal pain. States he has a personal history of bowel obstruction & family history of stomach cancer. Patient: Jourden Hopkins, DOB: 09/28/1934. Will route to NP/MD for review.

## 2022-08-12 NOTE — Telephone Encounter (Signed)
Left message for patient to call back  

## 2022-08-15 ENCOUNTER — Ambulatory Visit
Admission: RE | Admit: 2022-08-15 | Discharge: 2022-08-15 | Disposition: A | Discharge status: Home / Self Care | Payer: Medicare Other | Source: Ambulatory Visit | Attending: Internal Medicine | Admitting: Internal Medicine

## 2022-08-15 DIAGNOSIS — Z1231 Encounter for screening mammogram for malignant neoplasm of breast: Secondary | ICD-10-CM

## 2022-08-15 LAB — LAB REPORT - SCANNED: EGFR: 65

## 2022-08-19 ENCOUNTER — Telehealth: Payer: Self-pay | Admitting: Nurse Practitioner

## 2022-08-19 NOTE — Telephone Encounter (Signed)
Patient stated she had her labs drawn GFR was 60. She's concerned about her kidney function since the MRI that needs to be scheduled will have contrast. Advised her I would have PCP office fax over a copy so that provider can review before scheduling MRI. Left voicemail for Natalia Leatherwood with Dr. Gwenevere Abbot office Deboraha Sprang at [place]) & asked that she fax recent labs to [phone].

## 2022-08-19 NOTE — Telephone Encounter (Signed)
Inbound call from patient requesting a call back from [facility] regarding pancreatic test and kidney function results. Please advise, thank you.

## 2022-08-22 NOTE — Telephone Encounter (Signed)
Spoke with Samatha Dr. Gwenevere Abbot office Deboraha Sprang at [place]) & asked that she fax recent labs to [phone].

## 2022-08-22 NOTE — Telephone Encounter (Signed)
Please schedule with any next available provider if he is not established so the appointment is not delayed. Thanks

## 2022-08-23 NOTE — Telephone Encounter (Signed)
Please see notes below: Fax received: Copies made. Copy placed on Dr. Lavon Paganini desk along with copy sent to be scanned into Epic. Please review and advise

## 2022-08-23 NOTE — Telephone Encounter (Signed)
With GFR greater than 60 is fine to proceed with MRI but if she has concerns she can discuss with her PMD.

## 2022-08-23 NOTE — Telephone Encounter (Signed)
Pt notified of Dr. Nandigam recommendations: Pt verbalized understanding with all questions answered.   

## 2022-08-24 NOTE — Telephone Encounter (Signed)
Inbound call from patient calling in regards to previous note.  Please advise. 

## 2022-08-25 NOTE — Telephone Encounter (Signed)
Left message for patient to call back  

## 2022-08-25 NOTE — Telephone Encounter (Signed)
Left message for patient to call back. Refer to alternate phone note 08/10/22.

## 2022-08-26 NOTE — Telephone Encounter (Signed)
Spoke with patient regarding scheduling. She would like to reschedule her appointment to 11/15/22 at 3:00 pm with Jill Side, NP. Her husband would still like to be seen for his occasional abdominal pain & has also been scheduled for 11/15/22 at 2:30 pm with Jill Side, NP. Sooner appointments were offered, however patient stated they already had several doctor appointments and they have a long drive. She has been provided number for radiology scheduling to schedule recall MRI. Advised she call back with any further questions. Pt verbalized all understanding.

## 2022-10-17 ENCOUNTER — Other Ambulatory Visit: Payer: Self-pay | Admitting: Gastroenterology

## 2022-10-20 ENCOUNTER — Telehealth: Payer: Self-pay | Admitting: Nurse Practitioner

## 2022-10-20 MED ORDER — DICYCLOMINE HCL 10 MG PO CAPS: 10.0000 mg | ORAL_CAPSULE | Freq: Three times a day (TID) | ORAL | 0 refills | Status: DC | PRN

## 2022-10-20 NOTE — Telephone Encounter (Signed)
Refill was sent to pharamacy. Pt was notified.

## 2022-10-20 NOTE — Telephone Encounter (Signed)
Inbound call from patient requesting a refill for dicyclomine. States pharmacy has sent a request over but has not received anything. Please advise, thank you.

## 2022-10-24 DIAGNOSIS — R309 Painful micturition, unspecified: Secondary | ICD-10-CM | POA: Diagnosis not present

## 2022-10-28 DIAGNOSIS — R309 Painful micturition, unspecified: Secondary | ICD-10-CM | POA: Diagnosis not present

## 2022-10-30 ENCOUNTER — Other Ambulatory Visit: Payer: Self-pay | Admitting: Gastroenterology

## 2022-10-30 DIAGNOSIS — K219 Gastro-esophageal reflux disease without esophagitis: Secondary | ICD-10-CM

## 2022-11-01 ENCOUNTER — Telehealth: Payer: Self-pay | Admitting: Nurse Practitioner

## 2022-11-01 NOTE — Telephone Encounter (Signed)
Inbound call from patient stating that she thought she had an appointment on 9/24 at 3:00 with [facility] for her pancreas. Patient stated she called them and they stated that she does not have an appointment and they do not have any orders for the MRI. Patient is requesting a call to discuss. Please advise.

## 2022-11-02 NOTE — Telephone Encounter (Signed)
Pt stated that she thought she had an appointment to get her MRI done at [facility] on 11/08/2022 but when she called to confirm she was notified that she did not have an appointment.   [facility] was contacted and was notified that pt does have an active order and that she needs to call and just make the appointment. I requested that [facility] call the pt this AM and please schedule appointment with her.    I followed up with pt and pt did state that their office just called her and that she has an appointment on 11/19/2022 at 4:50 PM.  Pt verbalized understanding with all questions answered.

## 2022-11-08 ENCOUNTER — Ambulatory Visit: Payer: Medicare Other | Admitting: Nurse Practitioner

## 2022-11-15 ENCOUNTER — Ambulatory Visit: Payer: Medicare Other | Admitting: Nurse Practitioner

## 2022-11-16 ENCOUNTER — Encounter: Payer: Self-pay | Admitting: Neurology

## 2022-11-16 ENCOUNTER — Ambulatory Visit (INDEPENDENT_AMBULATORY_CARE_PROVIDER_SITE_OTHER): Payer: Medicare Other | Admitting: Neurology

## 2022-11-16 VITALS — BP 132/79 | HR 72 | Ht 64.0 in | Wt 138.0 lb

## 2022-11-16 DIAGNOSIS — R269 Unspecified abnormalities of gait and mobility: Secondary | ICD-10-CM

## 2022-11-16 DIAGNOSIS — G8929 Other chronic pain: Secondary | ICD-10-CM | POA: Diagnosis not present

## 2022-11-16 DIAGNOSIS — R296 Repeated falls: Secondary | ICD-10-CM

## 2022-11-16 DIAGNOSIS — R29898 Other symptoms and signs involving the musculoskeletal system: Secondary | ICD-10-CM

## 2022-11-16 DIAGNOSIS — R2689 Other abnormalities of gait and mobility: Secondary | ICD-10-CM | POA: Diagnosis not present

## 2022-11-16 DIAGNOSIS — M542 Cervicalgia: Secondary | ICD-10-CM

## 2022-11-16 NOTE — Patient Instructions (Addendum)
MRI of the brain and cervical spine without contrast Physical therapy nearest to [place] or [place] for balance and strength exercises  Fall Prevention in the Home, Adult Falls can cause injuries and affect people of all ages. There are many simple things that you can do to make your home safe and to help prevent falls. If you need it, ask for help making these changes. What actions can I take to prevent falls? General information Use good lighting in all rooms. Make sure to: Replace any light bulbs that burn out. Turn on lights if it is dark and use night-lights. Keep items that you use often in easy-to-reach places. Lower the shelves around your home if needed. Move furniture so that there are clear paths around it. Do not keep throw rugs or other things on the floor that can make you trip. If any of your floors are uneven, fix them. Add color or contrast paint or tape to clearly mark and help you see: Grab bars or handrails. First and last steps of staircases. Where the edge of each step is. If you use a ladder or stepladder: Make sure that it is fully opened. Do not climb a closed ladder. Make sure the sides of the ladder are locked in place. Have someone hold the ladder while you use it. Know where your pets are as you move through your home. What can I do in the bathroom?     Keep the floor dry. Clean up any water that is on the floor right away. Remove soap buildup in the bathtub or shower. Buildup makes bathtubs and showers slippery. Use non-skid mats or decals on the floor of the bathtub or shower. Attach bath mats securely with double-sided, non-slip rug tape. If you need to sit down while you are in the shower, use a non-slip stool. Install grab bars by the toilet and in the bathtub and shower. Do not use towel bars as grab bars. What can I do in the bedroom? Make sure that you have a light by your bed that is easy to reach. Do not use any sheets or blankets on your  bed that hang to the floor. Have a firm bench or chair with side arms that you can use for support when you get dressed. What can I do in the kitchen? Clean up any spills right away. If you need to reach something above you, use a sturdy step stool that has a grab bar. Keep electrical cables out of the way. Do not use floor polish or wax that makes floors slippery. What can I do with my stairs? Do not leave anything on the stairs. Make sure that you have a light switch at the top and the bottom of the stairs. Have them installed if you do not have them. Make sure that there are handrails on both sides of the stairs. Fix handrails that are broken or loose. Make sure that handrails are as long as the staircases. Install non-slip stair treads on all stairs in your home if they do not have carpet. Avoid having throw rugs at the top or bottom of stairs, or secure the rugs with carpet tape to prevent them from moving. Choose a carpet design that does not hide the edge of steps on the stairs. Make sure that carpet is firmly attached to the stairs. Fix any carpet that is loose or worn. What can I do on the outside of my home? Use bright outdoor lighting. Repair the edges of  walkways and driveways and fix any cracks. Clear paths of anything that can make you trip, such as tools or rocks. Add color or contrast paint or tape to clearly mark and help you see high doorway thresholds. Trim any bushes or trees on the main path into your home. Check that handrails are securely fastened and in good repair. Both sides of all steps should have handrails. Install guardrails along the edges of any raised decks or porches. Have leaves, snow, and ice cleared regularly. Use sand, salt, or ice melt on walkways during winter months if you live where there is ice and snow. In the garage, clean up any spills right away, including grease or oil spills. What other actions can I take? Review your medicines with your health  care provider. Some medicines can make you confused or feel dizzy. This can increase your chance of falling. Wear closed-toe shoes that fit well and support your feet. Wear shoes that have rubber soles and low heels. Use a cane, walker, scooter, or crutches that help you move around if needed. Talk with your provider about other ways that you can decrease your risk of falls. This may include seeing a physical therapist to learn to do exercises to improve movement and strength. Where to find more information Centers for Disease Control and Prevention, [employer]: TonerPromos.no [employer] on Aging: BaseRingTones.pl [employer] Institute on Aging: BaseRingTones.pl Contact a health care provider if: You are afraid of falling at home. You feel weak, drowsy, or dizzy at home. You fall at home. Get help right away if you: Lose consciousness or have trouble moving after a fall. Have a fall that causes a head injury. These symptoms may be an emergency. Get help right away. Call 911. Do not wait to see if the symptoms will go away. Do not drive yourself to the hospital. This information is not intended to replace advice given to you by your health care provider. Make sure you discuss any questions you have with your health care provider. Document Revised: 10/04/2021 Document Reviewed: 10/04/2021 Elsevier Patient Education  2024 [employer].

## 2022-11-16 NOTE — Progress Notes (Addendum)
Reason for visit: confusion episode with word difficulty and fogginess, unclear etiology, evaluate for TIA  10/2/204: Here for follow up last seen approx 1 year ago, MRI of the brain was normal for age. Fallen twice. Imbalanced, fell and hit her head on the night stand. Sh ehas droopy eyes and she has had eyelid surgery t wake. She is taking levothyroxine for her thyroid and her pcp refills. She has tripped because her closet is unorganized and she tripped and hit her head on the dresser she was in her closet and trying to reach and when she backed up she fell over bc of a shoe. She feels like she has worsened since then, something is worse. This was 4 months ago. She wonders if she has MS she has right arm pain and saw Dr. Amanda Pea evaluated her and her right arm droops an dher posture is worse. She has neck pain. Patient complains of symptoms per HPI as well as the following symptoms: none . Pertinent negatives and positives per HPI. All others negative  11/26/2021: reviewed images as below and agree   IMPRESSION: MRI scan of the brain without contrast showing mild changes of age-appropriate chronic small vessel disease and generalized cerebral atrophy.  No acute abnormalities are noted.  These appear slightly progressed compared with previous MRI dated 06/03/2019.    reviewed blood work:     Latest Ref Rng & Units 08/24/2021    4:46 PM 07/13/2021    3:56 PM 03/25/2020    4:53 PM  CMP  Glucose 70 - 99 mg/dL 098  80  119   BUN 8 - 23 mg/dL 11  16  16    Creatinine 0.44 - 1.00 mg/dL 1.47  8.29  5.62   Sodium 135 - 145 mmol/L 137  139  138   Potassium 3.5 - 5.1 mmol/L 4.1  4.0  4.2   Chloride 98 - 111 mmol/L 105  102  102   CO2 22 - 32 mmol/L 27  29  22    Calcium 8.9 - 10.3 mg/dL 9.6  13.0  86.5   Total Protein 6.0 - 8.5 g/dL   6.6   Total Bilirubin 0.0 - 1.2 mg/dL   0.3   Alkaline Phos 44 - 121 IU/L   81   AST 0 - 40 IU/L   21   ALT 0 - 32 IU/L   13       Latest Ref Rng & Units  08/24/2021    4:46 PM 06/12/2020    4:41 PM 03/25/2020    4:53 PM  CBC  WBC 4.0 - 10.5 K/uL 4.9  8.1  6.9   Hemoglobin 12.0 - 15.0 g/dL 78.4  69.6  29.5   Hematocrit 36.0 - 46.0 % 37.6  40.9  40.0   Platelets 150 - 400 K/uL 190        11/16/2021: Telephone note Dr. Lucia Gaskins: Spoke with patient and provided emotional support. Her behavior was just fine from my short interaction with her in the office yesterday. Pt wanted message passed to Dr Lucia Gaskins that she apologizes for talking too much about herself yesterday. Reassured patient we are here for her. Her questions were answered. She states she had a bad day with her depression today. States she has a lot to deal with at home and it has been that way for years. She denies having any plans to harm herself. She says she has had intermittent thoughts of suicide for years. Has had depression  since 82 years old. She has a psychiatrist and she plans to continue to follow with him. She was very appreciative for the call.   HPI: 11/15/2021: [name] is an 82 y.o. female here for a new issue possible TIA. She has already been seen in our practice in the past by Dr. Anne Hahn who retired. She has a PMHx of anxiety, asthma, CKD(BUN and creat was recentky normal), urticaria(on zolair in the past) depression,HLD, HTN, memory loss, tremor, migraines, stress, prior TIA's per patient. She is on ASA 81mg , MMSE 30/30 12/2020 per [facility], I reviewed [facility] per [facility] team, she had a confusion episode with word difficulty and fogginess, unclear etiology, differential was TIA, and continued on aspirin.  Patient reported she had a memory lapse, felt confusion and difficulty with word finding after breakfast, possibly was after taking pain meds, confusion lasted for a few hours, patient had a CT of the head in 2019 that was unremarkable, 2016 MRI showed chronic small vessel microvascular disease, the episode of confusion and some difficulty with word finding lasted for about a  few hours, she had no trouble swallowing, no weakness in the upper extremities or lower extremities, no severe headaches, she has a history of migraines but this was different, she has had UTIs in the past, urinalysis did show trace leukoesterase but no infection.Patient had  a mechanical fall 08/24/2021, reviewed ED notes, CT Head was unremarkable. MRi in 2016 showed normal for age microvascular white matter changes but no old ischemic events.  Patient does not remember having an episode of confusion. She states she never smoked and never drank. She reports she had a mechanical fall and she says her memory has been going downhill. She has been complaining of memory problems for several years, she states since she hit her head in July she saus her memory is worse. She states she saw Dr. Leonides Cave 13 years ago and had a normal neurocognitive examination. She states there is a lot of stress at home, her husband is 5 years older and almost threw his walker at her, husand has chronic back pain and see Dr. Lovell Sheehan, her husband also had a stroke she thinks and when she got his plate she called the EMTs and took him to the hospital and he had a CVA. So there is a lot of stress. If husband falls, his grandson lives in patient's mother's house and he is an addict and has been to prison. She has a son [name] that is within walking distance to her and he worked at [employer] and he is recovered. The mysoline helped her but she felt like she can be fine without the medication. She had a bad fall, she was sent home with blood still on her face. She has very bad depression, she can't take medications but she takes roxicodone and had to take it last night for fibromyalgia.   08/2021: BUN 11 creatinine 0.88 normal  CT head 08/24/2021: CT HEAD FINDINGS   Brain: No evidence of acute infarction, hemorrhage, hydrocephalus, extra-axial collection or mass lesion/mass effect. There is stable mild diffuse atrophy. There is stable  mild periventricular white matter hypodensity, likely chronic small vessel ischemic change. There is an old lacunar infarct in the left basal ganglia, unchanged.   Vascular: No hyperdense vessel or unexpected calcification.   Skull: Normal. Negative for fracture or focal lesion.   Other: Left frontal scalp soft tissue swelling.   MRI brain 01/16/2015: EXAM: MRI Brain with and without  contrast   ORDERING CLINICIAN: Butch Penny NP CLINICAL HISTORY: 82 year old woman with headache and facial numbness COMPARISON FILMS: MRI of the brain 11/17/2008   TECHNIQUE:MRI of the brain with and without contrast was obtained utilizing 5 mm axial slices with T1, T2, T2 flair, SWI and diffusion weighted views.  T1 sagittal, T2 coronal and postcontrast views in the axial and coronal plane were obtained. CONTRAST: 16 ml [employer] IMAGING SITE: [facility], [address] [place].   FINDINGS: On sagittal images, the spinal cord is imaged caudally to C4 and is normal in caliber.   The contents of the posterior fossa are of normal size and position.   The pituitary gland and optic chiasm appear normal.    Mild cortical atrophy is noted..   The ventricles are normal in size for age and without distortion.  There are no abnormal extra-axial collections of fluid.     The cerebellum and brainstem appear normal.   There are many expanded Virchow-Robin spaces adjacent to the basal ganglia bilaterally. In the hemispheres, there are scattered T2/FLAIR hyperintense foci located in the subcortical and deep white matter.  None of these appears acute.  The orbits appear normal.   The VIIth/VIIIth nerve complex appears normal.  The mastoid air cells appear normal.  The paranasal sinuses appear normal.  Flow voids are identified within the major intracerebral arteries.      Diffusion weighted images are normal.  Susceptibility weighted images are normal.   After the infusion of contrast material, a normal enhancement  pattern is noted.   The study was compared to the MRI of the brain dated 11/17/2008. In the interim, several more T2/FLAIR hyperintense foci in the upper cortical deep white matter have developed.     IMPRESSION:  This MRI of the brain with and without contrast shows the following: 1. Mild cortical atrophy that is probably within normal limits for age. 2.  Scattered T2/FLAIR hyperintense foci in the subcortical deep white matter of both hemispheres consistent with chronic microvascular ischemic changes. The extent is mildly more than expected for age. When compared to the MRI dated 11/17/2008, some of these have developed during the interim, though none appears to be acute on the current study. 3.  There are no acute findings.  Patient complains of symptoms per HPI as well as the following symptoms: depression . Pertinent negatives and positives per HPI. All others negative    History of present illness:11/26/2020 Dr. Anne Hahn' notes below  Ms. Wiesman is a 82 year old right-handed white female with a history of a tremor.  She has occasional headaches, she has diffuse neuromuscular discomfort and may have fibromyalgia.  She does report a mild memory problem.  She has significant issues with depression, she cares for her husband who is having a lot of medical issues.  Husband may be getting demented with confusion and urinary incontinence.  The patient came off of Diamox for her tremor due to some stomach upset.  The tremor seems to be a bit worse in the morning.  She has noted some changes in her handwriting but she is still able to generate handwriting fairly well.  There is no resting component to the tremor.  Past Medical History:  Diagnosis Date   ADD (attention deficit disorder)    Anxiety    Arthritis    Asthma    Chronic kidney disease    CKD Stage 3   Chronic renal insufficiency, stage III (moderate) (HCC)    COVID-19  Depression    major depression   Dermatitis     Diverticulosis    Essential tremor 05/17/2012   Fibromyalgia    GERD (gastroesophageal reflux disease)    Hyperlipidemia    Hypertension    Memory loss 05/17/2012   Migraines    Occasional tremors    Pneumonia    TIA (transient ischemic attack)    History of 2 TIA's per pt   Tremor     Past Surgical History:  Procedure Laterality Date   ABDOMINAL HYSTERECTOMY     APPENDECTOMY     CATARACT EXTRACTION Bilateral    TONSILLECTOMY AND ADENOIDECTOMY     age 85   TOTAL HIP ARTHROPLASTY Bilateral 2009   Dr Lajoyce Corners    Family History  Problem Relation Age of Onset   Liver cancer Mother    Pulmonary embolism Mother    Prostate cancer Father    Depression Brother        suicide   Lung cancer Maternal Aunt    ALS Paternal Aunt    Brain cancer Paternal Aunt    Breast cancer Paternal Aunt    Diabetes Paternal Aunt        x 2   Prostate cancer Paternal Uncle        x 2   Stroke Maternal Grandmother    Heart failure Maternal Grandfather    Emphysema Maternal Grandfather    Congestive Heart Failure Maternal Grandfather    Coronary artery disease Paternal Grandmother    Stroke Paternal Grandmother    Coronary artery disease Paternal Grandfather    Breast cancer Cousin    Colon cancer Neg Hx    Esophageal cancer Neg Hx    Rectal cancer Neg Hx    Stomach cancer Neg Hx     Social history:  reports that she has never smoked. She has never used smokeless tobacco. She reports that she does not drink alcohol and does not use drugs.    Allergies  Allergen Reactions   Crestor [Rosuvastatin Calcium]     myalgia   Zocor [Simvastatin]     myalgia   Enalapril     swelling   Lamictal [Lamotrigine]     rash   Nitrofuran Derivatives Nausea Only   Erythromycin Other (See Comments)    Stomach burning   Methylprednisolone     Major depression, weakness, nausea   Nystatin Other (See Comments)    Blisters on her lips   Other     Allergic to certain manufacturers for Hydrocodone  (Tris [employer] Mallencrot) causes her to feel suicidal   Vivelle [Estradiol]     rash   Zetia [Ezetimibe]     Muscle weakness   Ciprofloxacin Nausea Only   Ciprofloxacin Hcl     rash   Latex Rash   Shellfish Allergy Rash   Sulfa Antibiotics Nausea Only   Sulfasalazine Nausea Only    Medications:  Prior to Admission medications   Medication Sig Start Date End Date Taking? Authorizing Provider  aspirin EC 81 MG tablet Take 81 mg by mouth daily.   Yes [provider]  bismuth subsalicylate (PEPTO BISMOL) 262 MG/15ML suspension Take 30 mLs by mouth every 6 (six) hours as needed.   Yes [provider]  Coenzyme Q10 (CO Q-10) 100 MG CAPS Take 100 mg by mouth daily.   Yes [provider]  EPINEPHrine 0.3 mg/0.3 mL IJ SOAJ injection Inject 0.3 mg into the muscle as needed. 05/15/20  Yes [provider]  ergocalciferol (VITAMIN D2) 50000 UNITS capsule Take 50,000 Units by mouth once a week.   Yes [provider]  famotidine (PEPCID) 20 MG tablet TAKE 1 TABLET 3 TIMES DAILY (BEFORE BREAKFAST AND DINNER AND ANOTHER ONE BEFORE BEDTIME) 10/12/20  Yes Nandigam, Eleonore Chiquito, MD  FLUoxetine (PROZAC) 20 MG capsule Take 20 mg by mouth daily. 11/18/20  Yes [provider]  HYDROcodone-acetaminophen (NORCO) 7.5-325 MG per tablet Take 1 tablet by mouth 2 (two) times daily. For pain   Yes [provider]  hyoscyamine (LEVSIN SL) 0.125 MG SL tablet DISSOLVE 1 TABLET UNDER THE TONGUE EVERY 6 HOURS AS NEEDED 11/04/20  Yes Nandigam, Kavitha V, MD  ipratropium (ATROVENT) 0.06 % nasal spray Place into both nostrils as needed. 01/18/18  Yes [provider]  levothyroxine (SYNTHROID, LEVOTHROID) 50 MCG tablet Take 50 mcg by mouth daily before breakfast. Once daily 08/07/13  Yes [provider]  loratadine (CLARITIN) 10 MG tablet Take 1 tablet (10 mg total) by mouth in the morning and at bedtime. 07/08/20  Yes Alfonse Spruce, MD  LORazepam (ATIVAN)  1 MG tablet Take 1 tablet by mouth at bedtime. 05/17/14  Yes [provider]  losartan (COZAAR) 100 MG tablet Take 100 mg by mouth daily.   Yes [provider]  miconazole (MICOTIN) 2 % cream Apply 1 application topically 2 (two) times daily. 06/25/20  Yes Nandigam, Eleonore Chiquito, MD  Multiple Vitamins-Minerals (PRESERVISION AREDS) TABS Take 1 tablet by mouth daily.   Yes [provider]  SUMAtriptan (IMITREX) 50 MG tablet Take 50 mg by mouth every 2 (two) hours as needed. For migraine   Yes [provider]  triamcinolone (KENALOG) 0.025 % ointment Apply 1 application topically 2 (two) times daily. 06/12/20  Yes Alfonse Spruce, MD  albuterol (VENTOLIN HFA) 108 (90 Base) MCG/ACT inhaler TAKE 2 PUFFS BY MOUTH EVERY 6 HOURS AS NEEDED FOR WHEEZE OR SHORTNESS OF BREATH Patient not taking: Reported on 11/26/2020 05/16/19   Oretha Milch, MD  cyanocobalamin 100 MCG tablet Take 100 mcg by mouth daily. Patient not taking: Reported on 11/26/2020    [provider]  DULoxetine (CYMBALTA) 30 MG capsule Take 30 mg by mouth daily. Patient not taking: Reported on 11/26/2020    [provider]  hydrOXYzine (ATARAX/VISTARIL) 25 MG tablet Take 25 mg by mouth as needed. Patient not taking: Reported on 11/26/2020 02/13/20   [provider]  primidone (MYSOLINE) 50 MG tablet Take 1 tablet (50 mg total) by mouth at bedtime. Patient not taking: Reported on 11/26/2020 07/08/20   York Spaniel, MD     Blood pressure 132/79, pulse 72, height 5\' 4"  (1.626 m), weight 138 lb (62.6 kg). Exam: NAD, pleasant, lovely, tangential               Speech:    Speech is normal; fluent and spontaneous with normal comprehension.  Cognition:    The patient is oriented to person, place, and time;     recent and remote memory intact;     language fluent;    Cranial Nerves:     11/16/2022    3:13 PM 11/15/2021    4:24 PM 11/26/2020    3:24 PM  MMSE - Mini Mental  State Exam  Orientation to time 5 4 5   Orientation to Place 5 5 5   Registration 3 3 3   Attention/ Calculation 5 5 4   Recall 3 3 3   Language- name 2 objects 2  2 2  Language- repeat 1 1 1   Language- follow 3 step command 3 3 3   Language- read & follow direction 1 1 1   Write a sentence 1 1 1   Copy design 1 0 1  Total score 30 28 29        The pupils are equal, round, and reactive to light.Trigeminal sensation is intact and the muscles of mastication are normal. The face is symmetric. The palate elevates in the midline. Hearing intact. Voice is normal. Shoulder shrug is normal. The tongue has normal motion without fasciculations.   Coordination:  No dysmetria  Motor Observation:    No asymmetry, no atrophy, action and postural high freq and low amp tremor of the hands and chin Tone:    Normal muscle tone.     Strength:    Proximal weakness of extremities. Strength is symmetrical in the upper and lower limbs.      Sensation: intact to LT      Assessment/Plan:81 y.o. female here for a new issue possible TIA. She has already been seen in our practice in the past by Dr. Anne Hahn who retired. She has a PMHx of anxiety, asthma, CKD(BUN and creat was recently normal), urticaria(on zolair in the past per patient) depression,HLD, HTN, memory loss, tremor, migraines, stress, prior TIA's per patient. She is on ASA 81mg , MMSE 30/30 12/2020 per [facility] notes, patient also states she had a normal neurocognitive test 13 years ago with Dr. Leonides Cave (I can't find it but she reports it was better than normal for her age). She doesn't remember the event she was referred here for, but she fell in July and CT was unremarkable but appears she had trauma to her head and she is still reporting decreasing memory, we can get an MRi of the brain to ensure she did not have a stroke during the episode she was referred here for and also due to her persistent memory complaints but she does report chronic pain and depression  and anxiety. She declines anymore formal neurocognitive testing.   1.  Tremor, action type: likely benign essential tremor. States she stopped the primidone Dr. Anne Hahn gave her because she did not need it, however it did help.   2. Memory and episode of confusion and word difficulty also recent falls and imbalance : will order repeat MRI of the brain to evaluate for strokes and progression of white matter changes - unlikely dementia given stable MMSE, she says she had a normal neurocognitive test 13 years ago with Dr. Leonides Cave. She declines another one. She may have had a slight concussion after her fall, unclear, difficult to assess at this time with patient. MMSE today 30/30, last year 28/30, a 2 years ago ago 29/30, she is teary talking about her depression  3. Bilateral ptosis about a mm-63mm below the pupile chronic and not variable not concerning for myasthenia gravis; Had MG gravis testing in the past, no changes, chronic, had testin prior to remote eyelid surgery but will check MRi brain.   Naomie Dean, MD 11/20/2022 9:54 PM  [facility] [address] [place], [place] [address]  Phone [phone] Fax [phone]  I spent over 45 minutes of face-to-face and non-face-to-face time with patient on the  1. Falls   2. Gait abnormality   3. Arm weakness   4. Imbalance   5. Chronic neck pain with abnormal neurologic examination     diagnosis.  This included previsit chart review, lab review, study review, order entry,  electronic health record documentation, patient education on the different diagnostic and therapeutic options, counseling and coordination of care, risks and benefits of management, compliance, or risk factor reduction

## 2022-11-19 ENCOUNTER — Ambulatory Visit
Admission: RE | Admit: 2022-11-19 | Discharge: 2022-11-19 | Disposition: A | Discharge status: Home / Self Care | Payer: Medicare Other | Source: Ambulatory Visit | Attending: Gastroenterology | Admitting: Gastroenterology

## 2022-11-19 DIAGNOSIS — K862 Cyst of pancreas: Secondary | ICD-10-CM

## 2022-11-20 NOTE — Addendum Note (Signed)
Addended by: Naomie Dean B on: 11/20/2022 09:54 PM   Modules accepted: Orders

## 2022-11-21 ENCOUNTER — Telehealth: Payer: Self-pay | Admitting: Neurology

## 2022-11-21 NOTE — Telephone Encounter (Signed)
Referral for physical therapy sent through [facility] to [facility]. Phone: [phone]

## 2022-11-21 NOTE — Telephone Encounter (Signed)
Patient called stated she went to get her MRI done but they would not do it because the order needed to be with contrast and they also requested for additional labs to be done.

## 2022-11-22 ENCOUNTER — Telehealth: Payer: Self-pay | Admitting: Neurology

## 2022-11-22 NOTE — Telephone Encounter (Signed)
Pt stated that GI would not do her MRI due to having stage 3 CKD and that she needs updated labs. Pt stated that she is going to Dr. Donette Larry office on Thursday and will have them check labs there and then call GI back to schedule MRI.

## 2022-11-22 NOTE — Telephone Encounter (Signed)
medicare NPR sent to GI [phone] 

## 2022-11-22 NOTE — Telephone Encounter (Signed)
Pt last seen by Alcide Evener NP, routing to appropriate nurse.

## 2022-11-24 DIAGNOSIS — Z23 Encounter for immunization: Secondary | ICD-10-CM | POA: Diagnosis not present

## 2022-11-24 DIAGNOSIS — F331 Major depressive disorder, recurrent, moderate: Secondary | ICD-10-CM | POA: Diagnosis not present

## 2022-11-24 DIAGNOSIS — N1831 Chronic kidney disease, stage 3a: Secondary | ICD-10-CM | POA: Diagnosis not present

## 2022-11-24 DIAGNOSIS — F411 Generalized anxiety disorder: Secondary | ICD-10-CM | POA: Diagnosis not present

## 2022-11-24 DIAGNOSIS — I700 Atherosclerosis of aorta: Secondary | ICD-10-CM | POA: Diagnosis not present

## 2022-11-24 DIAGNOSIS — E039 Hypothyroidism, unspecified: Secondary | ICD-10-CM | POA: Diagnosis not present

## 2022-11-24 DIAGNOSIS — G894 Chronic pain syndrome: Secondary | ICD-10-CM | POA: Diagnosis not present

## 2022-11-24 DIAGNOSIS — I10 Essential (primary) hypertension: Secondary | ICD-10-CM | POA: Diagnosis not present

## 2022-11-24 DIAGNOSIS — E7800 Pure hypercholesterolemia, unspecified: Secondary | ICD-10-CM | POA: Diagnosis not present

## 2022-11-24 DIAGNOSIS — M797 Fibromyalgia: Secondary | ICD-10-CM | POA: Diagnosis not present

## 2022-12-09 DIAGNOSIS — Z23 Encounter for immunization: Secondary | ICD-10-CM | POA: Diagnosis not present

## 2022-12-09 DIAGNOSIS — R350 Frequency of micturition: Secondary | ICD-10-CM | POA: Diagnosis not present

## 2022-12-29 ENCOUNTER — Other Ambulatory Visit: Payer: Medicare Other

## 2023-01-10 ENCOUNTER — Ambulatory Visit
Admission: RE | Admit: 2023-01-10 | Discharge: 2023-01-10 | Disposition: A | Discharge status: Home / Self Care | Payer: Medicare Other | Source: Ambulatory Visit | Attending: Gastroenterology | Admitting: Gastroenterology

## 2023-01-10 DIAGNOSIS — K862 Cyst of pancreas: Secondary | ICD-10-CM | POA: Diagnosis not present

## 2023-01-10 MED ORDER — GADOPICLENOL 0.5 MMOL/ML IV SOLN
7.5000 mL | Freq: Once | INTRAVENOUS | Status: AC | PRN
  Administered 2023-01-10: 7.5 mL via INTRAVENOUS

## 2023-01-15 ENCOUNTER — Ambulatory Visit
Admission: RE | Admit: 2023-01-15 | Discharge: 2023-01-15 | Disposition: A | Discharge status: Home / Self Care | Payer: Medicare Other | Source: Ambulatory Visit | Attending: Neurology | Admitting: Neurology

## 2023-01-15 DIAGNOSIS — G8929 Other chronic pain: Secondary | ICD-10-CM | POA: Diagnosis not present

## 2023-01-15 DIAGNOSIS — R296 Repeated falls: Secondary | ICD-10-CM | POA: Diagnosis not present

## 2023-01-15 DIAGNOSIS — R2689 Other abnormalities of gait and mobility: Secondary | ICD-10-CM | POA: Diagnosis not present

## 2023-01-15 DIAGNOSIS — R29898 Other symptoms and signs involving the musculoskeletal system: Secondary | ICD-10-CM

## 2023-01-15 DIAGNOSIS — M542 Cervicalgia: Secondary | ICD-10-CM | POA: Diagnosis not present

## 2023-01-15 DIAGNOSIS — R269 Unspecified abnormalities of gait and mobility: Secondary | ICD-10-CM

## 2023-01-15 MED ORDER — GADOPICLENOL 0.5 MMOL/ML IV SOLN
6.0000 mL | Freq: Once | INTRAVENOUS | Status: AC | PRN
  Administered 2023-01-15: 6 mL via INTRAVENOUS

## 2023-01-17 ENCOUNTER — Telehealth: Payer: Self-pay | Admitting: *Deleted

## 2023-01-17 NOTE — Telephone Encounter (Addendum)
MRI c-spine Anson Fret, MD 01/16/2023 12:04 PM EST Back to Top    Mild multilevel degenerative changes in the cervical spine as detailed above that do not lead to spinal stenosis or nerve root compression. Nothing to explain her symptoms and nothing significant which is good news. thanks     MRI brain ----- Message from Anson Fret sent at 01/16/2023 12:03 PM EST ----- No change or anything new from MRI a year ago, great news thanks   I called the pt and discussed MRI brain and c-spine results. Pt was appreciative and verbalized understanding. Her questions were answered. She will see Dr Lucia Gaskins for follow-up on 12/18 at 4 pm.

## 2023-01-19 ENCOUNTER — Telehealth: Payer: Self-pay | Admitting: Nurse Practitioner

## 2023-01-19 NOTE — Telephone Encounter (Signed)
Inbound call from patient requesting a call from nurse. Patient did not wish to discuss any further detail. Please advise, thank you

## 2023-01-20 NOTE — Telephone Encounter (Signed)
Spoke with patient & she was calling to find out about her MRI results. We discussed the results & recommendations. She also stated she's been struggling with constipation & that she is overdue for a follow up. Previous appointments were made, but she had to cancel d/t her husband's health. An appointment opened up for Monday, 12/9 at 2:10 with Dr. Lavon Paganini. She will plan to be here, and in the meantime advised to try prunelax OTC as previously recommended at time of last OV 06/2021. Pt verbalized all understanding.

## 2023-01-20 NOTE — Telephone Encounter (Signed)
Unable to reach patient, VM box has not been set up.

## 2023-01-23 ENCOUNTER — Ambulatory Visit: Payer: Medicare Other | Admitting: Gastroenterology

## 2023-01-23 NOTE — Telephone Encounter (Signed)
Inbound call from patient stating her husband is not feeling well and unable to come in. Patient is requesting a call to discuss if there is another appointment available with Dr. Lavon Paganini. Please advise, thank you.

## 2023-01-24 NOTE — Telephone Encounter (Signed)
Pt was contacted in regard to previous message. Pt was offered an appointment on 01/31/2023 at 2:30 with Alcide Evener NP. Pt declined. Pt stated that she would call back and reschedule. Pt verbalized understanding with all questions answered.

## 2023-01-28 DIAGNOSIS — R300 Dysuria: Secondary | ICD-10-CM | POA: Diagnosis not present

## 2023-01-28 DIAGNOSIS — R399 Unspecified symptoms and signs involving the genitourinary system: Secondary | ICD-10-CM | POA: Diagnosis not present

## 2023-01-28 DIAGNOSIS — N390 Urinary tract infection, site not specified: Secondary | ICD-10-CM | POA: Diagnosis not present

## 2023-01-31 ENCOUNTER — Telehealth: Payer: Self-pay | Admitting: Neurology

## 2023-01-31 NOTE — Telephone Encounter (Signed)
Pt r/s appt due to not feeling well. Rescheduled to 06/22/2023 and wait listed but pt is wanting to be worked in sooner if possible. Also, she would like to talk to someone about her MRI results. Requesting call back

## 2023-02-01 ENCOUNTER — Ambulatory Visit: Payer: Medicare Other | Admitting: Neurology

## 2023-02-02 NOTE — Telephone Encounter (Signed)
I called the patient and LVM (ok per DPR) advising that I can get her in sooner, at this moment, we have February 18 at 8:30 AM open.  I also went over her MRI results in detail.  Left office number for call back.

## 2023-02-06 NOTE — Telephone Encounter (Signed)
We have her on the wait list for afternoon appointment.

## 2023-02-06 NOTE — Telephone Encounter (Signed)
Pt returned call and stated that she can not take that early morning appt. Pt would prefer later times.

## 2023-03-15 ENCOUNTER — Telehealth: Payer: Self-pay | Admitting: Nurse Practitioner

## 2023-03-15 NOTE — Telephone Encounter (Signed)
Inbound call from patient stating that she is having issues with constipation. Patient states that she has tried Clinical biochemist and it is not working. Patient is requesting a call to discuss other recommendations to find some relief. Please advise.

## 2023-03-16 NOTE — Telephone Encounter (Signed)
Left message for patient to call back

## 2023-03-17 NOTE — Telephone Encounter (Signed)
Pt was contacted in regard to previous message.  Pt stated that she took some stool softeners today and had a BM. Pt stated that she had a BM yesterday. Pt was encouraged to take Miralax daily, drink adequate water and exercise as tolerated. Pt verbalized understanding with all questions answered.

## 2023-03-17 NOTE — Telephone Encounter (Signed)
 Left message for patient to call back

## 2023-03-17 NOTE — Telephone Encounter (Signed)
 Left message for pt to call back

## 2023-03-19 ENCOUNTER — Other Ambulatory Visit: Payer: Self-pay | Admitting: Nurse Practitioner

## 2023-03-21 ENCOUNTER — Telehealth: Payer: Self-pay | Admitting: Nurse Practitioner

## 2023-03-21 NOTE — Telephone Encounter (Signed)
Inbound call from patient stating she is needing a refill for dicyclomine medication. Also stated she has been constipated and has had abnormal stool. Requesting a call back. Please advise, thank you.

## 2023-03-22 NOTE — Telephone Encounter (Signed)
 Pt stated that she was having constipation.  Pt does have an office visit that has been previously scheduled with Elida Nyle Sharps NP on 05/08/2023 at 3:00 PM. Pt was notified that if she went several days with out having a BM she could take the miralax twice a day. Pt was encouraged to drink lots of water while taking the miralax. Pt stated that typically she drinks very little water during the day and then tries to make up for it at night.  Pt was encouraged to drink the water throughout the day.  Pt verbalized understanding with all questions answered.

## 2023-03-29 DIAGNOSIS — N390 Urinary tract infection, site not specified: Secondary | ICD-10-CM | POA: Diagnosis not present

## 2023-04-16 ENCOUNTER — Other Ambulatory Visit: Payer: Self-pay | Admitting: Nurse Practitioner

## 2023-04-25 ENCOUNTER — Other Ambulatory Visit: Payer: Self-pay | Admitting: Nurse Practitioner

## 2023-05-08 ENCOUNTER — Ambulatory Visit: Payer: PRIVATE HEALTH INSURANCE | Admitting: Nurse Practitioner

## 2023-05-17 DIAGNOSIS — Z96643 Presence of artificial hip joint, bilateral: Secondary | ICD-10-CM | POA: Diagnosis not present

## 2023-06-05 ENCOUNTER — Other Ambulatory Visit: Payer: Self-pay | Admitting: Nurse Practitioner

## 2023-06-05 ENCOUNTER — Other Ambulatory Visit: Payer: Self-pay | Admitting: Gastroenterology

## 2023-06-05 DIAGNOSIS — K219 Gastro-esophageal reflux disease without esophagitis: Secondary | ICD-10-CM

## 2023-06-05 NOTE — Telephone Encounter (Signed)
 Attempted to contact patient and left a voicemail for patient to return call.

## 2023-06-14 DIAGNOSIS — I700 Atherosclerosis of aorta: Secondary | ICD-10-CM | POA: Diagnosis not present

## 2023-06-14 DIAGNOSIS — N1831 Chronic kidney disease, stage 3a: Secondary | ICD-10-CM | POA: Diagnosis not present

## 2023-06-14 DIAGNOSIS — I10 Essential (primary) hypertension: Secondary | ICD-10-CM | POA: Diagnosis not present

## 2023-06-14 DIAGNOSIS — E039 Hypothyroidism, unspecified: Secondary | ICD-10-CM | POA: Diagnosis not present

## 2023-06-14 DIAGNOSIS — E7800 Pure hypercholesterolemia, unspecified: Secondary | ICD-10-CM | POA: Diagnosis not present

## 2023-06-22 ENCOUNTER — Telehealth: Payer: Self-pay | Admitting: Neurology

## 2023-06-22 ENCOUNTER — Encounter: Payer: Self-pay | Admitting: Neurology

## 2023-06-22 ENCOUNTER — Ambulatory Visit (INDEPENDENT_AMBULATORY_CARE_PROVIDER_SITE_OTHER): Payer: Medicare Other | Admitting: Neurology

## 2023-06-22 VITALS — BP 126/76 | HR 74 | Ht 64.0 in | Wt 141.2 lb

## 2023-06-22 DIAGNOSIS — M5412 Radiculopathy, cervical region: Secondary | ICD-10-CM | POA: Diagnosis not present

## 2023-06-22 NOTE — Telephone Encounter (Signed)
Referral for orthopedic fax to [facility]. Phone: [phone], Fax: [phone].

## 2023-06-22 NOTE — Telephone Encounter (Deleted)
 Referral for orthopedic fax to [facility]. Phone: [phone]. Fax: [phone]

## 2023-06-22 NOTE — Progress Notes (Signed)
 Reason for visit: confusion episode with word difficulty and fogginess, unclear etiology, evaluate for TIA  06/22/2023: Since last seeing her for confusion episode and fall, MRI of the brain amd MRI cervical spine showed no etiology. No new strokes or other, no cervical cord disease such as myelopathy which would affect her balance or gait. She is still off balance. Sheis still having pain and is on gabapentin. She has shoulder pain very bad and muscle pain in the left arm. She has left cervical foraminal stenosis C5/C6 which may be causing radiating pain into her proximal left arm.   06/22/2023:  01/15/2023: MRI of the brain was stable:  Mild generalized cortical atrophy, stable compared to the MRI from 11/25/2021. T2/FLAIR hyperintense foci in the hemispheres and pons consistent with mild to moderate chronic microvascular ischemic change, unchanged compared to the MRI from 11/25/2021. No acute findings. Management pattern.  MRI cervical spine:  IMPRESSION: This MRI of the cervical spine without contrast shows the following: The spinal cord appears normal. Mild multilevel degenerative changes in the cervical spine as detailed above that do not lead to spinal stenosis or nerve root compression.    11/16/2022: Here for follow up last seen approx 1 year ago, MRI of the brain was normal for age. Fallen twice. Imbalanced, fell and hit her head on the night stand. Sh ehas droopy eyes and she has had eyelid surgery t wake. She is taking levothyroxine for her thyroid  and her pcp refills. She has tripped because her closet is unorganized and she tripped and hit her head on the dresser she was in her closet and trying to reach and when she backed up she fell over bc of a shoe. She feels like she has worsened since then, something is worse. This was 4 months ago. She wonders if she has MS she has right arm pain and saw Dr. Aloha Arnold evaluated her and her right arm droops an dher posture is worse. She has neck  pain. Patient complains of symptoms per HPI as well as the following symptoms: none . Pertinent negatives and positives per HPI. All others negative  11/26/2021: reviewed images as below and agree   IMPRESSION: MRI scan of the brain without contrast showing mild changes of age-appropriate chronic small vessel disease and generalized cerebral atrophy.  No acute abnormalities are noted.  These appear slightly progressed compared with previous MRI dated 06/03/2019.    reviewed blood work:     Latest Ref Rng & Units 08/24/2021    4:46 PM 07/13/2021    3:56 PM 03/25/2020    4:53 PM  CMP  Glucose 70 - 99 mg/dL 284  80  132   BUN 8 - 23 mg/dL 11  16  16    Creatinine 0.44 - 1.00 mg/dL 4.40  1.02  7.25   Sodium 135 - 145 mmol/L 137  139  138   Potassium 3.5 - 5.1 mmol/L 4.1  4.0  4.2   Chloride 98 - 111 mmol/L 105  102  102   CO2 22 - 32 mmol/L 27  29  22    Calcium 8.9 - 10.3 mg/dL 9.6  36.6  44.0   Total Protein 6.0 - 8.5 g/dL   6.6   Total Bilirubin 0.0 - 1.2 mg/dL   0.3   Alkaline Phos 44 - 121 IU/L   81   AST 0 - 40 IU/L   21   ALT 0 - 32 IU/L   13  Latest Ref Rng & Units 08/24/2021    4:46 PM 06/12/2020    4:41 PM 03/25/2020    4:53 PM  CBC  WBC 4.0 - 10.5 K/uL 4.9  8.1  6.9   Hemoglobin 12.0 - 15.0 g/dL 16.1  09.6  04.5   Hematocrit 36.0 - 46.0 % 37.6  40.9  40.0   Platelets 150 - 400 K/uL 190        11/16/2021: Telephone note Dr. Tresia Fruit: Spoke with patient and provided emotional support. Her behavior was just fine from my short interaction with her in the office yesterday. Pt wanted message passed to Dr Tresia Fruit that she apologizes for talking too much about herself yesterday. Reassured patient we are here for her. Her questions were answered. She states she had a bad day with her depression today. States she has a lot to deal with at home and it has been that way for years. She denies having any plans to harm herself. She says she has had intermittent thoughts of suicide for years.  Has had depression since 83 years old. She has a psychiatrist and she plans to continue to follow with him. She was very appreciative for the call.   HPI: 11/15/2021: [name] is an 83 y.o. female here for a new issue possible TIA. She has already been seen in our practice in the past by Dr. Tilda Fogo who retired. She has a PMHx of anxiety, asthma, CKD(BUN and creat was recentky normal), urticaria(on zolair in the past) depression,HLD, HTN, memory loss, tremor, migraines, stress, prior TIA's per patient. She is on ASA 81mg , MMSE 30/30 12/2020 per [place] notes, I reviewed notes per [facility] team, she had a confusion episode with word difficulty and fogginess, unclear etiology, differential was TIA, and continued on aspirin .  Patient reported she had a memory lapse, felt confusion and difficulty with word finding after breakfast, possibly was after taking pain meds, confusion lasted for a few hours, patient had a CT of the head in 2019 that was unremarkable, 2016 MRI showed chronic small vessel microvascular disease, the episode of confusion and some difficulty with word finding lasted for about a few hours, she had no trouble swallowing, no weakness in the upper extremities or lower extremities, no severe headaches, she has a history of migraines but this was different, she has had UTIs in the past, urinalysis did show trace leukoesterase but no infection.Patient had  a mechanical fall 08/24/2021, reviewed ED notes, CT Head was unremarkable. MRi in 2016 showed normal for age microvascular white matter changes but no old ischemic events.  Patient does not remember having an episode of confusion. She states she never smoked and never drank. She reports she had a mechanical fall and she says her memory has been going downhill. She has been complaining of memory problems for several years, she states since she hit her head in July she saus her memory is worse. She states she saw Dr. Zelson 13 years ago and had a  normal neurocognitive examination. She states there is a lot of stress at home, her husband is 5 years older and almost threw his walker at her, husand has chronic back pain and see Dr. Larrie Po, her husband also had a stroke she thinks and when she got his plate she called the EMTs and took him to the hospital and he had a CVA. So there is a lot of stress. If husband falls, his grandson lives in patient's mother's house and he is an addict  and has been to prison. She has a son Ernestina Headland that is within walking distance to her and he worked at [employer] and he is recovered. The mysoline  helped her but she felt like she can be fine without the medication. She had a bad fall, she was sent home with blood still on her face. She has very bad depression, she can't take medications but she takes roxicodone  and had to take it last night for fibromyalgia.   08/2021: BUN 11 creatinine 0.88 normal  CT head 08/24/2021: CT HEAD FINDINGS   Brain: No evidence of acute infarction, hemorrhage, hydrocephalus, extra-axial collection or mass lesion/mass effect. There is stable mild diffuse atrophy. There is stable mild periventricular white matter hypodensity, likely chronic small vessel ischemic change. There is an old lacunar infarct in the left basal ganglia, unchanged.   Vascular: No hyperdense vessel or unexpected calcification.   Skull: Normal. Negative for fracture or focal lesion.   Other: Left frontal scalp soft tissue swelling.   MRI brain 01/16/2015: EXAM: MRI Brain with and without contrast   ORDERING CLINICIAN: Clem Currier NP CLINICAL HISTORY: 83 year old woman with headache and facial numbness COMPARISON FILMS: MRI of the brain 11/17/2008   TECHNIQUE:MRI of the brain with and without contrast was obtained utilizing 5 mm axial slices with T1, T2, T2 flair, SWI and diffusion weighted views.  T1 sagittal, T2 coronal and postcontrast views in the axial and coronal plane were obtained. CONTRAST: 16  ml Multihance  IMAGING SITE: [employer], [address] [place].   FINDINGS: On sagittal images, the spinal cord is imaged caudally to C4 and is normal in caliber.   The contents of the posterior fossa are of normal size and position.   The pituitary gland and optic chiasm appear normal.    Mild cortical atrophy is noted..   The ventricles are normal in size for age and without distortion.  There are no abnormal extra-axial collections of fluid.     The cerebellum and brainstem appear normal.   There are many expanded Virchow-Robin spaces adjacent to the basal ganglia bilaterally. In the hemispheres, there are scattered T2/FLAIR hyperintense foci located in the subcortical and deep white matter.  None of these appears acute.  The orbits appear normal.   The VIIth/VIIIth nerve complex appears normal.  The mastoid air cells appear normal.  The paranasal sinuses appear normal.  Flow voids are identified within the major intracerebral arteries.      Diffusion weighted images are normal.  Susceptibility weighted images are normal.   After the infusion of contrast material, a normal enhancement pattern is noted.   The study was compared to the MRI of the brain dated 11/17/2008. In the interim, several more T2/FLAIR hyperintense foci in the upper cortical deep white matter have developed.     IMPRESSION:  This MRI of the brain with and without contrast shows the following: 1. Mild cortical atrophy that is probably within normal limits for age. 2.  Scattered T2/FLAIR hyperintense foci in the subcortical deep white matter of both hemispheres consistent with chronic microvascular ischemic changes. The extent is mildly more than expected for age. When compared to the MRI dated 11/17/2008, some of these have developed during the interim, though none appears to be acute on the current study. 3.  There are no acute findings.  Patient complains of symptoms per HPI as well as the following symptoms:  depression . Pertinent negatives and positives per HPI. All others negative    History of  present illness:11/26/2020 Dr. Tilda Fogo' notes below  Ms. Burck is a 83 year old right-handed white female with a history of a tremor.  She has occasional headaches, she has diffuse neuromuscular discomfort and may have fibromyalgia.  She does report a mild memory problem.  She has significant issues with depression, she cares for her husband who is having a lot of medical issues.  Husband may be getting demented with confusion and urinary incontinence.  The patient came off of Diamox  for her tremor due to some stomach upset.  The tremor seems to be a bit worse in the morning.  She has noted some changes in her handwriting but she is still able to generate handwriting fairly well.  There is no resting component to the tremor.  Past Medical History:  Diagnosis Date   ADD (attention deficit disorder)    Anxiety    Arthritis    Asthma    Chronic kidney disease    CKD Stage 3   Chronic renal insufficiency, stage III (moderate) (HCC)    COVID-19    Depression    major depression   Dermatitis    Diverticulosis    Essential tremor 05/17/2012   Fibromyalgia    GERD (gastroesophageal reflux disease)    Hyperlipidemia    Hypertension    Memory loss 05/17/2012   Migraines    Occasional tremors    Pneumonia    TIA (transient ischemic attack)    History of 2 TIA's per pt   Tremor     Past Surgical History:  Procedure Laterality Date   ABDOMINAL HYSTERECTOMY     APPENDECTOMY     CATARACT EXTRACTION Bilateral    TONSILLECTOMY AND ADENOIDECTOMY     age 74   TOTAL HIP ARTHROPLASTY Bilateral 2009   Dr Hali Leventhal    Family History  Problem Relation Age of Onset   Liver cancer Mother    Pulmonary embolism Mother    Prostate cancer Father    Depression Brother        suicide   Lung cancer Maternal Aunt    ALS Paternal Aunt    Brain cancer Paternal Aunt    Breast cancer Paternal Aunt     Diabetes Paternal Aunt        x 2   Prostate cancer Paternal Uncle        x 2   Stroke Maternal Grandmother    Heart failure Maternal Grandfather    Emphysema Maternal Grandfather    Congestive Heart Failure Maternal Grandfather    Coronary artery disease Paternal Grandmother    Stroke Paternal Grandmother    Coronary artery disease Paternal Grandfather    Breast cancer Cousin    Colon cancer Neg Hx    Esophageal cancer Neg Hx    Rectal cancer Neg Hx    Stomach cancer Neg Hx     Social history:  reports that she has never smoked. She has never used smokeless tobacco. She reports that she does not drink alcohol and does not use drugs.    Allergies  Allergen Reactions   Crestor [Rosuvastatin Calcium]     myalgia   Zocor [Simvastatin]     myalgia   Enalapril     swelling   Lamictal [Lamotrigine]     rash   Nitrofuran Derivatives Nausea Only   Erythromycin Other (See Comments)    Stomach burning   Methylprednisolone      Major depression, weakness, nausea   Nystatin  Other (See Comments)    Blisters  on her lips   Other     Allergic to certain manufacturers for Hydrocodone  (Tris [employer] Mallencrot) causes her to feel suicidal   Vivelle [Estradiol]     rash   Zetia [Ezetimibe]     Muscle weakness   Ciprofloxacin Nausea Only   Ciprofloxacin Hcl     rash   Latex Rash   Shellfish Allergy Rash   Sulfa Antibiotics Nausea Only   Sulfasalazine Nausea Only    Medications:  Prior to Admission medications   Medication Sig Start Date End Date Taking? Authorizing Provider  aspirin  EC 81 MG tablet Take 81 mg by mouth daily.   Yes [provider]  bismuth subsalicylate (PEPTO BISMOL) 262 MG/15ML suspension Take 30 mLs by mouth every 6 (six) hours as needed.   Yes [provider]  Coenzyme Q10 (CO Q-10) 100 MG CAPS Take 100 mg by mouth daily.   Yes [provider]  EPINEPHrine  0.3 mg/0.3 mL IJ SOAJ injection Inject 0.3 mg into the muscle as needed. 05/15/20  Yes  [provider]  ergocalciferol (VITAMIN D2) 50000 UNITS capsule Take 50,000 Units by mouth once a week.   Yes [provider]  famotidine  (PEPCID ) 20 MG tablet TAKE 1 TABLET 3 TIMES DAILY (BEFORE BREAKFAST AND DINNER AND ANOTHER ONE BEFORE BEDTIME) 10/12/20  Yes Nandigam, Kavitha V, MD  FLUoxetine  (PROZAC ) 20 MG capsule Take 20 mg by mouth daily. 11/18/20  Yes [provider]  HYDROcodone -acetaminophen  (NORCO) 7.5-325 MG per tablet Take 1 tablet by mouth 2 (two) times daily. For pain   Yes [provider]  hyoscyamine  (LEVSIN SL) 0.125 MG SL tablet DISSOLVE 1 TABLET UNDER THE TONGUE EVERY 6 HOURS AS NEEDED 11/04/20  Yes Nandigam, Kavitha V, MD  ipratropium (ATROVENT) 0.06 % nasal spray Place into both nostrils as needed. 01/18/18  Yes [provider]  levothyroxine (SYNTHROID, LEVOTHROID) 50 MCG tablet Take 50 mcg by mouth daily before breakfast. Once daily 08/07/13  Yes [provider]  loratadine  (CLARITIN ) 10 MG tablet Take 1 tablet (10 mg total) by mouth in the morning and at bedtime. 07/08/20  Yes Rochester Chuck, MD  LORazepam  (ATIVAN ) 1 MG tablet Take 1 tablet by mouth at bedtime. 05/17/14  Yes [provider]  losartan  (COZAAR ) 100 MG tablet Take 100 mg by mouth daily.   Yes [provider]  miconazole  (MICOTIN) 2 % cream Apply 1 application topically 2 (two) times daily. 06/25/20  Yes Nandigam, Kavitha V, MD  Multiple Vitamins-Minerals (PRESERVISION AREDS) TABS Take 1 tablet by mouth daily.   Yes [provider]  SUMAtriptan  (IMITREX ) 50 MG tablet Take 50 mg by mouth every 2 (two) hours as needed. For migraine   Yes [provider]  triamcinolone  (KENALOG ) 0.025 % ointment Apply 1 application topically 2 (two) times daily. 06/12/20  Yes Rochester Chuck, MD  albuterol  (VENTOLIN  HFA) 108 (90 Base) MCG/ACT inhaler TAKE 2 PUFFS BY MOUTH EVERY 6 HOURS AS NEEDED FOR WHEEZE OR SHORTNESS OF BREATH Patient  not taking: Reported on 11/26/2020 05/16/19   Lind Repine, MD  cyanocobalamin 100 MCG tablet Take 100 mcg by mouth daily. Patient not taking: Reported on 11/26/2020    [provider]  DULoxetine  (CYMBALTA ) 30 MG capsule Take 30 mg by mouth daily. Patient not taking: Reported on 11/26/2020    [provider]  hydrOXYzine (ATARAX/VISTARIL) 25 MG tablet Take 25 mg by mouth as needed. Patient not taking: Reported on 11/26/2020 02/13/20   [provider]  primidone  (MYSOLINE ) 50 MG tablet Take 1 tablet (50 mg total) by mouth at bedtime. Patient not taking: Reported on 11/26/2020 07/08/20   Brian Campanile, MD     Blood pressure 126/76, pulse 74, height 5\' 4"  (1.626 m), weight 141 lb 3.2 oz (64 kg).   Exam: NAD, pleasant, lovely, tangential               Speech:    Speech is normal; fluent and spontaneous with normal comprehension.  Cognition:    The patient is oriented to person, place, and time;     recent and remote memory intact;     language fluent;    Cranial Nerves:    The pupils are equal, round, and reactive to light.Trigeminal sensation is intact and the muscles of mastication are normal. Bilateral ptosis(chronic)but the face is symmetric. The palate elevates in the midline. Hearing intact. Voice is normal. Shoulder shrug is normal. The tongue has normal motion without fasciculations.   Coordination:  No dysmetria  Motor Observation:    No asymmetry, no atrophy, and no involuntary movements noted. Tone:    Normal muscle tone.     Strength: Proximal weakness bilaterally but nothing focal      Sensation: intact to LT       06/22/2023    3:05 PM 11/16/2022    3:13 PM 11/15/2021    4:24 PM  MMSE - Mini Mental State Exam  Orientation to time 4 5 4   Orientation to Place 5 5 5   Registration 3 3 3   Attention/ Calculation 5 5 5   Recall 3 3 3   Language- name 2 objects 2 2 2   Language- repeat 1 1 1   Language- follow 3 step command 3 3 3    Language- read & follow direction 1 1 1   Write a sentence 1 1 1   Copy design 0 1 0  Total score 28 30 28       Assessment/Plan:83 y.o. female here for a new issue possible TIA. She has already been seen in our practice in the past by Dr. Tilda Fogo who retired. She has a PMHx of anxiety, asthma, CKD(BUN and creat was recently normal), urticaria(on zolair in the past per patient) depression,HLD, HTN, memory loss, tremor, migraines, stress, prior TIA's per patient. She is on ASA 81mg , MMSE 30/30 12/2020 per [facility] notes, patient also states she had a normal neurocognitive test 13 years ago with Dr. Zelson (I can't find it but she reports it was better than normal for her age). She doesn't remember the event she was referred here for, but she fell in July and CT was unremarkable but appears she had trauma to her head and she is still reporting decreasing memory, we can get an MRi of the brain to ensure she did not have a stroke during the episode she was referred here for and also due to her persistent memory complaints but she does report chronic pain and depression and anxiety. She declines anymore formal neurocognitive testing.    1.  Tremor, action type: likely benign essential tremor. States she stopped the primidone  Dr. Tilda Fogo gave her because she did not need it, however it did help.   2. Memory and episode of confusion and word difficulty also recent falls and imbalance : MRI of the brain was stable - unlikely dementia given stable MMSE, she says she had a normal neurocognitive test 13 years ago with Dr. Zelson. She declines another one. She may have had a slight  concussion after her fall, unclear, difficult to assess at this time with patient. MMSE today 30/30, last year 28/30, a 2 years ago ago 29/30, she is teary talking about her depression  3. Bilateral ptosis about a mm-62mm below the pupile chronic and not variable not concerning for myasthenia gravis; Had MG gravis testing in the past, no  changes, chronic, had testin prior to remote eyelid surgery but will check MRi brain.   4. Imbalance: patient with left arm pain. mrI c-spine without any cord etiology or myelopathy to cause imbalance. But She has left cervical foraminal stenosis C3/C4 and C5/C6 which may be causing radiating pain into her proximal left arm and shoulder. Asked if she wants to be evaluated for injections, She has some prox weakness in both arms but nothing focal. Pleae review thank you, she could be sent for [facility]. Sent to Dr. Hussain. Can refer to [facility] (please send copy of MRI cervical spine results)   Orders Placed This Encounter  Procedures   Ambulatory referral to Orthopedic Surgery     Aldona Amel, MD 06/22/2023 3:48 PM  [facility] [address] [place], [place] [address]  Phone [phone] Fax [phone]  I spent over 30 minutes of face-to-face and non-face-to-face time with patient on the  1. Cervical radiculopathy      diagnosis.  This included previsit chart review, lab review, study review, order entry, electronic health record documentation, patient education on the different diagnostic and therapeutic options, counseling and coordination of care, risks and benefits of management, compliance, or risk factor reduction

## 2023-06-22 NOTE — Patient Instructions (Signed)
 Send to Dr. Phares Brasher for injections or treatment for pinched nerve into the left arm   Cervical Radiculopathy  Cervical radiculopathy means that a nerve in the neck (a cervical nerve) is pinched or bruised. This can happen because of an injury to the cervical spine (vertebrae) in the neck, or as a normal part of getting older. This condition can cause pain or loss of feeling (numbness) that runs from your neck all the way down to your arm and fingers. Often, this condition gets better with rest. Treatment may be needed if the condition does not get better. What are the causes? A neck injury. A bulging disk in your spine. Sudden muscle tightening (muscle spasms). Tight muscles in your neck due to overuse. Arthritis. Breakdown in the bones and joints of the spine (spondylosis) due to getting older. Bone spurs that form near the nerves in the neck. What are the signs or symptoms? Pain. The pain may: Run from the neck to the arm and hand. Be very bad or irritating. Get worse when you move your neck. Loss of feeling or tingling in your arm or hand. Weakness in your arm or hand, in very bad cases. How is this treated? In many cases, treatment is not needed for this condition. With rest, the condition often gets better over time. If treatment is needed, options may include: Wearing a soft neck collar (cervical collar) for short periods of time. Doing exercises (physical therapy) to strengthen your neck muscles. Taking medicines. Having shots (injections) in your spine, in very bad cases. Having surgery. This may be needed if other treatments do not help. The type of surgery that is used will depend on the cause of your condition. Follow these instructions at home: If you have a soft neck collar: Wear it as told by your doctor. Take it off only as told by your doctor. Ask your doctor if you can take the collar off for cleaning and bathing. If you are allowed to take the collar off for cleaning or  bathing: Follow instructions from your doctor about how to take off the collar safely. Clean the collar by wiping it with mild soap and water and drying it completely. Take out any removable pads in the collar every 1-2 days. Wash them by hand with soap and water. Let them air-dry completely before you put them back in the collar. Check your skin under the collar for redness or sores. If you see any, tell your doctor. Managing pain     Take over-the-counter and prescription medicines only as told by your doctor. If told, put ice on the painful area. To do this: If you have a soft neck collar, take if off as told by your doctor. Put ice in a plastic bag. Place a towel between your skin and the bag. Leave the ice on for 20 minutes, 2-3 times a day. Take off the ice if your skin turns bright red. This is very important. If you cannot feel pain, heat, or cold, you have a greater risk of damage to the area. If using ice does not help, you can try using heat. Use the heat source that your doctor recommends, such as a moist heat pack or a heating pad. Place a towel between your skin and the heat source. Leave the heat on for 20-30 minutes. Take off the heat if your skin turns bright red. This is very important. If you cannot feel pain, heat, or cold, you have a greater risk of  getting burned. You may try a gentle neck and shoulder rub (massage). Activity Rest as needed. Return to your normal activities when your doctor says that it is safe. Do exercises as told by your doctor or physical therapist. You may have to avoid lifting. Ask your doctor how much you can safely lift. General instructions Use a flat pillow when you sleep. Do not drive while wearing a soft neck collar. If you do not have a soft neck collar, ask your doctor if it is safe to drive while your neck heals. Ask your doctor if you should avoid driving or using machines while you are taking your medicine. Do not smoke or use any  products that contain nicotine or tobacco. If you need help quitting, ask your doctor. Keep all follow-up visits. Contact a doctor if: Your condition does not get better with treatment. Get help right away if: Your pain gets worse and medicine does not help. You lose feeling or feel weak in your hand, arm, face, or leg. You have a high fever. Your neck is stiff. You cannot control when you poop or pee (have incontinence). You have trouble with walking, balance, or talking. Summary Cervical radiculopathy means that a nerve in the neck is pinched or bruised. A nerve can get pinched from a bulging disk, arthritis, an injury to the neck, or other causes. Symptoms include pain, tingling, or loss of feeling that goes from the neck to the arm or hand. Weakness in your arm or hand can happen in very bad cases. Treatment may include resting, wearing a soft neck collar, and doing exercises. You might need to take medicines for pain. In very bad cases, shots or surgery may be needed. This information is not intended to replace advice given to you by your health care provider. Make sure you discuss any questions you have with your health care provider. Document Revised: 08/06/2020 Document Reviewed: 08/06/2020 Elsevier Patient Education  2024 [employer].

## 2023-06-29 DIAGNOSIS — M25511 Pain in right shoulder: Secondary | ICD-10-CM | POA: Diagnosis not present

## 2023-06-29 DIAGNOSIS — F39 Unspecified mood [affective] disorder: Secondary | ICD-10-CM | POA: Diagnosis not present

## 2023-06-29 DIAGNOSIS — F331 Major depressive disorder, recurrent, moderate: Secondary | ICD-10-CM | POA: Diagnosis not present

## 2023-06-29 DIAGNOSIS — K589 Irritable bowel syndrome without diarrhea: Secondary | ICD-10-CM | POA: Diagnosis not present

## 2023-06-29 DIAGNOSIS — K5900 Constipation, unspecified: Secondary | ICD-10-CM | POA: Diagnosis not present

## 2023-07-07 ENCOUNTER — Encounter: Payer: Self-pay | Admitting: Gastroenterology

## 2023-07-07 ENCOUNTER — Ambulatory Visit: Payer: PRIVATE HEALTH INSURANCE | Admitting: Gastroenterology

## 2023-07-07 VITALS — BP 132/90 | HR 88 | Ht 64.0 in | Wt 139.5 lb

## 2023-07-07 DIAGNOSIS — Z860101 Personal history of adenomatous and serrated colon polyps: Secondary | ICD-10-CM

## 2023-07-07 DIAGNOSIS — K5900 Constipation, unspecified: Secondary | ICD-10-CM | POA: Diagnosis not present

## 2023-07-07 DIAGNOSIS — K648 Other hemorrhoids: Secondary | ICD-10-CM | POA: Diagnosis not present

## 2023-07-07 DIAGNOSIS — K219 Gastro-esophageal reflux disease without esophagitis: Secondary | ICD-10-CM

## 2023-07-07 MED ORDER — LINACLOTIDE 290 MCG PO CAPS
290.0000 ug | ORAL_CAPSULE | Freq: Every day | ORAL | 2 refills | Status: DC
Start: 2023-07-07 — End: 2023-10-18

## 2023-07-07 NOTE — Progress Notes (Signed)
 [name] [id number] July 31, 1940   Chief Complaint: Constipation  Referring Provider: Jearldine Mina, MD Primary GI MD: Dr. Leonia Raman  HPI: [name] is a 83 y.o. female with past medical history of fibromyalgia, depression, pancreatic cyst, GERD on famotidine  20 mg, adenomatous colon polyps, IBS with associated constipation and abdominal pain who presents today for a complaint of constipation.    Patient last seen in office 06/14/2021 by Everett Hitt, NP for follow-up of constipation and heartburn.  Patient had stopped taking Linzess  as it resulted in diarrhea.  Plan was to try IBgard and prune lax.  Patient was due for repeat colonoscopy 11/2020 per Dr. Leonia Raman, but patient declined at prior office visit.  All appointment since 06/14/2021 have been canceled/no-show by patient.  Patient previously has not taken MiraLAX because she thought it would harm her kidneys.   Patient states that she has been on Linzess  145 mcg prescribed by her PCP.  This was giving her some improvement of her constipation, so a few days ago it was increased to 290 mcg.  She has not had any diarrhea since starting Linzess .  Previously she had been using Dulcolax and Colace, her PCP was concerned about her frequent use of stimulant laxative.  She had tried MiraLAX in the past but states it stopped working.  [place], buys bottled water.  Drinks 4 bottles of water daily.  Back in November, patient had been very constipated, manually disimpacted herself at home and caused trauma to her anus and rectum which resulted in some bleeding.  She has not had any repeat episodes of bleeding since then. Has been advised to go to the ER in future if she feels impacted.  She has noticed a lot of mucus in her stool, better when she moves her bowels more frequently.  Has pictures of mucus on toilet paper.  She rarely has abdominal pain, but states her bowels  feel sluggish.  Occasionally has some nausea but denies vomiting.  Reports frequent straining and less she takes Colace, which she continues to do.  She is unable to tell me how often she is having a bowel movement, states she only has one f she is taking medication, has been more frequent on Linzess .  She requests rectal exam to assess for hemorrhoids today.  Previous GI Procedures/Imaging   MRI/MRCP abdomen 01/10/2023 1. Unchanged lobulated, multiseptated fluid signal cystic lesion in the inferior portion of the pancreatic tail measuring 2.4 x 1.2 cm. As previously reported, this has been present on multiple prior examinations dating back to CT abdomen pelvis dated 05/22/2013 and is not significantly changed in that interval when measured with similar technique. This is remains most consistent with a side branch IPMN, and given well established imaging stability of greater than 9 years as well as patient age greater than 80 years, no further follow-up or characterization is required.   2.  No acute findings in the abdomen.  Colonoscopy 11/26/2015 - One 2 mm polyp in the ascending colon, removed with a cold biopsy forceps. Resected and retrieved.  - One 5 mm polyp in the descending colon, removed with a cold snare. Resected and retrieved.  - Non- bleeding internal hemorrhoids.  - Diverticulosis in the sigmoid colon. - 5-year recall recommended Path: Surgical [P], descending and ascending, polyp (2) - TUBULAR ADENOMA(S). - HIGH GRADE DYSPLASIA IS NOT IDENTIFIED.  EGD 11/26/2015 - Normal esophagus.  - Normal stomach.  -  Normal examined duodenum.  - No specimens collected.   Past Medical History:  Diagnosis Date   ADD (attention deficit disorder)    Anxiety    Arthritis    Asthma    Chronic kidney disease    CKD Stage 3   Chronic renal insufficiency, stage III (moderate) (HCC)    COVID-19    Depression    major depression   Dermatitis    Diverticulosis     Essential tremor 05/17/2012   Fibromyalgia    GERD (gastroesophageal reflux disease)    Hyperlipidemia    Hypertension    Memory loss 05/17/2012   Migraines    Occasional tremors    Pneumonia    TIA (transient ischemic attack)    History of 2 TIA's per pt   Tremor     Past Surgical History:  Procedure Laterality Date   ABDOMINAL HYSTERECTOMY     APPENDECTOMY     CATARACT EXTRACTION Bilateral    TONSILLECTOMY AND ADENOIDECTOMY     age 59   TOTAL HIP ARTHROPLASTY Bilateral 2009   Dr Hali Leventhal    Current Outpatient Medications  Medication Sig Dispense Refill   albuterol  (VENTOLIN  HFA) 108 (90 Base) MCG/ACT inhaler Inhale 1 puff into the lungs as needed.     aspirin  EC 81 MG tablet Take 81 mg by mouth daily.     benzonatate (TESSALON) 200 MG capsule Take 200 mg by mouth 3 (three) times daily as needed.     bismuth subsalicylate (PEPTO BISMOL) 262 MG/15ML suspension Take 30 mLs by mouth every 6 (six) hours as needed.     busPIRone (BUSPAR) 5 MG tablet Take 5 mg by mouth 2 (two) times daily.     Coenzyme Q10 (CO Q-10) 100 MG CAPS Take 100 mg by mouth daily.     dicyclomine  (BENTYL ) 10 MG capsule Take 1 capsule (10 mg total) by mouth every 8 (eight) hours as needed for spasms. 30 capsule 0   DULoxetine  (CYMBALTA ) 30 MG capsule Take 30 mg by mouth daily.     ergocalciferol (VITAMIN D2) 50000 UNITS capsule Take 50,000 Units by mouth once a week.     famotidine  (PEPCID ) 20 MG tablet TAKE 1 TABLET BY MOUTH TWICE A DAY 180 tablet 1   HYDROcodone -acetaminophen  (NORCO) 7.5-325 MG tablet Take 1 tablet by mouth 2 (two) times daily as needed for moderate pain (pain score 4-6).     hyoscyamine  (LEVSIN SL) 0.125 MG SL tablet Take 1 tablet (0.125 mg total) by mouth every 12 (twelve) hours as needed. 30 tablet 0   ipratropium (ATROVENT) 0.06 % nasal spray Place into both nostrils as needed.     levothyroxine (SYNTHROID, LEVOTHROID) 50 MCG tablet Take 50 mcg by mouth daily before breakfast. Once  daily     LORazepam  (ATIVAN ) 1 MG tablet Take 1 tablet by mouth at bedtime.     losartan  (COZAAR ) 100 MG tablet Take 100 mg by mouth daily.     ondansetron  (ZOFRAN ) 4 MG tablet Take 4 mg by mouth as needed for nausea or vomiting.     SUMAtriptan  (IMITREX ) 50 MG tablet Take 50 mg by mouth as needed. For migraine     triamcinolone  (KENALOG ) 0.025 % ointment Apply 1 application topically 2 (two) times daily. 160 g 6   Current Facility-Administered Medications  Medication Dose Route Frequency Provider Last Rate Last Admin   omalizumab  (XOLAIR ) injection 300 mg  300 mg Subcutaneous Q28 days Rochester Chuck, MD   300 mg  at 10/09/20 1501    Allergies as of 07/07/2023 - Review Complete 06/22/2023  Allergen Reaction Noted   Crestor [rosuvastatin calcium]  11/17/2015   Zocor [simvastatin]  11/17/2015   Enalapril  11/17/2015   Lamictal [lamotrigine]  11/17/2015   Nitrofuran derivatives Nausea Only 12/29/2014   Erythromycin Other (See Comments)    Methylprednisolone   12/20/2018   Nystatin  Other (See Comments) 11/17/2017   Other  11/16/2022   Vivelle [estradiol]  11/17/2015   Zetia [ezetimibe]  07/24/2017   Ciprofloxacin Nausea Only 12/12/2011   Ciprofloxacin hcl  11/17/2015   Latex Rash 03/03/2012   Shellfish allergy Rash 03/03/2012   Sulfa antibiotics Nausea Only 12/12/2011   Sulfasalazine Nausea Only 12/12/2011    Family History  Problem Relation Age of Onset   Liver cancer Mother    Pulmonary embolism Mother    Prostate cancer Father    Depression Brother        suicide   Lung cancer Maternal Aunt    ALS Paternal Aunt    Brain cancer Paternal Aunt    Breast cancer Paternal Aunt    Diabetes Paternal Aunt        x 2   Prostate cancer Paternal Uncle        x 2   Stroke Maternal Grandmother    Heart failure Maternal Grandfather    Emphysema Maternal Grandfather    Congestive Heart Failure Maternal Grandfather    Coronary artery disease Paternal Grandmother    Stroke  Paternal Grandmother    Coronary artery disease Paternal Grandfather    Breast cancer Cousin    Colon cancer Neg Hx    Esophageal cancer Neg Hx    Rectal cancer Neg Hx    Stomach cancer Neg Hx     Social History   Tobacco Use   Smoking status: Never   Smokeless tobacco: Never  Vaping Use   Vaping status: Never Used  Substance Use Topics   Alcohol use: No    Alcohol/week: 0.0 standard drinks of alcohol   Drug use: No     Review of Systems:    Constitutional: No weight loss, fever, chills, weakness or fatigue Eyes: No change in vision Ears, Nose, Throat:  No change in hearing or congestion Skin: No rash or itching Cardiovascular: No chest pain, chest pressure or palpitations   Respiratory: No SOB or cough Gastrointestinal: See HPI and otherwise negative Genitourinary: No dysuria or change in urinary frequency Neurological: No headache, dizziness or syncope Musculoskeletal: No new muscle or joint pain Hematologic: No bruising    Physical Exam:  Vital signs: BP (!) 132/90   Pulse 88   Ht 5\' 4"  (1.626 m)   Wt 139 lb 8 oz (63.3 kg)   SpO2 98%   BMI 23.95 kg/m    Constitutional: NAD, Well developed, Well nourished, alert and cooperative Head:  Normocephalic and atraumatic.  Eyes: No scleral icterus. Conjunctiva pink. Mouth: No oral lesions. Respiratory: Respirations even and unlabored. Lungs clear to auscultation bilaterally.  No wheezes, crackles, or rhonchi.  Cardiovascular:  Regular rate and rhythm. No murmurs. No peripheral edema. Gastrointestinal:  Soft, nondistended, nontender. No rebound or guarding. Normal bowel sounds. No appreciable masses or hepatomegaly. Rectal: No external hemorrhoids or anal fissures.  Normal rectal tone.  Anoscopy performed, non-bleeding internal hemorrhoids visualized.  Neurologic:  Alert and oriented x4;  grossly normal neurologically.  Skin:   Dry and intact without significant lesions or rashes. Psychiatric: Oriented to person,  place and time. Demonstrates  good judgement and reason without abnormal affect or behaviors.   RELEVANT LABS AND IMAGING: CBC    Component Value Date/Time   WBC 4.9 08/24/2021 1646   RBC 3.92 08/24/2021 1646   HGB 12.7 08/24/2021 1646   HGB 13.2 06/12/2020 1641   HCT 37.6 08/24/2021 1646   HCT 40.9 06/12/2020 1641   PLT 190 08/24/2021 1646   MCV 95.9 08/24/2021 1646   MCV 99 (H) 06/12/2020 1641   MCH 32.4 08/24/2021 1646   MCHC 33.8 08/24/2021 1646   RDW 12.2 08/24/2021 1646   RDW 11.3 (L) 06/12/2020 1641   LYMPHSABS 0.7 08/24/2021 1646   LYMPHSABS 0.8 06/12/2020 1641   MONOABS 0.3 08/24/2021 1646   EOSABS 0.2 08/24/2021 1646   EOSABS 0.2 06/12/2020 1641   BASOSABS 0.0 08/24/2021 1646   BASOSABS 0.1 06/12/2020 1641    CMP     Component Value Date/Time   NA 137 08/24/2021 1646   NA 138 03/25/2020 1653   K 4.1 08/24/2021 1646   CL 105 08/24/2021 1646   CO2 27 08/24/2021 1646   GLUCOSE 103 (H) 08/24/2021 1646   BUN 11 08/24/2021 1646   BUN 16 03/25/2020 1653   CREATININE 0.88 08/24/2021 1646   CALCIUM 9.6 08/24/2021 1646   PROT 6.6 03/25/2020 1653   ALBUMIN 4.4 03/25/2020 1653   AST 21 03/25/2020 1653   ALT 13 03/25/2020 1653   ALKPHOS 81 03/25/2020 1653   BILITOT 0.3 03/25/2020 1653   GFRNONAA >60 08/24/2021 1646   GFRAA 71 03/25/2020 1653     Assessment/Plan:   Constipation Internal hemorrhoids Patient continues to have trouble with constipation, had to manually disimpact herself in November which caused trauma to the area and bleeding, resolved.  Internal hemorrhoids seen on exam today.  She has been doing better with Linzess  this time around, recently increased to 290 mcg and would like us  to start prescribing this for her.  -Start Carmol suppositories, 1 suppository at night before bedtime for 1 to 2 weeks - Start Linzess  once daily  History of adenomatous colon polyps Previously advised to have repeat colonoscopy in 2022, patient had declined  at that time, has not followed up with us  in a couple of years.  Recommend follow-up with Dr. Nandigam to discuss whether repeat colonoscopy indicated.  GERD Symptoms controlled on medication - Continue famotidine  20 mg 1-2 times daily    Valiant Gaul, PA-C [facility] 07/07/2023, 1:32 PM  Patient Care Team: Jearldine Mina, MD as PCP - General (Internal Medicine) Daphine Eagle, MD as Consulting Physician (Psychiatry)

## 2023-07-07 NOTE — Patient Instructions (Signed)
 We have sent the following medications to your pharmacy for you to pick up at your convenience: Linzess   Please purchase the following medications over the counter and take as directed: Calmol 4 suppositories  _______________________________________________________  If your blood pressure at your visit was 140/90 or greater, please contact your primary care physician to follow up on this.  _______________________________________________________  If you are age 81 or older, your body mass index should be between 23-30. Your Body mass index is 23.95 kg/m. If this is out of the aforementioned range listed, please consider follow up with your Primary Care Provider.  If you are age 52 or younger, your body mass index should be between 19-25. Your Body mass index is 23.95 kg/m. If this is out of the aformentioned range listed, please consider follow up with your Primary Care Provider.   ________________________________________________________  The [facility] providers would like to encourage you to use MYCHART to communicate with providers for non-urgent requests or questions.  Due to long hold times on the telephone, sending your provider a message by [facility] may be a faster and more efficient way to get a response.  Please allow 48 business hours for a response.  Please remember that this is for non-urgent requests.  _______________________________________________________  Thank you for entrusting me with your care and choosing [facility].  Valiant Gaul PA-C

## 2023-07-13 DIAGNOSIS — N1831 Chronic kidney disease, stage 3a: Secondary | ICD-10-CM | POA: Diagnosis not present

## 2023-07-13 DIAGNOSIS — I700 Atherosclerosis of aorta: Secondary | ICD-10-CM | POA: Diagnosis not present

## 2023-07-13 DIAGNOSIS — I10 Essential (primary) hypertension: Secondary | ICD-10-CM | POA: Diagnosis not present

## 2023-07-14 ENCOUNTER — Ambulatory Visit: Payer: PRIVATE HEALTH INSURANCE | Admitting: Physician Assistant

## 2023-07-15 DIAGNOSIS — I10 Essential (primary) hypertension: Secondary | ICD-10-CM | POA: Diagnosis not present

## 2023-07-15 DIAGNOSIS — N1831 Chronic kidney disease, stage 3a: Secondary | ICD-10-CM | POA: Diagnosis not present

## 2023-07-15 DIAGNOSIS — I700 Atherosclerosis of aorta: Secondary | ICD-10-CM | POA: Diagnosis not present

## 2023-07-15 DIAGNOSIS — E039 Hypothyroidism, unspecified: Secondary | ICD-10-CM | POA: Diagnosis not present

## 2023-07-15 DIAGNOSIS — E7800 Pure hypercholesterolemia, unspecified: Secondary | ICD-10-CM | POA: Diagnosis not present

## 2023-07-22 IMAGING — MR MR LUMBAR SPINE W/O CM
4 of 5 series · 26 of 48 positions shown · non-contrast
Comparison: Lumbar spine x-rays dated September 24, 2020. MRI lumbar
spine dated December 07, 2018.

CLINICAL DATA: Low back and right leg pain for the past 2-3 months.
No injury or prior surgery.

EXAM:
MRI LUMBAR SPINE WITHOUT CONTRAST
TECHNIQUE: Multiplanar, multisequence MR imaging of the lumbar spine was
performed. No intravenous contrast was administered.

[Series 2: T2 · sagittal · 4.0mm · 0.53mm/px · 6 of 13 slices shown (1 of 2)]
[im 1/13]
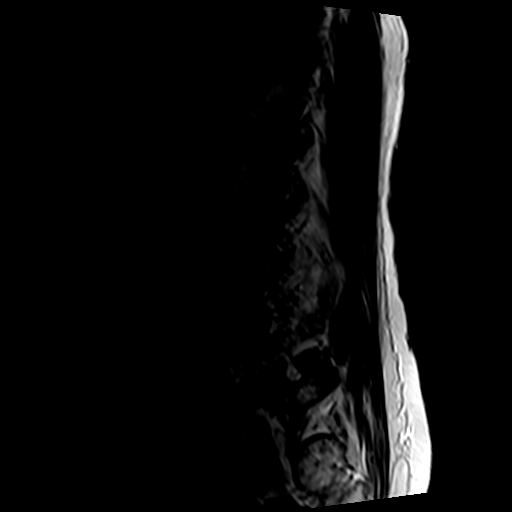
[im 3/13]
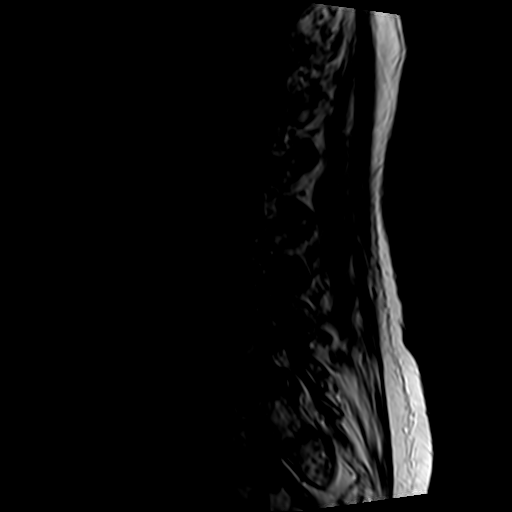
[im 5/13]
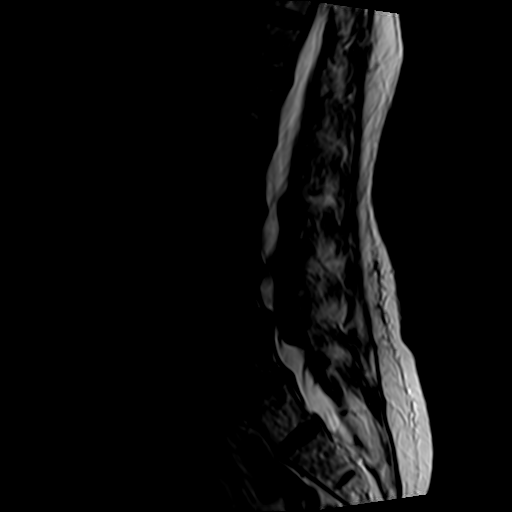
[im 8/13]
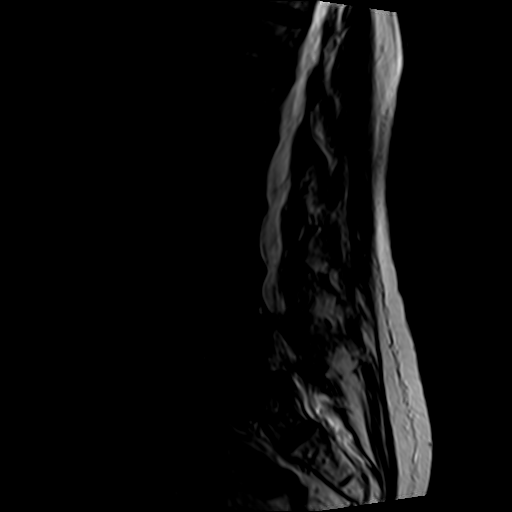
[im 10/13]
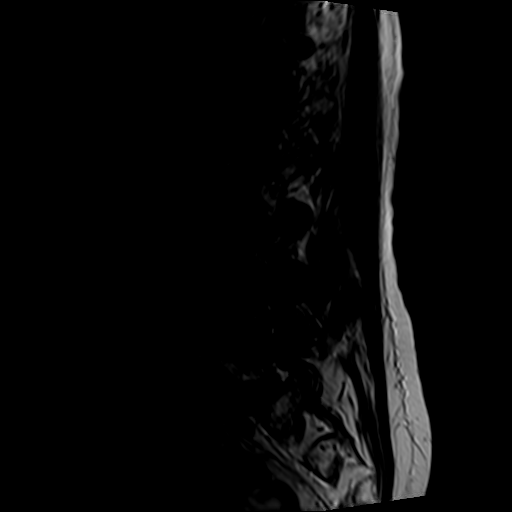
[im 13/13]
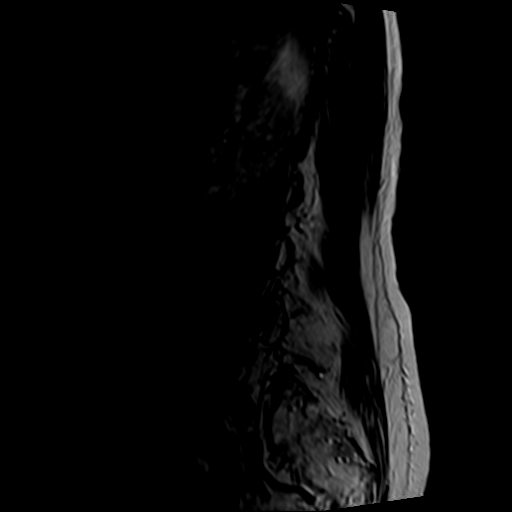

[Series 4: T1 · sagittal · 4.0mm · 0.53mm/px · 5 of 13 slices shown (1 of 2)]
[im 1/13]
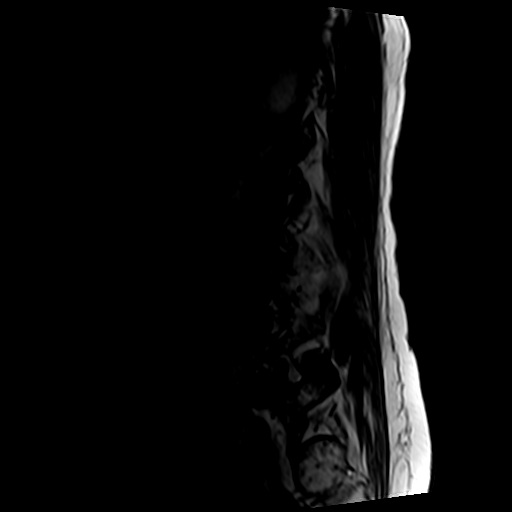
[im 4/13]
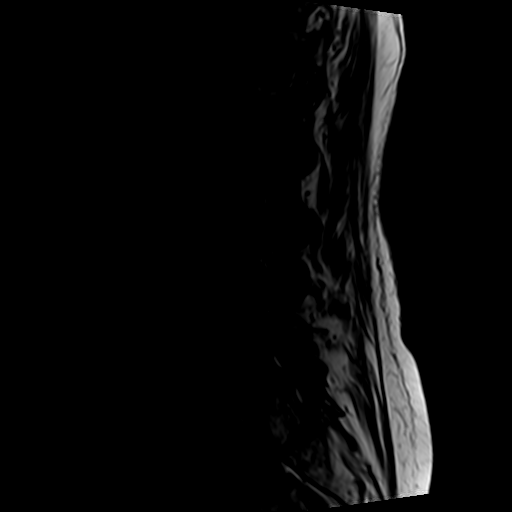
[im 7/13]
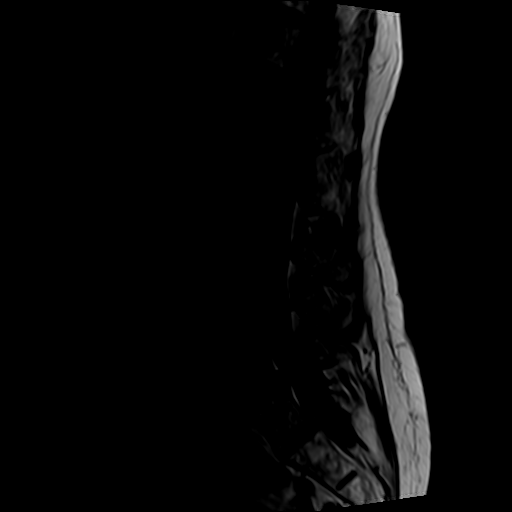
[im 10/13]
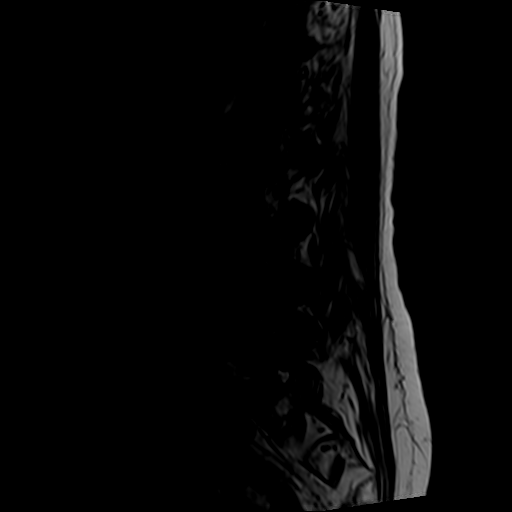
[im 13/13]
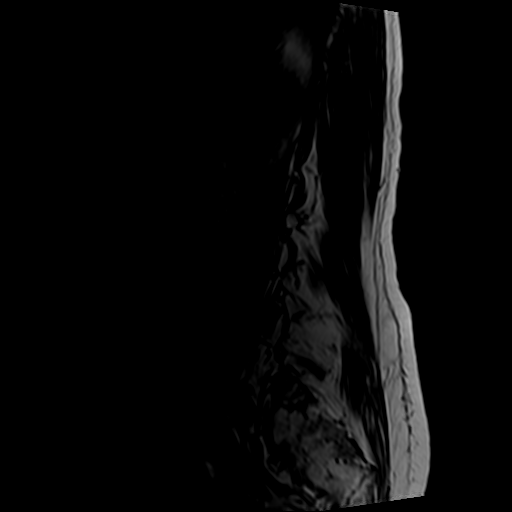

[Series 5: T2 · axial · 4.0mm · 0.70mm/px · z∈[-41,+151]mm · 10 of 39 slices shown (2 of 2)]
[im 3/39]
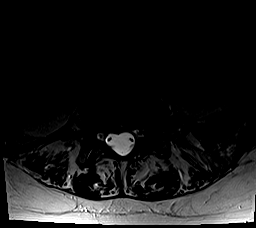
[im 6/39]
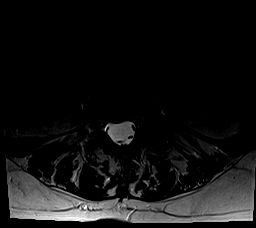
[im 8/39]
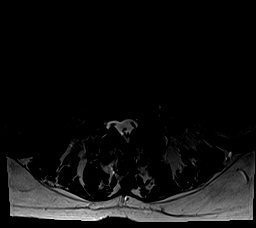
[im 13/39]
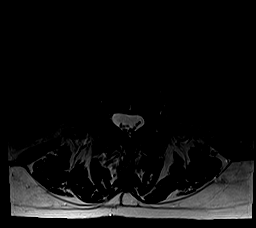
[im 18/39]
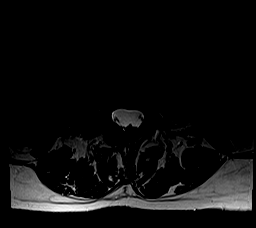
[im 21/39]
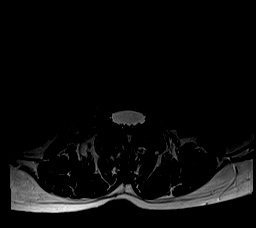
[im 23/39]
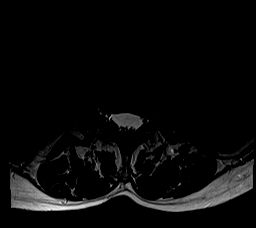
[im 28/39]
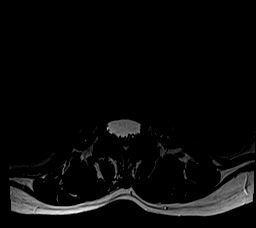
[im 33/39]
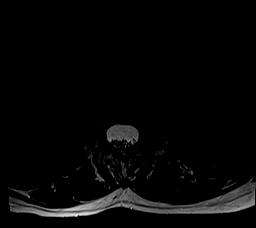
[im 39/39]
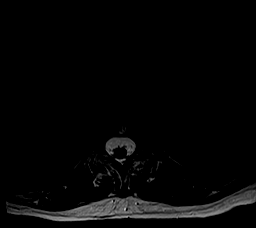

[Series 6: T1 · axial · 4.0mm · 0.35mm/px · z∈[-41,+120]mm · 5 of 39 slices shown (2 of 2)]
[im 3/39]
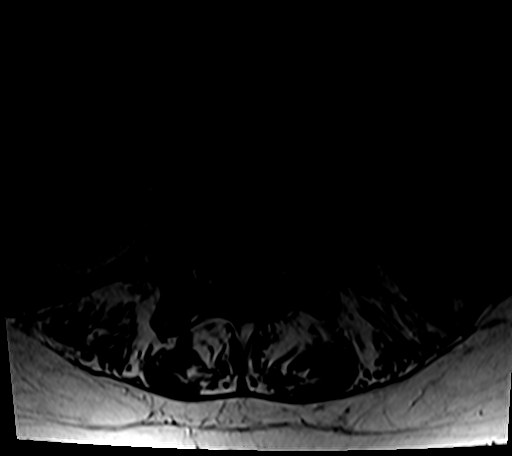
[im 6/39]
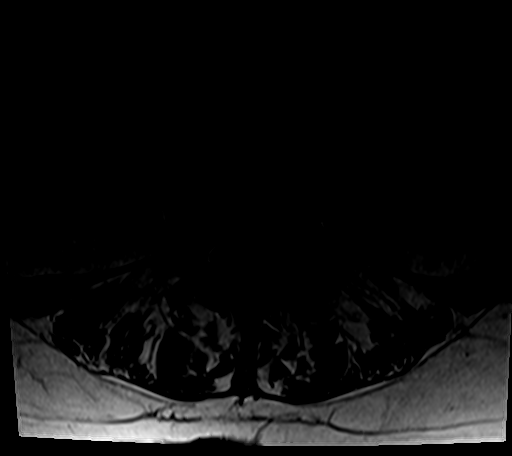
[im 8/39]
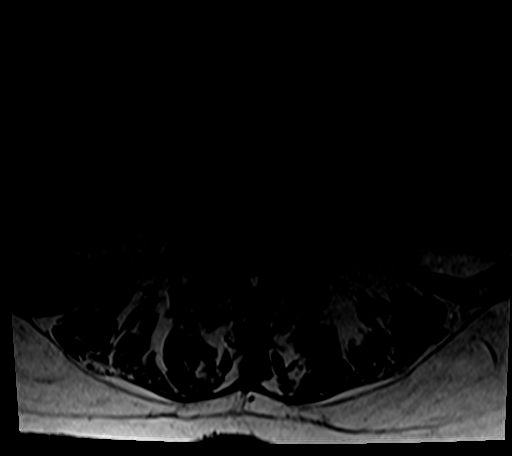
[im 21/39]
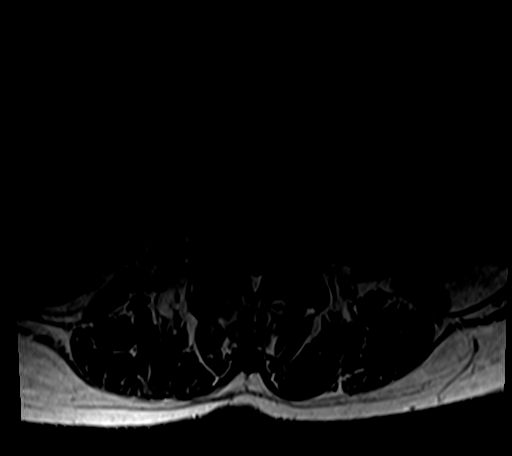
[im 33/39]
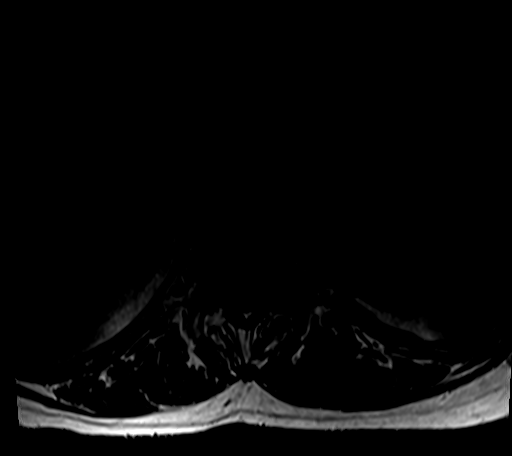

[26 of 48 positions shown; findings below may reference images not displayed]

FINDINGS: Segmentation:  Unchanged partial lumbarization of S1.

Alignment:  Unchanged 3 mm anterolisthesis at L4-L5 and L5-S1.

Vertebrae: Unchanged diffuse marrow heterogeneity without focal
lesion. No fracture or evidence of discitis.

Conus medullaris and cauda equina: Conus extends to the L1 level.
Conus and cauda equina appear normal.

Paraspinal and other soft tissues: Negative.

Disc levels:

T12-L1:  Negative.

L1-L2:  Negative.

L2-L3:  Unchanged minimal disc bulging.  No stenosis.

L3-L4: Unchanged small circumferential disc osteophyte complex and
mild bilateral facet arthropathy. Unchanged mild bilateral lateral
recess and neuroforaminal stenosis. No spinal canal stenosis.

L4-L5: Unchanged mild disc bulging. New moderate right paracentral
and subarticular disc protrusion. Unchanged small right foraminal
protrusion. Unchanged moderate bilateral facet arthropathy. New mild
to moderate spinal canal stenosis. Progressive now moderate
bilateral lateral recess stenosis. Unchanged mild right
neuroforaminal stenosis. No left neuroforaminal stenosis.

L5-S1: Unchanged mild disc bulging and moderate bilateral facet
arthropathy. Unchanged mild left neuroforaminal stenosis. No spinal
canal or right neuroforaminal stenosis.
IMPRESSION: 1. Multilevel lumbar spondylosis as described above. New moderate
right paracentral and subarticular disc protrusion at L4-L5 with
progressive now moderate bilateral lateral recess stenosis which
could affect either descending L5 nerve root.

## 2023-07-31 DIAGNOSIS — F331 Major depressive disorder, recurrent, moderate: Secondary | ICD-10-CM | POA: Diagnosis not present

## 2023-07-31 DIAGNOSIS — E039 Hypothyroidism, unspecified: Secondary | ICD-10-CM | POA: Diagnosis not present

## 2023-07-31 DIAGNOSIS — N1831 Chronic kidney disease, stage 3a: Secondary | ICD-10-CM | POA: Diagnosis not present

## 2023-07-31 DIAGNOSIS — Z23 Encounter for immunization: Secondary | ICD-10-CM | POA: Diagnosis not present

## 2023-07-31 DIAGNOSIS — I10 Essential (primary) hypertension: Secondary | ICD-10-CM | POA: Diagnosis not present

## 2023-07-31 DIAGNOSIS — G894 Chronic pain syndrome: Secondary | ICD-10-CM | POA: Diagnosis not present

## 2023-07-31 DIAGNOSIS — Z0000 Encounter for general adult medical examination without abnormal findings: Secondary | ICD-10-CM | POA: Diagnosis not present

## 2023-07-31 DIAGNOSIS — G4700 Insomnia, unspecified: Secondary | ICD-10-CM | POA: Diagnosis not present

## 2023-07-31 DIAGNOSIS — R269 Unspecified abnormalities of gait and mobility: Secondary | ICD-10-CM | POA: Diagnosis not present

## 2023-07-31 DIAGNOSIS — M797 Fibromyalgia: Secondary | ICD-10-CM | POA: Diagnosis not present

## 2023-07-31 DIAGNOSIS — Z1331 Encounter for screening for depression: Secondary | ICD-10-CM | POA: Diagnosis not present

## 2023-07-31 DIAGNOSIS — H539 Unspecified visual disturbance: Secondary | ICD-10-CM | POA: Diagnosis not present

## 2023-07-31 DIAGNOSIS — R0609 Other forms of dyspnea: Secondary | ICD-10-CM | POA: Diagnosis not present

## 2023-08-01 ENCOUNTER — Other Ambulatory Visit: Payer: Self-pay | Admitting: Nurse Practitioner

## 2023-08-09 ENCOUNTER — Telehealth: Payer: Self-pay | Admitting: Gastroenterology

## 2023-08-09 NOTE — Telephone Encounter (Signed)
 Spoke with the patient. She heard gurgling in her upper abdomen last night when she was lying on her left side. She was concerned it could be a sign of a blockage. She takes daily Linzess  290 mcg. She supplements with Senna and a stool softener PRN. She has not had a bowel movement today. She denies any nausea or abdominal pain. She will take Senna this evening if she has not had a bowel movement.  Explained that gurgling is not concerning in and of itself. If she develops nausea, vomiting or abdominal pain we would certainly want to hear from her. Patient states she is reassured by this and will let us  know if she develops any new symptoms.

## 2023-08-09 NOTE — Telephone Encounter (Signed)
 Inbound call from patient stating she had gurgling in her stomach last night. Requesting to speak with nurse further. Please advise, thank you.

## 2023-08-12 DIAGNOSIS — N1831 Chronic kidney disease, stage 3a: Secondary | ICD-10-CM | POA: Diagnosis not present

## 2023-08-12 DIAGNOSIS — I700 Atherosclerosis of aorta: Secondary | ICD-10-CM | POA: Diagnosis not present

## 2023-08-12 DIAGNOSIS — I10 Essential (primary) hypertension: Secondary | ICD-10-CM | POA: Diagnosis not present

## 2023-08-14 DIAGNOSIS — E7800 Pure hypercholesterolemia, unspecified: Secondary | ICD-10-CM | POA: Diagnosis not present

## 2023-08-14 DIAGNOSIS — I10 Essential (primary) hypertension: Secondary | ICD-10-CM | POA: Diagnosis not present

## 2023-08-14 DIAGNOSIS — N1831 Chronic kidney disease, stage 3a: Secondary | ICD-10-CM | POA: Diagnosis not present

## 2023-08-14 DIAGNOSIS — I700 Atherosclerosis of aorta: Secondary | ICD-10-CM | POA: Diagnosis not present

## 2023-08-14 DIAGNOSIS — E039 Hypothyroidism, unspecified: Secondary | ICD-10-CM | POA: Diagnosis not present

## 2023-08-28 ENCOUNTER — Telehealth: Payer: Self-pay | Admitting: Neurology

## 2023-08-28 DIAGNOSIS — M316 Other giant cell arteritis: Secondary | ICD-10-CM

## 2023-08-28 DIAGNOSIS — R413 Other amnesia: Secondary | ICD-10-CM

## 2023-08-28 DIAGNOSIS — M353 Polymyalgia rheumatica: Secondary | ICD-10-CM

## 2023-08-28 NOTE — Telephone Encounter (Signed)
 Spoke to patient pt states not feeling well Pt states feeling sick daily . 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 Made pt aware Dr. Ines is out of office this week and will return Monday . Will forward to Dr Ines for recommendations Per patient did call PCP and PCP states for her to call eye doctor that she may have detached retina . Pt has not called eye MD yet . Pt states will call tomorrow. Asked patient to she make PCP aware how sick she is feeling daily Pt states no . Did inform patient to call PCP back tomorrow to make aware of how patient is feeling on a daily basis . Pt states will call PCP back tomorrow to get an sooner appointment  Will forward to Dr Ines . Pt expressed understanding and thanked me for calling .

## 2023-08-28 NOTE — Telephone Encounter (Signed)
 Pt called stating that she is not happy that she did not get a check up like Dr. Maurice or Dr. Jenel used to in her last appt. She states that nothing was done for her pain or checked for ALS and no orders were put in for her to have a CT Scan or anything because of her having family passing of a brain tumor and ALS. She states that she on one occasion went blind for like 5-10 min. And nothing was addressed about this. She states that she did get a call from [facility] but they are not checking for what is actually causing her discomfort. Please advise.

## 2023-08-30 NOTE — Addendum Note (Signed)
 Addended by: SHONA SAVANT A on: 08/30/2023 12:21 PM   Modules accepted: Orders

## 2023-08-30 NOTE — Telephone Encounter (Signed)
 She had an MRI of her brain and cervical spine. We sent her to orthopaedics for further evaluation of pain. She declined formal memory testing. I did not appreciate anything to support an ALS diagnosis or workup. I suggest her primary care refer to another neurologist for another opinion if she feels her care was not appropriate. In the meantime we can offer an esr and crp for temporal arteritis or polymyalgia rheumatica however symptoms not supportive of either but recommend checking for completeness and she can come to the office for lab. Please let patient know my recommendations.  FYI Dr. Husain .

## 2023-08-30 NOTE — Telephone Encounter (Signed)
 Spoke to patient gave Dr Sharion recommendations Pt agreeable to proceed with labowork and formal memory testing Pt states has an appointment with orthopedic surgery consults 09/29/2023 Pt hasn't called eye MD yet for intermittent  blindness . Pt states will call later this week .

## 2023-08-31 ENCOUNTER — Telehealth: Payer: Self-pay | Admitting: Neurology

## 2023-08-31 NOTE — Addendum Note (Signed)
 Addended by: Lareina Espino B on: 08/31/2023 09:53 AM   Modules accepted: Orders

## 2023-08-31 NOTE — Telephone Encounter (Signed)
 Referral to Neuropsychology faxed to [facility]  [facility] Phone:[phone] Fax:[phone]

## 2023-09-06 DIAGNOSIS — I10 Essential (primary) hypertension: Secondary | ICD-10-CM | POA: Diagnosis not present

## 2023-09-06 DIAGNOSIS — G894 Chronic pain syndrome: Secondary | ICD-10-CM | POA: Diagnosis not present

## 2023-09-06 DIAGNOSIS — R5383 Other fatigue: Secondary | ICD-10-CM | POA: Diagnosis not present

## 2023-09-06 DIAGNOSIS — R0609 Other forms of dyspnea: Secondary | ICD-10-CM | POA: Diagnosis not present

## 2023-09-06 DIAGNOSIS — M1990 Unspecified osteoarthritis, unspecified site: Secondary | ICD-10-CM | POA: Diagnosis not present

## 2023-09-06 DIAGNOSIS — M797 Fibromyalgia: Secondary | ICD-10-CM | POA: Diagnosis not present

## 2023-09-06 DIAGNOSIS — R229 Localized swelling, mass and lump, unspecified: Secondary | ICD-10-CM | POA: Diagnosis not present

## 2023-09-11 DIAGNOSIS — I700 Atherosclerosis of aorta: Secondary | ICD-10-CM | POA: Diagnosis not present

## 2023-09-11 DIAGNOSIS — N1831 Chronic kidney disease, stage 3a: Secondary | ICD-10-CM | POA: Diagnosis not present

## 2023-09-11 DIAGNOSIS — I10 Essential (primary) hypertension: Secondary | ICD-10-CM | POA: Diagnosis not present

## 2023-09-13 ENCOUNTER — Other Ambulatory Visit: Payer: Self-pay | Admitting: Gastroenterology

## 2023-09-13 DIAGNOSIS — K219 Gastro-esophageal reflux disease without esophagitis: Secondary | ICD-10-CM

## 2023-09-13 NOTE — Telephone Encounter (Signed)
 Received fax from [facility] stating :  Unfortunately , We were unable to schedule your PT . Multiple attempts were made to contact Pt by phone /voicemail. Pt has not returned  or answer calls.  Please contact [facility] again if this Pt would like to schedule with our office .   Call Pt informed that [facility] is trying to contact her to get scheduled . PT sates she will give them call. PT  states she has number to schedule appt .

## 2023-09-14 DIAGNOSIS — I10 Essential (primary) hypertension: Secondary | ICD-10-CM | POA: Diagnosis not present

## 2023-09-14 DIAGNOSIS — N1831 Chronic kidney disease, stage 3a: Secondary | ICD-10-CM | POA: Diagnosis not present

## 2023-09-14 DIAGNOSIS — E039 Hypothyroidism, unspecified: Secondary | ICD-10-CM | POA: Diagnosis not present

## 2023-09-14 DIAGNOSIS — I700 Atherosclerosis of aorta: Secondary | ICD-10-CM | POA: Diagnosis not present

## 2023-09-14 DIAGNOSIS — E7800 Pure hypercholesterolemia, unspecified: Secondary | ICD-10-CM | POA: Diagnosis not present

## 2023-09-25 ENCOUNTER — Other Ambulatory Visit (HOSPITAL_COMMUNITY): Payer: Self-pay | Admitting: Internal Medicine

## 2023-09-25 DIAGNOSIS — R591 Generalized enlarged lymph nodes: Secondary | ICD-10-CM | POA: Diagnosis not present

## 2023-09-25 DIAGNOSIS — R221 Localized swelling, mass and lump, neck: Secondary | ICD-10-CM | POA: Diagnosis not present

## 2023-09-29 DIAGNOSIS — R229 Localized swelling, mass and lump, unspecified: Secondary | ICD-10-CM | POA: Diagnosis not present

## 2023-09-29 DIAGNOSIS — R222 Localized swelling, mass and lump, trunk: Secondary | ICD-10-CM | POA: Diagnosis not present

## 2023-10-02 ENCOUNTER — Other Ambulatory Visit

## 2023-10-03 ENCOUNTER — Encounter (HOSPITAL_COMMUNITY): Payer: Self-pay

## 2023-10-03 ENCOUNTER — Ambulatory Visit (HOSPITAL_COMMUNITY)

## 2023-10-04 ENCOUNTER — Telehealth: Payer: Self-pay | Admitting: Nurse Practitioner

## 2023-10-04 NOTE — Telephone Encounter (Signed)
 Inbound call from pt requesting a refill on her dicyclomine . Pt is requesting a call back once it has been sent over to the pharmacy. Please advise.

## 2023-10-05 ENCOUNTER — Ambulatory Visit: Admitting: Gastroenterology

## 2023-10-06 ENCOUNTER — Ambulatory Visit
Admission: RE | Admit: 2023-10-06 | Discharge: 2023-10-06 | Disposition: A | Discharge status: Home / Self Care | Source: Ambulatory Visit | Attending: Internal Medicine | Admitting: Internal Medicine

## 2023-10-06 DIAGNOSIS — R590 Localized enlarged lymph nodes: Secondary | ICD-10-CM | POA: Diagnosis not present

## 2023-10-06 DIAGNOSIS — R591 Generalized enlarged lymph nodes: Secondary | ICD-10-CM

## 2023-10-06 DIAGNOSIS — R221 Localized swelling, mass and lump, neck: Secondary | ICD-10-CM

## 2023-10-06 MED ORDER — IOPAMIDOL (ISOVUE-300) INJECTION 61%
75.0000 mL | Freq: Once | INTRAVENOUS | Status: AC | PRN
  Administered 2023-10-06: 75 mL via INTRAVENOUS

## 2023-10-08 ENCOUNTER — Other Ambulatory Visit: Payer: Self-pay | Admitting: Nurse Practitioner

## 2023-10-11 DIAGNOSIS — N1831 Chronic kidney disease, stage 3a: Secondary | ICD-10-CM | POA: Diagnosis not present

## 2023-10-11 DIAGNOSIS — I700 Atherosclerosis of aorta: Secondary | ICD-10-CM | POA: Diagnosis not present

## 2023-10-11 DIAGNOSIS — I10 Essential (primary) hypertension: Secondary | ICD-10-CM | POA: Diagnosis not present

## 2023-10-12 ENCOUNTER — Other Ambulatory Visit: Payer: Self-pay | Admitting: Internal Medicine

## 2023-10-12 ENCOUNTER — Other Ambulatory Visit (HOSPITAL_BASED_OUTPATIENT_CLINIC_OR_DEPARTMENT_OTHER): Payer: Self-pay | Admitting: Internal Medicine

## 2023-10-12 DIAGNOSIS — R222 Localized swelling, mass and lump, trunk: Secondary | ICD-10-CM

## 2023-10-13 ENCOUNTER — Ambulatory Visit
Admission: RE | Admit: 2023-10-13 | Discharge: 2023-10-13 | Disposition: A | Discharge status: Home / Self Care | Source: Ambulatory Visit | Attending: Internal Medicine | Admitting: Internal Medicine

## 2023-10-13 DIAGNOSIS — R222 Localized swelling, mass and lump, trunk: Secondary | ICD-10-CM

## 2023-10-13 DIAGNOSIS — I10 Essential (primary) hypertension: Secondary | ICD-10-CM | POA: Diagnosis not present

## 2023-10-13 MED ORDER — IOPAMIDOL (ISOVUE-370) INJECTION 76%
75.0000 mL | Freq: Once | INTRAVENOUS | Status: AC | PRN
  Administered 2023-10-13: 75 mL via INTRAVENOUS

## 2023-10-13 NOTE — Telephone Encounter (Signed)
 Left message for patient to return my call.

## 2023-10-15 DIAGNOSIS — E039 Hypothyroidism, unspecified: Secondary | ICD-10-CM | POA: Diagnosis not present

## 2023-10-15 DIAGNOSIS — N1831 Chronic kidney disease, stage 3a: Secondary | ICD-10-CM | POA: Diagnosis not present

## 2023-10-15 DIAGNOSIS — E7800 Pure hypercholesterolemia, unspecified: Secondary | ICD-10-CM | POA: Diagnosis not present

## 2023-10-15 DIAGNOSIS — I700 Atherosclerosis of aorta: Secondary | ICD-10-CM | POA: Diagnosis not present

## 2023-10-15 DIAGNOSIS — I10 Essential (primary) hypertension: Secondary | ICD-10-CM | POA: Diagnosis not present

## 2023-10-17 NOTE — Telephone Encounter (Signed)
 Explained to patient that Camie suggests she take over the counter IBgard 1 capsule twice daily as needed for abdominal pain in place of dicyclomine . Patient verbalized understanding.

## 2023-10-18 ENCOUNTER — Other Ambulatory Visit: Payer: Self-pay | Admitting: Gastroenterology

## 2023-11-01 ENCOUNTER — Ambulatory Visit: Admitting: Orthopedic Surgery

## 2023-11-06 ENCOUNTER — Other Ambulatory Visit: Payer: Self-pay | Admitting: Nurse Practitioner

## 2023-11-07 ENCOUNTER — Ambulatory Visit: Admitting: Orthopedic Surgery

## 2023-11-13 ENCOUNTER — Other Ambulatory Visit: Payer: Self-pay | Admitting: Internal Medicine

## 2023-11-14 DIAGNOSIS — I700 Atherosclerosis of aorta: Secondary | ICD-10-CM | POA: Diagnosis not present

## 2023-11-14 DIAGNOSIS — E7800 Pure hypercholesterolemia, unspecified: Secondary | ICD-10-CM | POA: Diagnosis not present

## 2023-11-14 DIAGNOSIS — F411 Generalized anxiety disorder: Secondary | ICD-10-CM | POA: Diagnosis not present

## 2023-11-14 DIAGNOSIS — E039 Hypothyroidism, unspecified: Secondary | ICD-10-CM | POA: Diagnosis not present

## 2023-11-14 DIAGNOSIS — I10 Essential (primary) hypertension: Secondary | ICD-10-CM | POA: Diagnosis not present

## 2023-11-14 DIAGNOSIS — N1831 Chronic kidney disease, stage 3a: Secondary | ICD-10-CM | POA: Diagnosis not present

## 2023-11-16 ENCOUNTER — Encounter: Payer: Self-pay | Admitting: *Deleted

## 2023-11-16 ENCOUNTER — Ambulatory Visit: Attending: Cardiology | Admitting: Internal Medicine

## 2023-11-16 ENCOUNTER — Encounter: Payer: Self-pay | Admitting: Internal Medicine

## 2023-11-16 VITALS — BP 122/84 | HR 70 | Ht 64.0 in | Wt 139.0 lb

## 2023-11-16 DIAGNOSIS — R0609 Other forms of dyspnea: Secondary | ICD-10-CM

## 2023-11-16 DIAGNOSIS — R55 Syncope and collapse: Secondary | ICD-10-CM

## 2023-11-16 DIAGNOSIS — I10 Essential (primary) hypertension: Secondary | ICD-10-CM | POA: Diagnosis not present

## 2023-11-16 DIAGNOSIS — I700 Atherosclerosis of aorta: Secondary | ICD-10-CM | POA: Diagnosis not present

## 2023-11-16 DIAGNOSIS — E782 Mixed hyperlipidemia: Secondary | ICD-10-CM | POA: Diagnosis not present

## 2023-11-16 NOTE — Progress Notes (Signed)
 Patient ID: [name], female   DOB: May 02, 1940, 83 y.o.   MRN: [id number] Patient enrolled for [employer] to ship a 30 day cardiac event monitor to his address on file. Letter with instructions mailed to patient.

## 2023-11-16 NOTE — Patient Instructions (Signed)
   Testing/Procedures:  Your physician has requested that you have an echocardiogram. Echocardiography is a painless test that uses sound waves to create images of your heart. It provides your doctor with information about the size and shape of your heart and how well your heart's chambers and valves are working. This procedure takes approximately one hour. There are no restrictions for this procedure. Please do NOT wear cologne, perfume, aftershave, or lotions (deodorant is allowed). Please arrive 15 minutes prior to your appointment time.  Please note: We ask at that you not bring children with you during ultrasound (echo/ vascular) testing. Due to room size and safety concerns, children are not allowed in the ultrasound rooms during exams. Our front office staff cannot provide observation of children in our lobby area while testing is being conducted. An adult accompanying a patient to their appointment will only be allowed in the ultrasound room at the discretion of the ultrasound technician under special circumstances. We apologize for any inconvenience. [address]  Your physician has requested that you have a lexiscan  myoview. For further information please visit [email or web address] Please follow instruction sheet, as given. [address]   Your physician has recommended that you wear an event monitor. Event monitors are medical devices that record the heart's electrical activity. Doctors most often us  these monitors to diagnose arrhythmias. Arrhythmias are problems with the speed or rhythm of the heartbeat. The monitor is a small, portable device. You can wear one while you do your normal daily activities. This is usually used to diagnose what is causing palpitations/syncope (passing out).   Follow-Up: At [facility], you and your health needs are our priority.  As part of our continuing mission to provide you with exceptional heart care, our providers are all part of one  team.  This team includes your primary Cardiologist (physician) and Advanced Practice Providers or APPs (Physician Assistants and Nurse Practitioners) who all work together to provide you with the care you need, when you need it.  Your next appointment:   3 month(s)  Provider:   EMELINE CALENDER

## 2023-11-16 NOTE — Progress Notes (Signed)
 Cardiology Office Note   Date:  11/16/2023  ID:  Larina, Lieurance 23-May-1940, MRN [id number] PCP: Ransom Other, MD  [facility] Providers Cardiologist:  None     History of Present Illness [name] is a 83 y.o. female with a past medical history of hypertension, hyperlipidemia with statin intolerance, fibromyalgia, pancreatic cyst, mild bilateral carotid artery disease, CKD 3, aortic atherosclerosis who was referred by her PCP for dyspnea on exertion.  Worsening dyspnea on exertion for at least the past year and now with minimal exertion.  Yesterday had symptoms while sweeping her porch.  Does not get chest pain with this or any other associated symptoms.  Says that if she continues exerting herself then she feels like she may pass out.  Has used her inhaler during some of these episodes which does occasionally help.  Also states that at least once per month while simply sitting and at rest or after standing she gets presyncopal symptoms and feels like she will fall over.  No associated palpitations or chest pain with this and may or may not have completely syncopized in the past.  No known cardiac issues.  Tobacco use: No Activity level: Minimal  ROS:  Review of Systems  All other systems reviewed and are negative.   Physical Exam  Physical Exam Vitals and nursing note reviewed.  Constitutional:      Appearance: Normal appearance.  HENT:     Head: Normocephalic and atraumatic.  Eyes:     Conjunctiva/sclera: Conjunctivae normal.  Neck:     Vascular: No carotid bruit.  Cardiovascular:     Rate and Rhythm: Normal rate and regular rhythm.  Pulmonary:     Effort: Pulmonary effort is normal.     Breath sounds: Normal breath sounds.  Musculoskeletal:        General: No swelling or tenderness.  Skin:    Coloration: Skin is not jaundiced or pale.  Neurological:     Mental Status: She is alert.     VS:  BP 122/84 (BP Location: Left Arm, Patient  Position: Sitting, Cuff Size: Normal)   Pulse 70   Ht 5' 4 (1.626 m)   Wt 139 lb (63 kg)   SpO2 95%   BMI 23.86 kg/m         Wt Readings from Last 3 Encounters:  11/16/23 139 lb (63 kg)  07/07/23 139 lb 8 oz (63.3 kg)  06/22/23 141 lb 3.2 oz (64 kg)     EKG Interpretation Date/Time:  Thursday November 16 2023 15:24:29 EDT Ventricular Rate:  70 PR Interval:  150 QRS Duration:  86 QT Interval:  386 QTC Calculation: 416 R Axis:   65  Text Interpretation: Normal sinus rhythm Normal ECG When compared with ECG of 24-Aug-2021 16:14, No significant change since Confirmed by Kriste Hicks [id number]) on 11/16/2023 3:28:06 PM    Studies Reviewed    Bilateral carotid duplex 01/29/2015: Mild plaque bilaterally with no stenosis    Risk Assessment/Calculations              ASSESSMENT  Dyspnea on exertion, possibly cardiac now with minimal exertion but no chest pain Presyncopal symptoms while at rest or standing unclear etiology.  Cannot rule out an underlying arrhythmia or pauses.  Occurs at least once per month without any inciting factor.  May or may not have syncopized completely in the past. Bilateral carotid artery plaque Hypertension  Hyperlipidemia Aortic atherosclerosis   Plan  Echocardiogram Pharmacologic SPECT 2-week event  monitor If above workup is unremarkable then can consider a carotid duplex  Follow up: 3 months     Informed Consent   Shared Decision Making/Informed Consent The risks [chest pain, shortness of breath, cardiac arrhythmias, dizziness, blood pressure fluctuations, myocardial infarction, stroke/transient ischemic attack, nausea, vomiting, allergic reaction, radiation exposure, metallic taste sensation and life-threatening complications (estimated to be 1 in 10,000)], benefits (risk stratification, diagnosing coronary artery disease, treatment guidance) and alternatives of a nuclear stress test were discussed in detail with [name] and she  agrees to proceed.       Signed, Emeline FORBES Calender, MD

## 2023-11-21 DIAGNOSIS — M19011 Primary osteoarthritis, right shoulder: Secondary | ICD-10-CM | POA: Diagnosis not present

## 2023-11-21 DIAGNOSIS — R2232 Localized swelling, mass and lump, left upper limb: Secondary | ICD-10-CM | POA: Diagnosis not present

## 2023-11-21 DIAGNOSIS — M25511 Pain in right shoulder: Secondary | ICD-10-CM | POA: Diagnosis not present

## 2023-11-21 DIAGNOSIS — M19012 Primary osteoarthritis, left shoulder: Secondary | ICD-10-CM | POA: Diagnosis not present

## 2023-11-21 DIAGNOSIS — M25512 Pain in left shoulder: Secondary | ICD-10-CM | POA: Diagnosis not present

## 2023-11-23 ENCOUNTER — Telehealth: Payer: Self-pay | Admitting: Internal Medicine

## 2023-11-23 NOTE — Telephone Encounter (Signed)
  1. Is this related to a heart monitor you are wearing?  (If the patient says no, please ask     if they are caling about ICD/pacemaker.) Heart Monitor  2. What is your issue?? Pt wants to talk to nurse about monitor and if she needs to wear it right now   Please route to covering RN/CMA/RMA for results. Route to monitor technicians or your monitor tech representative for your site for any technical concerns

## 2023-11-23 NOTE — Telephone Encounter (Signed)
 Spoke to patient she stated she forgot to tell Dr.Segal she has 2 tumors 1 on left upper chest about the size of a silver dollar and 1 under left arm.She is scheduled to see a Dr.at [facility] 10/27.Stated wanted to make sure monitor will not effect tumors.Advised monitor will be ok to wear.Stated she prefers to come to office to have put on.Advised I will check with monitor tech for a date and time.  Spoke to patient advised monitor tech is not in office.Stated since she spoke to me she called monitor company and tech there can help her put on monitor.Stated they still have order activated.She will take a bath and tech will help her over the phone.She will wear for 14 days and mail back.I will make Dr.Segal aware.

## 2023-12-01 ENCOUNTER — Telehealth: Payer: Self-pay | Admitting: Internal Medicine

## 2023-12-01 NOTE — Telephone Encounter (Signed)
 Pt states she has received a heart monitor, but read the device is sensitive to xray. She would like to put off wearing the monitor because she has some upcoming xrays. Please advise.

## 2023-12-01 NOTE — Telephone Encounter (Signed)
 Returned call to patient and answered all her questions. She should go ahead and apply the monitor and on the day of her xray take the monitor off and reapply after. She also is aware if she needs help to call us  back and we will set her up an appt to come in.

## 2023-12-07 NOTE — Telephone Encounter (Signed)
 Pt is requesting a callback regarding her having a lot going on right now so she still hasn't been able to place monitor on and she stated she'll need assistance to put it on. She'd like to discuss further with a nurse. Please advise.

## 2023-12-07 NOTE — Telephone Encounter (Signed)
 This is not our patient.  We manage PPM/ICD/ILR patients.   Forwarding to event monitor team to reach out to patient.

## 2023-12-11 DIAGNOSIS — R2232 Localized swelling, mass and lump, left upper limb: Secondary | ICD-10-CM | POA: Diagnosis not present

## 2023-12-11 NOTE — Telephone Encounter (Signed)
 LMVM returning call to schedule an appointment for a monitor application.

## 2023-12-13 ENCOUNTER — Telehealth: Payer: Self-pay | Admitting: Internal Medicine

## 2023-12-13 NOTE — Telephone Encounter (Signed)
 Patient wants to get appointment to have her heart monitor placed.  Patient wants to get appointment for late tomorrow (10/30) afternoon if possible as she will need to have her son bring her.

## 2023-12-14 NOTE — Telephone Encounter (Signed)
 Returned call to patient she isnt feeling well today and would like to wait till next week. She would like to come on Monday around 3:30.

## 2023-12-15 DIAGNOSIS — E039 Hypothyroidism, unspecified: Secondary | ICD-10-CM | POA: Diagnosis not present

## 2023-12-15 DIAGNOSIS — I10 Essential (primary) hypertension: Secondary | ICD-10-CM | POA: Diagnosis not present

## 2023-12-15 DIAGNOSIS — F411 Generalized anxiety disorder: Secondary | ICD-10-CM | POA: Diagnosis not present

## 2023-12-15 DIAGNOSIS — E7800 Pure hypercholesterolemia, unspecified: Secondary | ICD-10-CM | POA: Diagnosis not present

## 2023-12-15 DIAGNOSIS — N1831 Chronic kidney disease, stage 3a: Secondary | ICD-10-CM | POA: Diagnosis not present

## 2023-12-15 DIAGNOSIS — I700 Atherosclerosis of aorta: Secondary | ICD-10-CM | POA: Diagnosis not present

## 2023-12-18 ENCOUNTER — Ambulatory Visit

## 2023-12-25 ENCOUNTER — Telehealth (HOSPITAL_COMMUNITY): Payer: Self-pay

## 2023-12-25 NOTE — Telephone Encounter (Signed)
 Spoke with the patient, detailed instructions given. She stated she would be here for her test. S.Prisca Gearing CCT

## 2024-01-01 ENCOUNTER — Other Ambulatory Visit: Payer: Self-pay | Admitting: Internal Medicine

## 2024-01-01 DIAGNOSIS — R0609 Other forms of dyspnea: Secondary | ICD-10-CM

## 2024-01-02 ENCOUNTER — Ambulatory Visit (HOSPITAL_BASED_OUTPATIENT_CLINIC_OR_DEPARTMENT_OTHER)
Admission: RE | Admit: 2024-01-02 | Discharge: 2024-01-02 | Disposition: A | Discharge status: Home / Self Care | Source: Ambulatory Visit | Attending: Internal Medicine | Admitting: Internal Medicine

## 2024-01-02 ENCOUNTER — Ambulatory Visit: Payer: Self-pay | Admitting: Internal Medicine

## 2024-01-02 ENCOUNTER — Ambulatory Visit

## 2024-01-02 ENCOUNTER — Ambulatory Visit (HOSPITAL_COMMUNITY)
Admission: RE | Admit: 2024-01-02 | Discharge: 2024-01-02 | Disposition: A | Discharge status: Home / Self Care | Source: Ambulatory Visit | Attending: Cardiology | Admitting: Cardiology

## 2024-01-02 DIAGNOSIS — R0609 Other forms of dyspnea: Secondary | ICD-10-CM | POA: Insufficient documentation

## 2024-01-02 LAB — ECHOCARDIOGRAM COMPLETE
Area-P 1/2: 3.34 cm2
Est EF: >75
P 1/2 time: 395 ms
S' Lateral: 2.3 cm

## 2024-01-02 LAB — MYOCARDIAL PERFUSION IMAGING
Base ST Depression (mm): 0 mm
LV dias vol: 50 mL (ref 46–106)
LV sys vol: 15 mL (ref 3.8–5.2)
Nuc Stress EF: 70 %
Peak HR: 91 {beats}/min
Rest HR: 66 {beats}/min
Rest Nuclear Isotope Dose: 9.7 mCi
SDS: 0
SRS: 2
SSS: 0
ST Depression (mm): 0 mm
Stress Nuclear Isotope Dose: 30.4 mCi
TID: 1.04

## 2024-01-02 MED ORDER — TECHNETIUM TC 99M TETROFOSMIN IV KIT
30.4000 | PACK | Freq: Once | INTRAVENOUS | Status: AC | PRN
  Administered 2024-01-02: 30.4 via INTRAVENOUS

## 2024-01-02 MED ORDER — TECHNETIUM TC 99M TETROFOSMIN IV KIT
9.7000 | PACK | Freq: Once | INTRAVENOUS | Status: AC | PRN
  Administered 2024-01-02: 9.7 via INTRAVENOUS

## 2024-01-02 MED ORDER — REGADENOSON 0.4 MG/5ML IV SOLN
0.4000 mg | Freq: Once | INTRAVENOUS | Status: AC
  Administered 2024-01-02: 0.4 mg via INTRAVENOUS

## 2024-01-03 ENCOUNTER — Ambulatory Visit: Admitting: Gastroenterology

## 2024-01-05 ENCOUNTER — Ambulatory Visit: Attending: Internal Medicine

## 2024-01-05 DIAGNOSIS — R55 Syncope and collapse: Secondary | ICD-10-CM

## 2024-01-08 DIAGNOSIS — M19011 Primary osteoarthritis, right shoulder: Secondary | ICD-10-CM | POA: Diagnosis not present

## 2024-01-08 DIAGNOSIS — M797 Fibromyalgia: Secondary | ICD-10-CM | POA: Diagnosis not present

## 2024-01-09 NOTE — Telephone Encounter (Signed)
 I called and spoke with the patient. I let her know of her echo results and Dr. Ali recommendations below,   Per Dr. Kriste, Please let the patient know her echocardiogram did not show any concerning findings related to her symptoms. Also her stress test was normal. Her symptoms are not likely cardiac in origin.. She stated that she understood the results but is having a skin reaction/rash to the event monitor she placed on herself this Saturday 11/22. She wants to know if she has to continue wearing it or not? I will ask Dr. Kriste and our device team if we can get an alternative to the tape she has on currently and let the patient know.

## 2024-01-10 NOTE — Telephone Encounter (Signed)
 I called the patient and let her know what our monitor team suggested about the skin sensitivity to the tape. [name] stated that she applied the monitor just last Friday and in box 3 in her kit there should be an alternative use wired necklace with enclosed sensitive skin electrodes. She was aware to call the company if she needed to switch to the sensitive skin alternative. I let her know of this information and she said she will keep us  updated when she changes the electrodes out.   Per Dr. Kriste, If she is having significant discomfort then I am okay with her stopping that test early.. I let the patient know of this as well and she said she will try the sensitive electrodes first and let us  know if she does take the monitor off early.

## 2024-01-14 DIAGNOSIS — N1831 Chronic kidney disease, stage 3a: Secondary | ICD-10-CM | POA: Diagnosis not present

## 2024-01-14 DIAGNOSIS — I10 Essential (primary) hypertension: Secondary | ICD-10-CM | POA: Diagnosis not present

## 2024-01-14 DIAGNOSIS — I700 Atherosclerosis of aorta: Secondary | ICD-10-CM | POA: Diagnosis not present

## 2024-01-18 ENCOUNTER — Other Ambulatory Visit (HOSPITAL_BASED_OUTPATIENT_CLINIC_OR_DEPARTMENT_OTHER): Payer: Self-pay

## 2024-01-24 ENCOUNTER — Telehealth: Payer: Self-pay | Admitting: Gastroenterology

## 2024-01-24 DIAGNOSIS — R55 Syncope and collapse: Secondary | ICD-10-CM | POA: Diagnosis not present

## 2024-01-24 NOTE — Telephone Encounter (Signed)
 PT is calling about having stomach pains and discomfort. She took her Linzess  twice a day, two days in a row and they are 290 mg each. She is concerned this may be the reason but she is asking for a prescription be sent out for her relief. Please advise.

## 2024-01-25 NOTE — Telephone Encounter (Signed)
 Spoke with the patient. She accidentally took the Linzess  290 mcg2 of them because I was used to taking Linzess  145 mcg 2 a day. She understands her maximum daily dose is 290 mcg of Linzess . She will resume at this dosage.  Asks what she can take when her stomach doesn't feel good. Reminded her of IBgard for lower intestinal issues such as bloating or nausea. FDgard for upset stomach or gas. She can take these PRN.

## 2024-02-10 ENCOUNTER — Other Ambulatory Visit: Payer: Self-pay

## 2024-02-10 ENCOUNTER — Observation Stay (HOSPITAL_COMMUNITY)
Admission: EM | Admit: 2024-02-10 | Discharge: 2024-02-11 | Disposition: A | Discharge status: Home / Self Care | Attending: Internal Medicine | Admitting: Internal Medicine

## 2024-02-10 ENCOUNTER — Encounter (HOSPITAL_COMMUNITY): Payer: Self-pay | Admitting: Emergency Medicine

## 2024-02-10 ENCOUNTER — Emergency Department (HOSPITAL_COMMUNITY)

## 2024-02-10 DIAGNOSIS — G894 Chronic pain syndrome: Secondary | ICD-10-CM | POA: Diagnosis not present

## 2024-02-10 DIAGNOSIS — Z91040 Latex allergy status: Secondary | ICD-10-CM | POA: Insufficient documentation

## 2024-02-10 DIAGNOSIS — K5900 Constipation, unspecified: Secondary | ICD-10-CM | POA: Diagnosis not present

## 2024-02-10 DIAGNOSIS — K529 Noninfective gastroenteritis and colitis, unspecified: Principal | ICD-10-CM | POA: Diagnosis present

## 2024-02-10 DIAGNOSIS — E876 Hypokalemia: Secondary | ICD-10-CM | POA: Diagnosis not present

## 2024-02-10 DIAGNOSIS — E871 Hypo-osmolality and hyponatremia: Secondary | ICD-10-CM | POA: Diagnosis not present

## 2024-02-10 DIAGNOSIS — I10 Essential (primary) hypertension: Secondary | ICD-10-CM

## 2024-02-10 DIAGNOSIS — Z7982 Long term (current) use of aspirin: Secondary | ICD-10-CM | POA: Insufficient documentation

## 2024-02-10 DIAGNOSIS — F418 Other specified anxiety disorders: Secondary | ICD-10-CM

## 2024-02-10 DIAGNOSIS — F419 Anxiety disorder, unspecified: Secondary | ICD-10-CM | POA: Insufficient documentation

## 2024-02-10 DIAGNOSIS — E039 Hypothyroidism, unspecified: Secondary | ICD-10-CM | POA: Insufficient documentation

## 2024-02-10 DIAGNOSIS — N183 Chronic kidney disease, stage 3 unspecified: Secondary | ICD-10-CM | POA: Insufficient documentation

## 2024-02-10 DIAGNOSIS — Z79899 Other long term (current) drug therapy: Secondary | ICD-10-CM | POA: Insufficient documentation

## 2024-02-10 DIAGNOSIS — F32A Depression, unspecified: Secondary | ICD-10-CM | POA: Diagnosis not present

## 2024-02-10 DIAGNOSIS — I129 Hypertensive chronic kidney disease with stage 1 through stage 4 chronic kidney disease, or unspecified chronic kidney disease: Secondary | ICD-10-CM | POA: Diagnosis not present

## 2024-02-10 DIAGNOSIS — J45909 Unspecified asthma, uncomplicated: Secondary | ICD-10-CM | POA: Diagnosis not present

## 2024-02-10 DIAGNOSIS — R109 Unspecified abdominal pain: Secondary | ICD-10-CM | POA: Diagnosis present

## 2024-02-10 HISTORY — DX: Noninfective gastroenteritis and colitis, unspecified: K52.9

## 2024-02-10 LAB — URINALYSIS, ROUTINE W REFLEX MICROSCOPIC
Bilirubin Urine: NEGATIVE
Glucose, UA: NEGATIVE mg/dL
Hgb urine dipstick: NEGATIVE
Ketones, ur: NEGATIVE mg/dL
Leukocytes,Ua: NEGATIVE
Nitrite: NEGATIVE
Protein, ur: NEGATIVE mg/dL
Specific Gravity, Urine: 1.021 (ref 1.005–1.030)
pH: 6 (ref 5.0–8.0)

## 2024-02-10 LAB — CBC
HCT: 43.4 % (ref 36.0–46.0)
Hemoglobin: 15 g/dL (ref 12.0–15.0)
MCH: 33.8 pg (ref 26.0–34.0)
MCHC: 34.6 g/dL (ref 30.0–36.0)
MCV: 97.7 fL (ref 80.0–100.0)
Platelets: 262 K/uL (ref 150–400)
RBC: 4.44 MIL/uL (ref 3.87–5.11)
RDW: 12 % (ref 11.5–15.5)
WBC: 17.7 K/uL — ABNORMAL HIGH (ref 4.0–10.5)
nRBC: 0 % (ref 0.0–0.2)

## 2024-02-10 LAB — COMPREHENSIVE METABOLIC PANEL WITH GFR
ALT: 11 U/L (ref 0–44)
AST: 24 U/L (ref 15–41)
Albumin: 4.5 g/dL (ref 3.5–5.0)
Alkaline Phosphatase: 88 U/L (ref 38–126)
Anion gap: 19 — ABNORMAL HIGH (ref 5–15)
BUN: 16 mg/dL (ref 8–23)
CO2: 20 mmol/L — ABNORMAL LOW (ref 22–32)
Calcium: 10.7 mg/dL — ABNORMAL HIGH (ref 8.9–10.3)
Chloride: 93 mmol/L — ABNORMAL LOW (ref 98–111)
Creatinine, Ser: 0.99 mg/dL (ref 0.44–1.00)
GFR, Estimated: 57 mL/min — ABNORMAL LOW
Glucose, Bld: 140 mg/dL — ABNORMAL HIGH (ref 70–99)
Potassium: 3 mmol/L — ABNORMAL LOW (ref 3.5–5.1)
Sodium: 132 mmol/L — ABNORMAL LOW (ref 135–145)
Total Bilirubin: 0.5 mg/dL (ref 0.0–1.2)
Total Protein: 7.5 g/dL (ref 6.5–8.1)

## 2024-02-10 LAB — LIPASE, BLOOD: Lipase: 28 U/L (ref 11–51)

## 2024-02-10 MED ORDER — ONDANSETRON HCL 4 MG/2ML IJ SOLN
4.0000 mg | Freq: Once | INTRAMUSCULAR | Status: AC
  Administered 2024-02-10: 4 mg via INTRAVENOUS
  Filled 2024-02-10: qty 2

## 2024-02-10 MED ORDER — SODIUM CHLORIDE 0.9 % IV BOLUS
1000.0000 mL | Freq: Once | INTRAVENOUS | Status: AC
  Administered 2024-02-10: 1000 mL via INTRAVENOUS

## 2024-02-10 MED ORDER — HYDROMORPHONE HCL 1 MG/ML IJ SOLN
0.5000 mg | Freq: Once | INTRAMUSCULAR | Status: AC
  Administered 2024-02-10: 0.5 mg via INTRAVENOUS
  Filled 2024-02-10: qty 0.5

## 2024-02-10 MED ORDER — IOHEXOL 300 MG/ML  SOLN
100.0000 mL | Freq: Once | INTRAMUSCULAR | Status: AC | PRN
  Administered 2024-02-10: 80 mL via INTRAVENOUS

## 2024-02-10 MED ORDER — SODIUM CHLORIDE 0.9 % IV SOLN
2.0000 g | Freq: Once | INTRAVENOUS | Status: AC
  Administered 2024-02-10: 2 g via INTRAVENOUS
  Filled 2024-02-10: qty 20

## 2024-02-10 MED ORDER — METRONIDAZOLE 500 MG/100ML IV SOLN
500.0000 mg | Freq: Once | INTRAVENOUS | Status: AC
  Administered 2024-02-11: 500 mg via INTRAVENOUS
  Filled 2024-02-10: qty 100

## 2024-02-10 NOTE — H&P (Incomplete)
 " History and Physical    Patient: [name] FMW:[id number] DOB: Aug 01, 1940 DOA: 02/10/2024 DOS: the patient was seen and examined on 02/10/2024 PCP: Ransom Other, MD  Patient coming from: {Point_of_Origin:[address]}  Chief Complaint:  Chief Complaint  Patient presents with   Abdominal Pain   HPI: [name] is a 83 y.o. female with medical history significant of ***  Review of Systems: {ROS_Text:26778} Past Medical History:  Diagnosis Date   ADD (attention deficit disorder)    Anxiety    Arthritis    Asthma    Chronic kidney disease    CKD Stage 3   Chronic renal insufficiency, stage III (moderate)    COVID-19    Depression    major depression   Dermatitis    Diverticulosis    Essential tremor 05/17/2012   Fibromyalgia    GERD (gastroesophageal reflux disease)    Hyperlipidemia    Hypertension    IBD (inflammatory bowel disease)    Memory loss 05/17/2012   Migraines    Occasional tremors    Pneumonia    TIA (transient ischemic attack)    History of 2 TIA's per pt   Tremor    Past Surgical History:  Procedure Laterality Date   ABDOMINAL HYSTERECTOMY     APPENDECTOMY     CATARACT EXTRACTION Bilateral    TONSILLECTOMY AND ADENOIDECTOMY     age 39   TOTAL HIP ARTHROPLASTY Bilateral 2009   Dr Adina Car   Social History:  reports that she has never smoked. She has never used smokeless tobacco. She reports that she does not drink alcohol and does not use drugs.  Allergies[1]  Family History  Problem Relation Age of Onset   Liver cancer Mother    Pulmonary embolism Mother    Prostate cancer Father    Depression Brother        suicide   Lung cancer Maternal Aunt    ALS Paternal Aunt    Brain cancer Paternal Aunt    Breast cancer Paternal Aunt    Diabetes Paternal Aunt        x 2   Prostate cancer Paternal Uncle        x 2   Stroke Maternal Grandmother    Heart failure Maternal Grandfather    Emphysema Maternal Grandfather    Congestive  Heart Failure Maternal Grandfather    Coronary artery disease Paternal Grandmother    Stroke Paternal Grandmother    Coronary artery disease Paternal Grandfather    Breast cancer Cousin    Colon cancer Neg Hx    Esophageal cancer Neg Hx    Rectal cancer Neg Hx    Stomach cancer Neg Hx     Prior to Admission medications  Medication Sig Start Date End Date Taking? Authorizing Provider  albuterol  (VENTOLIN  HFA) 108 (90 Base) MCG/ACT inhaler Inhale 1 puff into the lungs as needed. 02/22/21   [provider]  amLODipine  (NORVASC ) 2.5 MG tablet 2.5 mg. 07/24/23   [provider]  aspirin  EC 81 MG tablet Take 81 mg by mouth daily.    [provider]  bismuth subsalicylate (PEPTO BISMOL) 262 MG/15ML suspension Take 30 mLs by mouth every 6 (six) hours as needed.    [provider]  busPIRone  (BUSPAR ) 5 MG tablet Take 5 mg by mouth 2 (two) times daily. 06/16/21   [provider]  Coenzyme Q10 (CO Q-10) 100 MG CAPS Take 100 mg by mouth daily.    [provider]  dicyclomine  (BENTYL ) 10 MG capsule Take 1 capsule (10 mg total) by mouth every 8 (eight) hours as needed for spasms. 10/20/22   Kennedy-Smith, Colleen M, NP  DULoxetine  (CYMBALTA ) 30 MG capsule Take 30 mg by mouth daily. 02/25/21   [provider]  ergocalciferol (VITAMIN D2) 50000 UNITS capsule Take 50,000 Units by mouth once a week.    [provider]  famotidine  (PEPCID ) 20 MG tablet TAKE 1 TABLET BY MOUTH TWICE A DAY 09/14/23   Nandigam, Kavitha V, MD  HYDROcodone -acetaminophen  (NORCO) 7.5-325 MG tablet Take 1 tablet by mouth 2 (two) times daily as needed for moderate pain (pain score 4-6).    [provider]  hyoscyamine  (LEVSIN  SL) 0.125 MG SL tablet TAKE 1 TABLET (0.125 MG TOTAL) BY MOUTH EVERY 12 (TWELVE) HOURS AS NEEDED. 11/13/23   Pyrtle, Gordy HERO, MD  ipratropium (ATROVENT ) 0.06 % nasal spray Place into both nostrils as needed. 01/18/18   [provider]   levothyroxine  (SYNTHROID , LEVOTHROID) 50 MCG tablet Take 50 mcg by mouth daily before breakfast. Once daily 08/07/13   [provider]  LINZESS  290 MCG CAPS capsule TAKE 1 CAPSULE BY MOUTH DAILY BEFORE BREAKFAST. 10/18/23   Heinz, Sara E, PA-C  LORazepam  (ATIVAN ) 1 MG tablet Take 1 tablet by mouth at bedtime. 05/17/14   [provider]  losartan  (COZAAR ) 100 MG tablet Take 100 mg by mouth daily.    [provider]  ondansetron  (ZOFRAN ) 4 MG tablet Take 4 mg by mouth as needed for nausea or vomiting.    [provider]  SUMAtriptan  (IMITREX ) 50 MG tablet Take 50 mg by mouth as needed. For migraine    [provider]  tizanidine (ZANAFLEX) 2 MG capsule TAKE 1 CAPSULE BY MOUTH THREE TIMES A DAY AS NEEDED; Duration: 10 04/04/22   [provider]  triamcinolone  (KENALOG ) 0.025 % ointment Apply 1 application topically 2 (two) times daily. 06/12/20   Iva Marty Saltness, MD    Physical Exam: Vitals:   02/10/24 1827 02/10/24 2030 02/10/24 2130 02/10/24 2300  BP:  135/71 138/84 (!) 145/78  Pulse:  79 70 75  Resp:  18 18 20   Temp:      TempSrc:      SpO2:  100% 92% 99%  Weight: 61.2 kg     Height: 5' 4 (1.626 m)      *** Data Reviewed: {Tip this will not be part of the note when signed- Document your independent interpretation of telemetry tracing, EKG, lab, Radiology test or any other diagnostic tests. Add any new diagnostic test ordered today. (Optional):26781} {Results:26384}  Assessment and Plan: No notes have been filed under this hospital service. Service: Hospitalist     Advance Care Planning:   Code Status: Prior ***  Consults: ***  Family Communication: ***  Severity of Illness: {Observation/Inpatient:21159}  Author: Posey Maier, DO 02/10/2024 11:49 PM  For on call review [email or web address].     [1]  Allergies Allergen Reactions   Crestor [Rosuvastatin Calcium]     myalgia   Zocor [Simvastatin]     myalgia    Enalapril     swelling   Lamictal [Lamotrigine]     rash   Nitrofuran Derivatives Nausea Only   Erythromycin Other (See Comments)    Stomach burning   Methylprednisolone      Major depression, weakness, nausea   Nystatin  Other (See Comments)    Blisters on her lips   Other     Allergic to certain  manufacturers for Hydrocodone  (Tris & Mallencrot) causes her to feel suicidal   Vivelle [Estradiol]     rash   Zetia [Ezetimibe]     Muscle weakness   Ciprofloxacin Nausea Only   Ciprofloxacin Hcl     rash   Latex Rash   Shellfish Allergy Rash   Sulfa Antibiotics Nausea Only   Sulfasalazine Nausea Only   "

## 2024-02-10 NOTE — ED Provider Notes (Signed)
 " [place] [facility] AT [facility] Provider Note  CSN: [id number] Arrival date & time: 02/10/24 1751  Chief Complaint(s) Abdominal Pain  HPI [name] is a 83 y.o. female history of CKD, hypertension, lipidemia, presenting to the emergency department with abdominal pain.  Patient reports abdominal pain in the left abdomen.  She reports that the pain associated with nausea, no vomiting.  No fevers or chills.  No chest pain or shortness of breath.  Denies urinary symptoms.  No flank pain.  No history of similar.  No diarrhea or constipation.   Past Medical History Past Medical History:  Diagnosis Date   ADD (attention deficit disorder)    Anxiety    Arthritis    Asthma    Chronic kidney disease    CKD Stage 3   Chronic renal insufficiency, stage III (moderate)    COVID-19    Depression    major depression   Dermatitis    Diverticulosis    Essential tremor 05/17/2012   Fibromyalgia    GERD (gastroesophageal reflux disease)    Hyperlipidemia    Hypertension    IBD (inflammatory bowel disease)    Memory loss 05/17/2012   Migraines    Occasional tremors    Pneumonia    TIA (transient ischemic attack)    History of 2 TIA's per pt   Tremor    Patient Active Problem List   Diagnosis Date Noted   Colitis 02/10/2024   Cervical radiculopathy 06/22/2023   Chronic kidney disease, stage 3a (HCC) 06/11/2021   Oral thrush 11/13/2017   Constipation 06/05/2014   Generalized abdominal cramping 06/05/2014   Dyspnea 05/24/2012   Essential tremor 05/17/2012   Memory loss 05/17/2012   Migraine headache 03/04/2012   Chest pain 03/03/2012   Fibromyalgia    Hypertension    Hyperlipidemia    GERD (gastroesophageal reflux disease)    Asthma    Depression    Diverticulosis    ANXIETY DEPRESSION 12/08/2009   GERD 12/08/2009   History of colonic polyps 12/08/2009   Home Medication(s) Prior to Admission medications  Medication Sig Start Date End Date  Taking? Authorizing Provider  albuterol  (VENTOLIN  HFA) 108 (90 Base) MCG/ACT inhaler Inhale 1 puff into the lungs as needed. 02/22/21   [provider]  amLODipine  (NORVASC ) 2.5 MG tablet 2.5 mg. 07/24/23   [provider]  aspirin  EC 81 MG tablet Take 81 mg by mouth daily.    [provider]  bismuth subsalicylate (PEPTO BISMOL) 262 MG/15ML suspension Take 30 mLs by mouth every 6 (six) hours as needed.    [provider]  busPIRone  (BUSPAR ) 5 MG tablet Take 5 mg by mouth 2 (two) times daily. 06/16/21   [provider]  Coenzyme Q10 (CO Q-10) 100 MG CAPS Take 100 mg by mouth daily.    [provider]  dicyclomine  (BENTYL ) 10 MG capsule Take 1 capsule (10 mg total) by mouth every 8 (eight) hours as needed for spasms. 10/20/22   Kennedy-Smith, Colleen M, NP  DULoxetine  (CYMBALTA ) 30 MG capsule Take 30 mg by mouth daily. 02/25/21   [provider]  ergocalciferol (VITAMIN D2) 50000 UNITS capsule Take 50,000 Units by mouth once a week.    [provider]  famotidine  (PEPCID ) 20 MG tablet TAKE 1 TABLET BY MOUTH TWICE A DAY 09/14/23   Nandigam, Kavitha V, MD  HYDROcodone -acetaminophen  (NORCO) 7.5-325 MG tablet Take 1 tablet by mouth 2 (two) times daily as needed for moderate  pain (pain score 4-6).    [provider]  hyoscyamine  (LEVSIN  SL) 0.125 MG SL tablet TAKE 1 TABLET (0.125 MG TOTAL) BY MOUTH EVERY 12 (TWELVE) HOURS AS NEEDED. 11/13/23   Pyrtle, Gordy HERO, MD  ipratropium (ATROVENT ) 0.06 % nasal spray Place into both nostrils as needed. 01/18/18   [provider]  levothyroxine  (SYNTHROID , LEVOTHROID) 50 MCG tablet Take 50 mcg by mouth daily before breakfast. Once daily 08/07/13   [provider]  LINZESS  290 MCG CAPS capsule TAKE 1 CAPSULE BY MOUTH DAILY BEFORE BREAKFAST. 10/18/23   Heinz, Sara E, PA-C  LORazepam  (ATIVAN ) 1 MG tablet Take 1 tablet by mouth at bedtime. 05/17/14   [provider]  losartan  (COZAAR )  100 MG tablet Take 100 mg by mouth daily.    [provider]  ondansetron  (ZOFRAN ) 4 MG tablet Take 4 mg by mouth as needed for nausea or vomiting.    [provider]  SUMAtriptan  (IMITREX ) 50 MG tablet Take 50 mg by mouth as needed. For migraine    [provider]  tizanidine (ZANAFLEX) 2 MG capsule TAKE 1 CAPSULE BY MOUTH THREE TIMES A DAY AS NEEDED; Duration: 10 04/04/22   [provider]  triamcinolone  (KENALOG ) 0.025 % ointment Apply 1 application topically 2 (two) times daily. 06/12/20   Iva Marty Saltness, MD                                                                                                                                    Past Surgical History Past Surgical History:  Procedure Laterality Date   ABDOMINAL HYSTERECTOMY     APPENDECTOMY     CATARACT EXTRACTION Bilateral    TONSILLECTOMY AND ADENOIDECTOMY     age 19   TOTAL HIP ARTHROPLASTY Bilateral 2009   Dr Adina Car   Family History Family History  Problem Relation Age of Onset   Liver cancer Mother    Pulmonary embolism Mother    Prostate cancer Father    Depression Brother        suicide   Lung cancer Maternal Aunt    ALS Paternal Aunt    Brain cancer Paternal Aunt    Breast cancer Paternal Aunt    Diabetes Paternal Aunt        x 2   Prostate cancer Paternal Uncle        x 2   Stroke Maternal Grandmother    Heart failure Maternal Grandfather    Emphysema Maternal Grandfather    Congestive Heart Failure Maternal Grandfather    Coronary artery disease Paternal Grandmother    Stroke Paternal Grandmother    Coronary artery disease Paternal Grandfather    Breast cancer Cousin    Colon cancer Neg Hx    Esophageal cancer Neg Hx    Rectal cancer Neg Hx    Stomach cancer Neg Hx     Social History Social History[1] Allergies  Crestor [rosuvastatin calcium], Zocor [simvastatin], Enalapril, Lamictal [lamotrigine], Nitrofuran derivatives, Erythromycin,  Methylprednisolone , Nystatin , Other, Vivelle [estradiol], Zetia [ezetimibe], Ciprofloxacin, Ciprofloxacin hcl, Latex, Shellfish allergy, Sulfa antibiotics, and Sulfasalazine  Review of Systems Review of Systems  All other systems reviewed and are negative.   Physical Exam Vital Signs  I have reviewed the triage vital signs BP 137/82   Pulse 78   Temp (!) 97 F (36.1 C) (Temporal)   Resp 20   Ht 5' 4 (1.626 m)   Wt 61.2 kg   SpO2 91%   BMI 23.17 kg/m  Physical Exam Vitals and nursing note reviewed.  Constitutional:      General: She is not in acute distress.    Appearance: She is well-developed.  HENT:     Head: Normocephalic and atraumatic.     Mouth/Throat:     Mouth: Mucous membranes are moist.  Eyes:     Pupils: Pupils are equal, round, and reactive to light.  Cardiovascular:     Rate and Rhythm: Normal rate and regular rhythm.     Heart sounds: No murmur heard. Pulmonary:     Effort: Pulmonary effort is normal. No respiratory distress.     Breath sounds: Normal breath sounds.  Abdominal:     General: Abdomen is flat.     Palpations: Abdomen is soft.     Tenderness: There is abdominal tenderness in the left upper quadrant and left lower quadrant. There is left CVA tenderness. There is no right CVA tenderness.  Musculoskeletal:        General: No tenderness.     Right lower leg: No edema.     Left lower leg: No edema.  Skin:    General: Skin is warm and dry.  Neurological:     General: No focal deficit present.     Mental Status: She is alert. Mental status is at baseline.  Psychiatric:        Mood and Affect: Mood normal.        Behavior: Behavior normal.     ED Results and Treatments Labs (all labs ordered are listed, but only abnormal results are displayed) Labs Reviewed  COMPREHENSIVE METABOLIC PANEL WITH GFR - Abnormal; Notable for the following components:      Result Value   Sodium 132 (*)    Potassium 3.0 (*)    Chloride 93 (*)    CO2 20 (*)     Glucose, Bld 140 (*)    Calcium 10.7 (*)    GFR, Estimated 57 (*)    Anion gap 19 (*)    All other components within normal limits  CBC - Abnormal; Notable for the following components:   WBC 17.7 (*)    All other components within normal limits  LIPASE, BLOOD  URINALYSIS, ROUTINE W REFLEX MICROSCOPIC  Radiology CT ABDOMEN PELVIS W CONTRAST Result Date: 02/10/2024 EXAM: CT ABDOMEN AND PELVIS WITH CONTRAST 02/10/2024 10:01:25 PM TECHNIQUE: CT of the abdomen and pelvis was performed with the administration of intravenous contrast. 80 mL of iohexol  (OMNIPAQUE ) 300 MG/ML solution 100 mL IOHEXOL  300 MG/ML SOLN was administered. Multiplanar reformatted images are provided for review. Automated exposure control, iterative reconstruction, and/or weight-based adjustment of the mA/kV was utilized to reduce the radiation dose to as low as reasonably achievable. COMPARISON: MRI abdomen without contrast 01/10/2023, CT with iv contrast 12/17/2016, and CT from 05/22/2013. CLINICAL HISTORY: Abdominal pain, acute, nonlocalized. FINDINGS: LOWER CHEST: There is scattered linear scarring or atelectasis in the lung bases, without infiltrates. LIVER: The liver is unremarkable apart from a 7 mm cyst in the tip of segment 5. There is no mass. GALLBLADDER AND BILE DUCTS: Gallbladder is unremarkable. No biliary ductal dilatation. SPLEEN: No acute abnormality. PANCREAS: There is a chronic cystic lesion in the inferior aspect of the pancreatic body/tail segment left of the midline again measuring 2 x 1.2 cm, stable. This is also not notably changed compared to a CT from 05/22/2013. This is consistent with a side branch IPMN. No further follow-up imaging is recommended. ADRENAL GLANDS: No acute abnormality. There is no adrenal mass. KIDNEYS, URETERS AND BLADDER: There is chronic cortical thinning in both  kidneys and numerous tiny Bosniak 2 cortical cysts, and a few small Bosniak 1 cysts on the right. No follow-up imaging is recommended. There is no mass enhancement. No stones in the kidneys or ureters. No hydronephrosis. No perinephric or periureteral stranding. The bladder is mostly obscured due to metal artifact from bilateral hip replacements, but it is unremarkable where visible. GI AND BOWEL: Stomach demonstrates no acute abnormality. There is no small bowel obstruction. An appendix is not seen in this patient. There is a mobile cecum in the right pelvis. There are thickened folds with inflammatory changes in the distal transverse colon, the descending and sigmoid colon consistent with a long segment colitis, likely infectious or inflammatory as there is normal opacification of the SMA, SMV, and branches. There is no pneumatosis. The more proximal colon wall is normal in thickness. PERITONEUM AND RETROPERITONEUM: No ascites. No free air. There is a small fat-containing umbilical hernia. VASCULATURE: Aorta is normal in caliber. There is moderate aortoiliac calcific plaque without aneurysm. Normal opacification of the SMA, SMV, and branches. LYMPH NODES: No lymphadenopathy. REPRODUCTIVE ORGANS: The uterus is absent. No adnexal mass. BONES AND SOFT TISSUES: Osteopenia and degenerative change lumbar spine. Slight lumbar spine scoliosis. No acute osseous abnormality. No focal soft tissue abnormality. IMPRESSION: 1. Long segment colitis involving the distal transverse, descending, and sigmoid colon, likely infectious or inflammatory. No pneumatosis. 2. Side branch ipmn in the pancreas. Stable over multiple years. No further follow-up imaging recommended. 3. Umbilical fat hernia. Electronically signed by: Francis Quam MD 02/10/2024 11:18 PM EST RP Workstation: [id number]    Pertinent labs & imaging results that were available during my care of the patient were reviewed by me and considered in my medical decision  making (see MDM for details).  Medications Ordered in ED Medications  metroNIDAZOLE  (FLAGYL ) IVPB 500 mg (has no administration in time range)  sodium chloride  0.9 % bolus 1,000 mL (0 mLs Intravenous Stopped 02/10/24 2330)  ondansetron  (ZOFRAN ) injection 4 mg (4 mg Intravenous Given 02/10/24 2109)  HYDROmorphone  (DILAUDID ) injection 0.5 mg (0.5 mg Intravenous Given 02/10/24 2118)  iohexol  (OMNIPAQUE ) 300 MG/ML solution 100 mL (80 mLs Intravenous Contrast Given 02/10/24  2149)  cefTRIAXone  (ROCEPHIN ) 2 g in sodium chloride  0.9 % 100 mL IVPB (2 g Intravenous New Bag/Given 02/10/24 2332)                                                                                                                                     Procedures Procedures  (including critical care time)  Medical Decision Making / ED Course   MDM:  83 year old presenting with abdominal pain.  Patient is overall well-appearing, physical examination with lower abdominal pain.  Otherwise patient is well-appearing,  Differential includes diverticulitis, pancreatitis, cholecystitis, perforation, obstruction, volvulus, urinary infection such as pyelonephritis..  Will obtain further testing including CT scan.  Labs notable for leukocytosis.  Urinalysis is pending.  Will give medications for pain medication.  Will reassess.  Clinical Course as of 02/11/24 0003  Sat Feb 10, 2024  2358 CT scan shows colitis, will rx antibiotics. Discussed with hospitalist  [WS]    Clinical Course User Index [WS] Francesca Elsie CROME, MD     Additional history obtained: -Additional history obtained from family and ems -External records from outside source obtained and reviewed including: Chart review including previous notes, labs, imaging, consultation notes including prior notes    Lab Tests: -I ordered, reviewed, and interpreted labs.   The pertinent results include:   Labs Reviewed  COMPREHENSIVE METABOLIC PANEL WITH GFR -  Abnormal; Notable for the following components:      Result Value   Sodium 132 (*)    Potassium 3.0 (*)    Chloride 93 (*)    CO2 20 (*)    Glucose, Bld 140 (*)    Calcium 10.7 (*)    GFR, Estimated 57 (*)    Anion gap 19 (*)    All other components within normal limits  CBC - Abnormal; Notable for the following components:   WBC 17.7 (*)    All other components within normal limits  LIPASE, BLOOD  URINALYSIS, ROUTINE W REFLEX MICROSCOPIC    Notable for leukocytosis    Imaging Studies ordered: I ordered imaging studies including CT  On my interpretation imaging demonstrates colitis  I independently visualized and interpreted imaging. I agree with the radiologist interpretation   Medicines ordered and prescription drug management: Meds ordered this encounter  Medications   sodium chloride  0.9 % bolus 1,000 mL   ondansetron  (ZOFRAN ) injection 4 mg   HYDROmorphone  (DILAUDID ) injection 0.5 mg   iohexol  (OMNIPAQUE ) 300 MG/ML solution 100 mL   cefTRIAXone  (ROCEPHIN ) 2 g in sodium chloride  0.9 % 100 mL IVPB    Antibiotic Indication::   Intra-abdominal   metroNIDAZOLE  (FLAGYL ) IVPB 500 mg    -I have reviewed the patients home medicines and have made adjustments as needed   Reevaluation: After the interventions noted above, I reevaluated the patient and found that their symptoms have improved  Co morbidities that complicate the patient evaluation  Past Medical History:  Diagnosis Date   ADD (attention deficit disorder)    Anxiety    Arthritis    Asthma    Chronic kidney disease    CKD Stage 3   Chronic renal insufficiency, stage III (moderate)    COVID-19    Depression    major depression   Dermatitis    Diverticulosis    Essential tremor 05/17/2012   Fibromyalgia    GERD (gastroesophageal reflux disease)    Hyperlipidemia    Hypertension    IBD (inflammatory bowel disease)    Memory loss 05/17/2012   Migraines    Occasional tremors    Pneumonia    TIA  (transient ischemic attack)    History of 2 TIA's per pt   Tremor       Dispostion: Disposition decision including need for hospitalization was considered, and patient discharged from emergency department.    Final Clinical Impression(s) / ED Diagnoses Final diagnoses:  Colitis     This chart was dictated using voice recognition software.  Despite best efforts to proofread,  errors can occur which can change the documentation meaning.     [1]  Social History Tobacco Use   Smoking status: Never   Smokeless tobacco: Never  Vaping Use   Vaping status: Never Used  Substance Use Topics   Alcohol use: No    Alcohol/week: 0.0 standard drinks of alcohol   Drug use: No     Francesca Elsie CROME, MD 02/11/24 0003  "

## 2024-02-10 NOTE — ED Notes (Signed)
 Pt taken to the bathroom during triage

## 2024-02-10 NOTE — ED Triage Notes (Addendum)
 Pt to ed via [facility] c/o of abd pain that started around 1330. Hx of IBD. Denies any urinary sx. Hx of IBS. Endorses n/v  Per ems 20G  R AC 4mg  zofran  given IV BP 130s/80s NSR HR 77 97% RA

## 2024-02-11 LAB — COMPREHENSIVE METABOLIC PANEL WITH GFR
ALT: 9 U/L (ref 0–44)
AST: 20 U/L (ref 15–41)
Albumin: 3.7 g/dL (ref 3.5–5.0)
Alkaline Phosphatase: 68 U/L (ref 38–126)
Anion gap: 8 (ref 5–15)
BUN: 16 mg/dL (ref 8–23)
CO2: 27 mmol/L (ref 22–32)
Calcium: 9.3 mg/dL (ref 8.9–10.3)
Chloride: 99 mmol/L (ref 98–111)
Creatinine, Ser: 0.98 mg/dL (ref 0.44–1.00)
GFR, Estimated: 57 mL/min — ABNORMAL LOW
Glucose, Bld: 99 mg/dL (ref 70–99)
Potassium: 3.8 mmol/L (ref 3.5–5.1)
Sodium: 134 mmol/L — ABNORMAL LOW (ref 135–145)
Total Bilirubin: 0.4 mg/dL (ref 0.0–1.2)
Total Protein: 5.9 g/dL — ABNORMAL LOW (ref 6.5–8.1)

## 2024-02-11 LAB — CBC
HCT: 33.7 % — ABNORMAL LOW (ref 36.0–46.0)
Hemoglobin: 11.6 g/dL — ABNORMAL LOW (ref 12.0–15.0)
MCH: 33.4 pg (ref 26.0–34.0)
MCHC: 34.4 g/dL (ref 30.0–36.0)
MCV: 97.1 fL (ref 80.0–100.0)
Platelets: 189 K/uL (ref 150–400)
RBC: 3.47 MIL/uL — ABNORMAL LOW (ref 3.87–5.11)
RDW: 12.3 % (ref 11.5–15.5)
WBC: 7.7 K/uL (ref 4.0–10.5)
nRBC: 0 % (ref 0.0–0.2)

## 2024-02-11 LAB — MAGNESIUM: Magnesium: 2.1 mg/dL (ref 1.7–2.4)

## 2024-02-11 LAB — SODIUM, URINE, RANDOM: Sodium, Ur: <30 mmol/L

## 2024-02-11 LAB — OSMOLALITY, URINE: Osmolality, Ur: 245 mosm/kg — ABNORMAL LOW (ref 300–900)

## 2024-02-11 LAB — PHOSPHORUS: Phosphorus: 2.5 mg/dL (ref 2.5–4.6)

## 2024-02-11 LAB — OSMOLALITY: Osmolality: 287 mosm/kg (ref 275–295)

## 2024-02-11 MED ORDER — FAMOTIDINE 20 MG PO TABS
20.0000 mg | ORAL_TABLET | Freq: Two times a day (BID) | ORAL | Status: DC
  Administered 2024-02-11: 20 mg via ORAL
  Filled 2024-02-11: qty 1

## 2024-02-11 MED ORDER — LACTATED RINGERS IV SOLN: INTRAVENOUS | Status: AC

## 2024-02-11 MED ORDER — DULOXETINE HCL 30 MG PO CPEP
30.0000 mg | ORAL_CAPSULE | Freq: Every day | ORAL | Status: DC
  Administered 2024-02-11: 30 mg via ORAL
  Filled 2024-02-11: qty 1

## 2024-02-11 MED ORDER — ONDANSETRON HCL 4 MG/2ML IJ SOLN: 4.0000 mg | Freq: Four times a day (QID) | INTRAMUSCULAR | Status: DC | PRN

## 2024-02-11 MED ORDER — ONDANSETRON HCL 4 MG PO TABS: 4.0000 mg | ORAL_TABLET | Freq: Four times a day (QID) | ORAL | Status: DC | PRN

## 2024-02-11 MED ORDER — ONDANSETRON HCL 4 MG PO TABS: 4.0000 mg | ORAL_TABLET | Freq: Four times a day (QID) | ORAL | 0 refills | Status: AC | PRN

## 2024-02-11 MED ORDER — AMLODIPINE BESYLATE 5 MG PO TABS
2.5000 mg | ORAL_TABLET | Freq: Every day | ORAL | Status: DC
  Administered 2024-02-11: 2.5 mg via ORAL
  Filled 2024-02-11: qty 1

## 2024-02-11 MED ORDER — AMOXICILLIN-POT CLAVULANATE 875-125 MG PO TABS: 1.0000 | ORAL_TABLET | Freq: Two times a day (BID) | ORAL | 0 refills | Status: AC

## 2024-02-11 MED ORDER — LEVOTHYROXINE SODIUM 50 MCG PO TABS
50.0000 ug | ORAL_TABLET | Freq: Every day | ORAL | Status: DC
  Administered 2024-02-11: 50 ug via ORAL
  Filled 2024-02-11: qty 1

## 2024-02-11 MED ORDER — IPRATROPIUM BROMIDE 0.06 % NA SOLN
1.0000 | Freq: Four times a day (QID) | NASAL | Status: DC
  Filled 2024-02-11: qty 15

## 2024-02-11 MED ORDER — MELATONIN 3 MG PO TABS: 6.0000 mg | ORAL_TABLET | Freq: Every evening | ORAL | Status: DC | PRN

## 2024-02-11 MED ORDER — LINACLOTIDE 145 MCG PO CAPS
290.0000 ug | ORAL_CAPSULE | Freq: Every day | ORAL | Status: DC
  Administered 2024-02-11: 290 ug via ORAL
  Filled 2024-02-11: qty 2

## 2024-02-11 MED ORDER — ACETAMINOPHEN 325 MG PO TABS: 650.0000 mg | ORAL_TABLET | Freq: Four times a day (QID) | ORAL | Status: DC | PRN

## 2024-02-11 MED ORDER — POTASSIUM CHLORIDE CRYS ER 20 MEQ PO TBCR
40.0000 meq | EXTENDED_RELEASE_TABLET | Freq: Once | ORAL | Status: AC
  Administered 2024-02-11: 40 meq via ORAL
  Filled 2024-02-11: qty 2

## 2024-02-11 MED ORDER — ENOXAPARIN SODIUM 40 MG/0.4ML IJ SOSY
40.0000 mg | PREFILLED_SYRINGE | INTRAMUSCULAR | Status: DC
  Filled 2024-02-11: qty 0.4

## 2024-02-11 MED ORDER — PIPERACILLIN-TAZOBACTAM 3.375 G IVPB: 3.3750 g | Freq: Three times a day (TID) | INTRAVENOUS | Status: DC

## 2024-02-11 MED ORDER — ACETAMINOPHEN 650 MG RE SUPP: 650.0000 mg | Freq: Four times a day (QID) | RECTAL | Status: DC | PRN

## 2024-02-11 MED ORDER — BUSPIRONE HCL 5 MG PO TABS
5.0000 mg | ORAL_TABLET | Freq: Two times a day (BID) | ORAL | Status: DC
  Administered 2024-02-11 (×2): 5 mg via ORAL
  Filled 2024-02-11 (×2): qty 1

## 2024-02-11 MED ORDER — ALBUTEROL SULFATE HFA 108 (90 BASE) MCG/ACT IN AERS: 1.0000 | INHALATION_SPRAY | RESPIRATORY_TRACT | Status: DC | PRN

## 2024-02-11 MED ORDER — LEVOTHYROXINE SODIUM 50 MCG PO TABS: 50.0000 ug | ORAL_TABLET | Freq: Every day | ORAL | Status: DC

## 2024-02-11 MED ORDER — LOSARTAN POTASSIUM 25 MG PO TABS
100.0000 mg | ORAL_TABLET | Freq: Every day | ORAL | Status: DC
  Administered 2024-02-11: 100 mg via ORAL
  Filled 2024-02-11: qty 4

## 2024-02-11 MED ORDER — HYDROCODONE-ACETAMINOPHEN 7.5-325 MG PO TABS
1.0000 | ORAL_TABLET | Freq: Two times a day (BID) | ORAL | Status: DC | PRN
  Administered 2024-02-11: 1 via ORAL
  Filled 2024-02-11: qty 1

## 2024-02-11 NOTE — Discharge Summary (Signed)
 " Physician Discharge Summary   Patient: [name] MRN: [id number] DOB: 1940-12-07  Admit date:     02/10/2024  Discharge date: 02/11/2024  Discharge Physician: Carliss LELON Canales   PCP: Ransom Other, MD   Recommendations at discharge:    Pt to be discharged home.   If you experience worsening fever, chills, chest pain, shortness of breath, or other concerning symptoms, please call your PCP or go to the emergency department immediately.  Discharge Diagnoses: Principal Problem:   Colitis  Resolved Problems:   * No resolved hospital problems. *   Hospital Course:  83 y.o. female with medical history significant of hypertension, GERD, hypothyroidism, depression, anxiety who presents to the emergency department due to abdominal pain which started today.  She complained of 4 weeks of abdominal discomfort.  She states that she has been taking laxative due to constipation and she had some bowel movements today.  Abdominal pain was left-sided and associated with nausea without vomiting, chest pain, shortness of breath, fever, chills.   ED course In the emergency department, temperature was 67F, BP 136/98, but other vital signs were within normal range.  Workup in ED showed normal CBC except for WBC of 17.7.  BMP sodium 122, potassium 3.0, chloride 93, bicarb 20, blood glucose 140, calcium 10.7.  Urinalysis was normal. CT abdomen pelvis with contrast was suggestive of colitis. She was treated with IV ceftriaxone  and Flagyl .  Dilaudid  was given.  Assessment and Plan:  Acute colitis - CT noting inflammation in ascending and sigmoid colon.  Leukocytosis on presentation.  Initiated on broad-spectrum antibiotic coverage, IV hydration, as needed antiemetics.  Showed marked improvement in symptoms.  Leukocytosis resolved.  Patient tolerating advancement in diet.  Will transition patient to p.o. Augmentin  to take for the next 7 days upon discharge.  Hypokalemia/hyponatremia/hypercalcemia -  Secondary to volume depletion.  Resolved after IV fluid hydration.  Hypertension - Resume home medication regiment.  Depression/anxiety/chronic pain Resume home regimen   Consultants: None Procedures performed: None Disposition: Home Diet recommendation:  Regular diet  DISCHARGE MEDICATION: Allergies as of 02/11/2024       Reactions   Crestor [rosuvastatin Calcium]    myalgia   Zocor [simvastatin]    myalgia   Enalapril    swelling   Lamictal [lamotrigine]    rash   Nitrofuran Derivatives Nausea Only   Erythromycin Other (See Comments)   Stomach burning   Methylprednisolone     Major depression, weakness, nausea   Nystatin  Other (See Comments)   Blisters on her lips   Other    Allergic to certain manufacturers for Hydrocodone  (Tris & Mallencrot) causes her to feel suicidal   Vivelle [estradiol]    rash   Zetia [ezetimibe]    Muscle weakness   Ciprofloxacin Nausea Only   Ciprofloxacin Hcl    rash   Latex Rash   Shellfish Allergy Rash   Sulfa Antibiotics Nausea Only   Sulfasalazine Nausea Only        Medication List     TAKE these medications    albuterol  108 (90 Base) MCG/ACT inhaler Commonly known as: VENTOLIN  HFA Inhale 1 puff into the lungs as needed.   amLODipine  2.5 MG tablet Commonly known as: NORVASC  2.5 mg.   amoxicillin -clavulanate 875-125 MG tablet Commonly known as: AUGMENTIN  Take 1 tablet by mouth 2 (two) times daily.   aspirin  EC 81 MG tablet Take 81 mg by mouth daily.   bismuth subsalicylate 262 MG/15ML suspension Commonly known as: PEPTO BISMOL Take  30 mLs by mouth every 6 (six) hours as needed.   busPIRone  5 MG tablet Commonly known as: BUSPAR  Take 5 mg by mouth 2 (two) times daily.   Co Q-10 100 MG Caps Take 100 mg by mouth daily.   dicyclomine  10 MG capsule Commonly known as: BENTYL  Take 1 capsule (10 mg total) by mouth every 8 (eight) hours as needed for spasms.   DULoxetine  30 MG capsule Commonly known as:  CYMBALTA  Take 30 mg by mouth daily.   ergocalciferol 1.25 MG (50000 UT) capsule Commonly known as: VITAMIN D2 Take 50,000 Units by mouth once a week.   famotidine  20 MG tablet Commonly known as: PEPCID  TAKE 1 TABLET BY MOUTH TWICE A DAY   HYDROcodone -acetaminophen  7.5-325 MG tablet Commonly known as: NORCO Take 1 tablet by mouth 2 (two) times daily as needed for moderate pain (pain score 4-6).   hyoscyamine  0.125 MG SL tablet Commonly known as: LEVSIN  SL TAKE 1 TABLET (0.125 MG TOTAL) BY MOUTH EVERY 12 (TWELVE) HOURS AS NEEDED.   ipratropium 0.06 % nasal spray Commonly known as: ATROVENT  Place into both nostrils as needed.   levothyroxine  50 MCG tablet Commonly known as: SYNTHROID  Take 50 mcg by mouth daily before breakfast. Once daily   Linzess  290 MCG Caps capsule Generic drug: linaclotide  TAKE 1 CAPSULE BY MOUTH DAILY BEFORE BREAKFAST.   LORazepam  1 MG tablet Commonly known as: ATIVAN  Take 1 tablet by mouth at bedtime.   losartan  100 MG tablet Commonly known as: COZAAR  Take 100 mg by mouth daily.   ondansetron  4 MG tablet Commonly known as: ZOFRAN  Take 1 tablet (4 mg total) by mouth every 6 (six) hours as needed for nausea. What changed:  when to take this reasons to take this   SUMAtriptan  50 MG tablet Commonly known as: IMITREX  Take 50 mg by mouth as needed. For migraine   tizanidine 2 MG capsule Commonly known as: ZANAFLEX TAKE 1 CAPSULE BY MOUTH THREE TIMES A DAY AS NEEDED; Duration: 10   triamcinolone  0.025 % ointment Commonly known as: KENALOG  Apply 1 application topically 2 (two) times daily.         Discharge Exam: Filed Weights   02/10/24 1827  Weight: 61.2 kg    GENERAL:  Alert, pleasant, no acute distress  HEENT:  EOMI CARDIOVASCULAR:  RRR, no murmurs appreciated RESPIRATORY:  Clear to auscultation, no wheezing, rales, or rhonchi GASTROINTESTINAL:  Soft, nontender, nondistended EXTREMITIES:  No LE edema bilaterally NEURO:  No  new focal deficits appreciated SKIN:  No rashes noted PSYCH:  Appropriate mood and affect     Condition at discharge: improving  The results of significant diagnostics from this hospitalization (including imaging, microbiology, ancillary and laboratory) are listed below for reference.   Imaging Studies: CT ABDOMEN PELVIS W CONTRAST Result Date: 02/10/2024 EXAM: CT ABDOMEN AND PELVIS WITH CONTRAST 02/10/2024 10:01:25 PM TECHNIQUE: CT of the abdomen and pelvis was performed with the administration of intravenous contrast. 80 mL of iohexol  (OMNIPAQUE ) 300 MG/ML solution 100 mL IOHEXOL  300 MG/ML SOLN was administered. Multiplanar reformatted images are provided for review. Automated exposure control, iterative reconstruction, and/or weight-based adjustment of the mA/kV was utilized to reduce the radiation dose to as low as reasonably achievable. COMPARISON: MRI abdomen without contrast 01/10/2023, CT with iv contrast 12/17/2016, and CT from 05/22/2013. CLINICAL HISTORY: Abdominal pain, acute, nonlocalized. FINDINGS: LOWER CHEST: There is scattered linear scarring or atelectasis in the lung bases, without infiltrates. LIVER: The liver is unremarkable apart from a 7  mm cyst in the tip of segment 5. There is no mass. GALLBLADDER AND BILE DUCTS: Gallbladder is unremarkable. No biliary ductal dilatation. SPLEEN: No acute abnormality. PANCREAS: There is a chronic cystic lesion in the inferior aspect of the pancreatic body/tail segment left of the midline again measuring 2 x 1.2 cm, stable. This is also not notably changed compared to a CT from 05/22/2013. This is consistent with a side branch IPMN. No further follow-up imaging is recommended. ADRENAL GLANDS: No acute abnormality. There is no adrenal mass. KIDNEYS, URETERS AND BLADDER: There is chronic cortical thinning in both kidneys and numerous tiny Bosniak 2 cortical cysts, and a few small Bosniak 1 cysts on the right. No follow-up imaging is recommended.  There is no mass enhancement. No stones in the kidneys or ureters. No hydronephrosis. No perinephric or periureteral stranding. The bladder is mostly obscured due to metal artifact from bilateral hip replacements, but it is unremarkable where visible. GI AND BOWEL: Stomach demonstrates no acute abnormality. There is no small bowel obstruction. An appendix is not seen in this patient. There is a mobile cecum in the right pelvis. There are thickened folds with inflammatory changes in the distal transverse colon, the descending and sigmoid colon consistent with a long segment colitis, likely infectious or inflammatory as there is normal opacification of the SMA, SMV, and branches. There is no pneumatosis. The more proximal colon wall is normal in thickness. PERITONEUM AND RETROPERITONEUM: No ascites. No free air. There is a small fat-containing umbilical hernia. VASCULATURE: Aorta is normal in caliber. There is moderate aortoiliac calcific plaque without aneurysm. Normal opacification of the SMA, SMV, and branches. LYMPH NODES: No lymphadenopathy. REPRODUCTIVE ORGANS: The uterus is absent. No adnexal mass. BONES AND SOFT TISSUES: Osteopenia and degenerative change lumbar spine. Slight lumbar spine scoliosis. No acute osseous abnormality. No focal soft tissue abnormality. IMPRESSION: 1. Long segment colitis involving the distal transverse, descending, and sigmoid colon, likely infectious or inflammatory. No pneumatosis. 2. Side branch ipmn in the pancreas. Stable over multiple years. No further follow-up imaging recommended. 3. Umbilical fat hernia. Electronically signed by: Francis Quam MD 02/10/2024 11:18 PM EST RP Workstation: [id number]    Microbiology: No results found for this or any previous visit.  Labs: CBC: Recent Labs  Lab 02/10/24 1833 02/11/24 0616  WBC 17.7* 7.7  HGB 15.0 11.6*  HCT 43.4 33.7*  MCV 97.7 97.1  PLT 262 189   Basic Metabolic Panel: Recent Labs  Lab 02/10/24 1833  02/11/24 0616  NA 132* 134*  K 3.0* 3.8  CL 93* 99  CO2 20* 27  GLUCOSE 140* 99  BUN 16 16  CREATININE 0.99 0.98  CALCIUM 10.7* 9.3  MG  --  2.1  PHOS  --  2.5   Liver Function Tests: Recent Labs  Lab 02/10/24 1833 02/11/24 0616  AST 24 20  ALT 11 9  ALKPHOS 88 68  BILITOT 0.5 0.4  PROT 7.5 5.9*  ALBUMIN 4.5 3.7   CBG: No results for input(s): GLUCAP in the last 168 hours.  Discharge time spent: 24 minutes.  Length of inpatient stay: 1 days  Signed: Carliss LELON Canales, DO [facility] 02/11/2024         "

## 2024-02-11 NOTE — ED Notes (Signed)
 Patient concerned about needing to take all of her home meds, mainly her BP meds.  Patient assured that admitting team was reviewing her meds and would be putting in orders, but patient states she's really concerned about needing to take her BP meds.  Dr. Adefeso made aware via secure chat.

## 2024-02-11 NOTE — Progress Notes (Signed)
 Inpatient Care Manager [facility]) has reviewed patient and no ICM needs have been identified at this time. However, IPCM team will continue to follow along and monitor patient advancement through interdisciplinary progression rounds. If new patient transition needs arise, please enter a ICM consult to prompt IPCM team to follow up.     02/11/24 1018  TOC Brief Assessment  Insurance and Status Reviewed  Patient has primary care physician Yes  Home environment has been reviewed Home with Spouse  Prior level of function: Independent  Prior/Current Home Services No current home services  Social Drivers of Health Review SDOH reviewed no interventions necessary  Readmission risk has been reviewed Yes  Transition of care needs no transition of care needs at this time

## 2024-02-11 NOTE — Care Management CC44 (Signed)
"         Condition Code 44 Documentation Completed  Patient Details  Name: PHYLIS JAVED MRN: [id number] Date of Birth: January 13, 1941   Condition Code 44 given:  Yes Patient signature on Condition Code 44 notice:  Yes Documentation of 2 MD's agreement:  Yes Code 44 added to claim:  Yes    Ronnald MARLA Sil, RN 02/11/2024, 10:11 AM  "

## 2024-02-11 NOTE — Care Management Obs Status (Signed)
 MEDICARE OBSERVATION STATUS NOTIFICATION   Patient Details  Name: [name] MRN: [id number] Date of Birth: 07-07-1940   Medicare Observation Status Notification Given:  Yes    Ronnald MARLA Sil, RN 02/11/2024, 10:11 AM

## 2024-02-11 NOTE — ED Notes (Signed)
 Pt stated that she has been unable to sleep this night. Pt states that the ambience of the ED has not allowed her to catch one wink of sleep. She is hoping that she will be going home today.

## 2024-02-12 ENCOUNTER — Telehealth: Payer: Self-pay | Admitting: Gastroenterology

## 2024-02-12 NOTE — Telephone Encounter (Signed)
 Inbound call from patient stating that she would like to speak to beth in regards to her recent ED visit and what was done and discussed. Patient did not have a good experience at the ED and is unsure weather Dr. Shila was able to see everything that was done to her during her ED visit. Patient is also wanting to know what he possible treatment options would be. Patient is requesting a call back. Please advise.

## 2024-02-13 NOTE — Telephone Encounter (Addendum)
 Diagnosed with colitis-given IV Flagyl . Patient reports improvement. Will continue a soft diet for a few more day and gradually return to her regular foods.  Patient last seen in office May 2025. Follow up appointment scheduled for the patient in February 2026.

## 2024-02-26 ENCOUNTER — Telehealth: Payer: Self-pay | Admitting: Gastroenterology

## 2024-02-26 NOTE — Telephone Encounter (Signed)
 Inbound call from patient stating she was recently released from the hospital and has not been able to have a bowel movement in over 5 days would like to speak to nurse and be advised on what to do Please advise  Thank you

## 2024-02-27 NOTE — Telephone Encounter (Signed)
 Patent is feeling constipated and uncomfortable. She wants to know if she can take her usual remedy of senna tablets and Colace. Afebrile. No red flag signs like LLQ pain. Okay to take usual regimen, continue to advance diet as tolerated. Keep follow up appointment.

## 2024-03-01 NOTE — Telephone Encounter (Signed)
 PT is calling to get an update on what she can do for being constipated. She stated she has tried everything and still very uncomfortable. Requesting to speak further about her condition.

## 2024-03-06 NOTE — Telephone Encounter (Signed)
 Offered a sooner appointment. Patient declines.

## 2024-03-08 ENCOUNTER — Other Ambulatory Visit: Payer: Self-pay | Admitting: Nurse Practitioner

## 2024-03-08 ENCOUNTER — Telehealth: Payer: Self-pay | Admitting: Gastroenterology

## 2024-03-08 MED ORDER — DICYCLOMINE HCL 10 MG PO CAPS: 10.0000 mg | ORAL_CAPSULE | Freq: Three times a day (TID) | ORAL | 0 refills | Status: DC | PRN

## 2024-03-08 NOTE — Telephone Encounter (Signed)
 Patient called stating she is having a colitis flare up. Requesting to know if a medication can be sent in before the weekend. Please advise, thank you.

## 2024-03-08 NOTE — Telephone Encounter (Signed)
 Patient reports that she has some abdominal upset and a little pain but no diarrhea but feel like it could come anytime.  She reports she is worried about the upcoming winter storm.  We discussed that she can take dicyclomine  as prescribed.  She reports that she is out, I sent a refill.  She will try and low residue diet hold her linzess  for 48 hours. She will call back for any additional questions or concerns.

## 2024-03-08 NOTE — Telephone Encounter (Signed)
 Patient is aware of all below

## 2024-03-08 NOTE — Telephone Encounter (Signed)
 Left message for patient to call back

## 2024-03-08 NOTE — Telephone Encounter (Signed)
 [name] routed prescription refill to me for dicyclomine  since I most recently saw patient.  Looks like Dr. Albertus had prescribed hyoscyamine  back in September.  Please make sure patient is not still taking this.  Dicyclomine  does carry risk of constipation.  She is at increased risk of taking this medication given her age.  She understands these risks I am okay to sign that short-term prescription but she cannot take both dicyclomine  and hyoscyamine .

## 2024-03-12 ENCOUNTER — Ambulatory Visit: Admitting: Gastroenterology

## 2024-03-27 ENCOUNTER — Ambulatory Visit: Admitting: Gastroenterology
